# Patient Record
Sex: Female | Born: 1965 | Hispanic: No | Marital: Married | Smoking: Never smoker
Health system: Southern US, Community
[De-identification: ages and names within clinical notes are randomized; demographics above are authoritative.]

## PROBLEM LIST (undated history)

## (undated) DIAGNOSIS — K5090 Crohn's disease, unspecified, without complications: Secondary | ICD-10-CM

## (undated) DIAGNOSIS — R7303 Prediabetes: Secondary | ICD-10-CM

## (undated) DIAGNOSIS — K219 Gastro-esophageal reflux disease without esophagitis: Secondary | ICD-10-CM

## (undated) DIAGNOSIS — Z973 Presence of spectacles and contact lenses: Secondary | ICD-10-CM

## (undated) DIAGNOSIS — M8580 Other specified disorders of bone density and structure, unspecified site: Secondary | ICD-10-CM

## (undated) DIAGNOSIS — G43909 Migraine, unspecified, not intractable, without status migrainosus: Secondary | ICD-10-CM

## (undated) HISTORY — DX: Other specified disorders of bone density and structure, unspecified site: M85.80

## (undated) HISTORY — DX: Crohn's disease, unspecified, without complications: K50.90

## (undated) HISTORY — DX: Migraine, unspecified, not intractable, without status migrainosus: G43.909

---

## 2000-02-01 ENCOUNTER — Other Ambulatory Visit: Admission: RE | Admit: 2000-02-01 | Discharge: 2000-02-01 | Payer: Self-pay | Admitting: Obstetrics and Gynecology

## 2001-02-09 ENCOUNTER — Other Ambulatory Visit: Admission: RE | Admit: 2001-02-09 | Discharge: 2001-02-09 | Payer: Self-pay | Admitting: Obstetrics and Gynecology

## 2002-03-14 ENCOUNTER — Other Ambulatory Visit: Admission: RE | Admit: 2002-03-14 | Discharge: 2002-03-14 | Payer: Self-pay | Admitting: Obstetrics and Gynecology

## 2003-03-18 ENCOUNTER — Other Ambulatory Visit: Admission: RE | Admit: 2003-03-18 | Discharge: 2003-03-18 | Payer: Self-pay | Admitting: Obstetrics and Gynecology

## 2004-03-23 ENCOUNTER — Other Ambulatory Visit: Admission: RE | Admit: 2004-03-23 | Discharge: 2004-03-23 | Payer: Self-pay | Admitting: Obstetrics and Gynecology

## 2005-04-18 ENCOUNTER — Encounter: Admission: RE | Admit: 2005-04-18 | Discharge: 2005-07-17 | Payer: Self-pay | Admitting: Obstetrics and Gynecology

## 2005-10-11 ENCOUNTER — Inpatient Hospital Stay (HOSPITAL_COMMUNITY): Admission: AD | Admit: 2005-10-11 | Discharge: 2005-10-11 | Payer: Self-pay | Admitting: Obstetrics and Gynecology

## 2005-10-12 ENCOUNTER — Inpatient Hospital Stay (HOSPITAL_COMMUNITY): Admission: AD | Admit: 2005-10-12 | Discharge: 2005-10-14 | Payer: Self-pay | Admitting: Obstetrics & Gynecology

## 2005-10-18 ENCOUNTER — Inpatient Hospital Stay (HOSPITAL_COMMUNITY): Admission: AD | Admit: 2005-10-18 | Discharge: 2005-10-18 | Payer: Self-pay | Admitting: Obstetrics and Gynecology

## 2005-10-28 ENCOUNTER — Inpatient Hospital Stay (HOSPITAL_COMMUNITY): Admission: AD | Admit: 2005-10-28 | Discharge: 2005-10-31 | Payer: Self-pay | Admitting: Obstetrics and Gynecology

## 2006-03-07 ENCOUNTER — Ambulatory Visit: Payer: Self-pay

## 2006-06-12 ENCOUNTER — Ambulatory Visit: Payer: Self-pay | Admitting: Unknown Physician Specialty

## 2007-01-02 IMAGING — US US FETAL BPP W/O NONSTRESS
1 series · 13 of 28 positions shown · non-contrast
Comparison: none

CLINICAL DATA: High blood pressure.  Nonreactive NST.  Assess estimated fetal weight and fetal well-being.  G3 P0 TAB2.  Patient is 36 weeks 5 days by LMP dating.

[Series 1: us fetal bpp w/o nonstress · 0.29mm/px · 35 acquisitions, 13 frames shown]
[im 2/35]
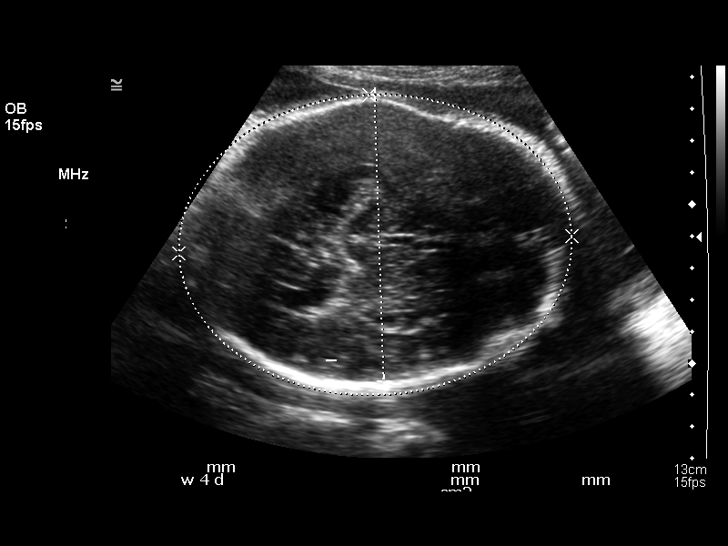
[im 4/35]
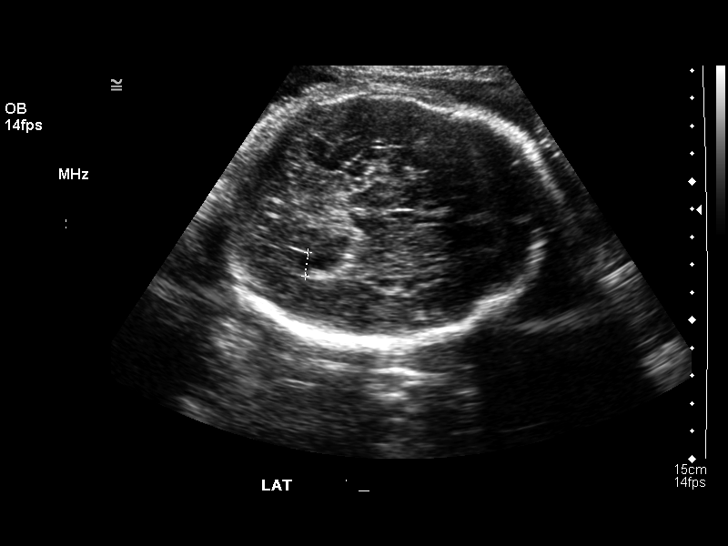
[im 7/35]
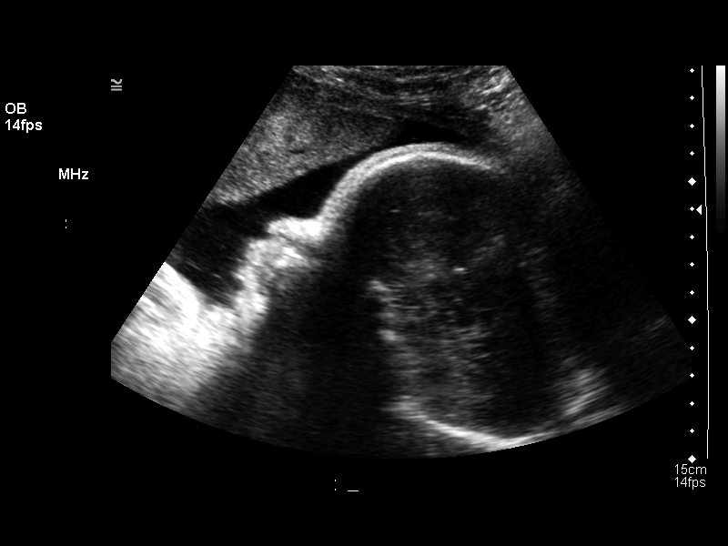
[im 9/35]
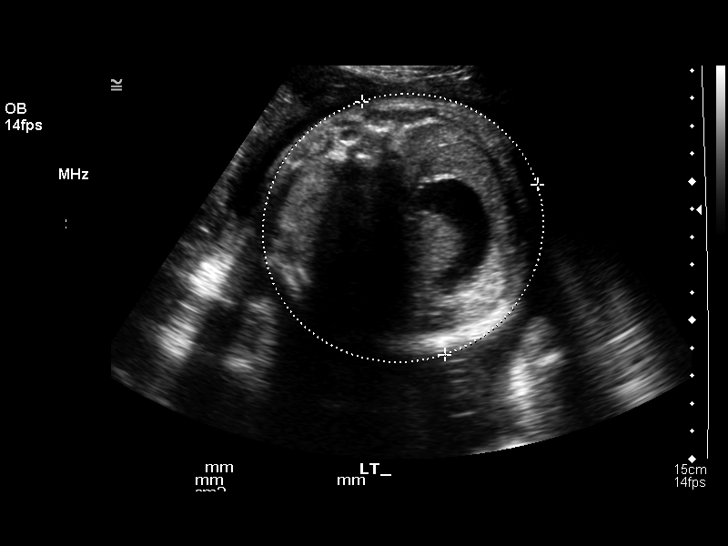
[im 12/35]
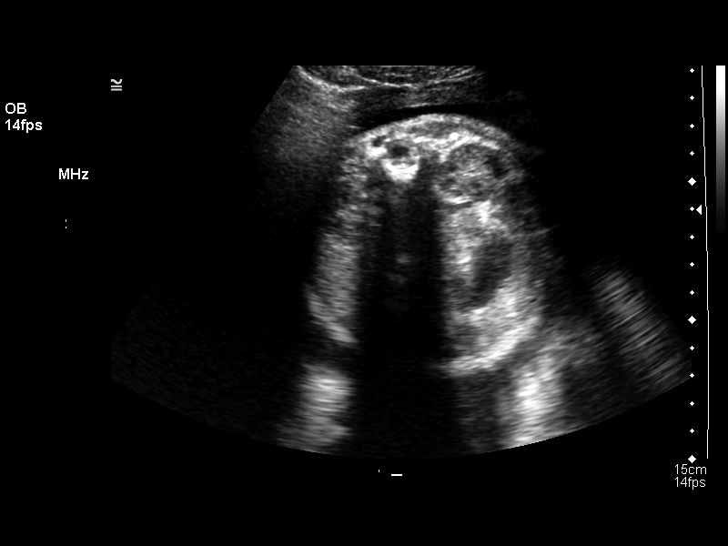
[im 14/35]
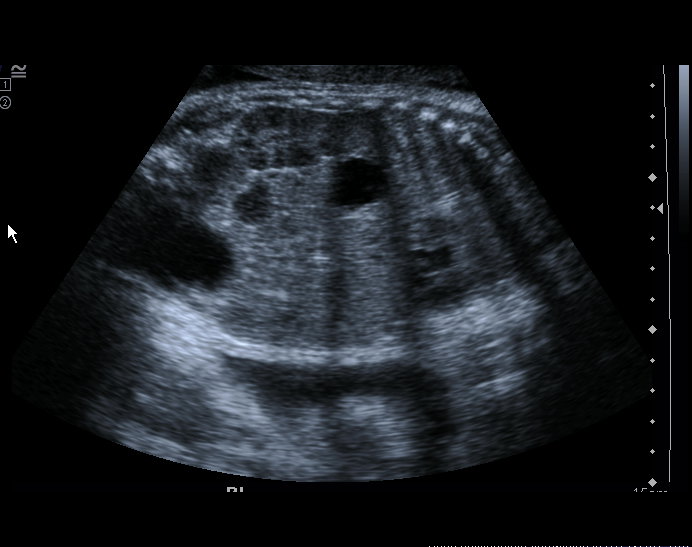
[im 18/35]
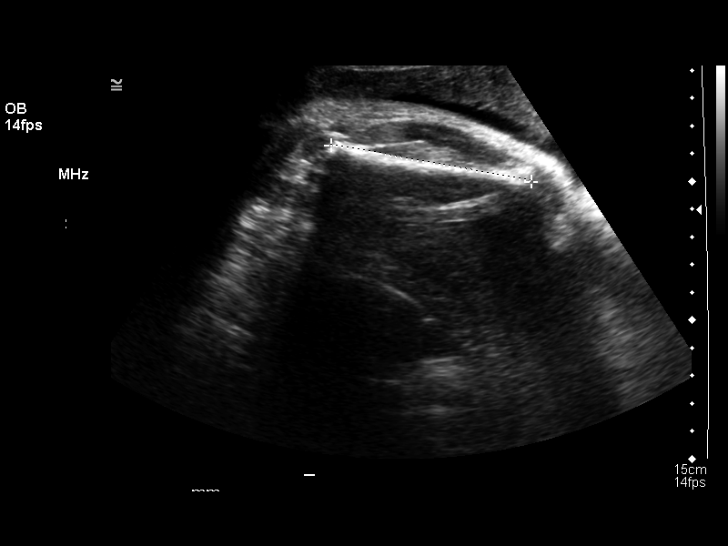
[im 21/35]
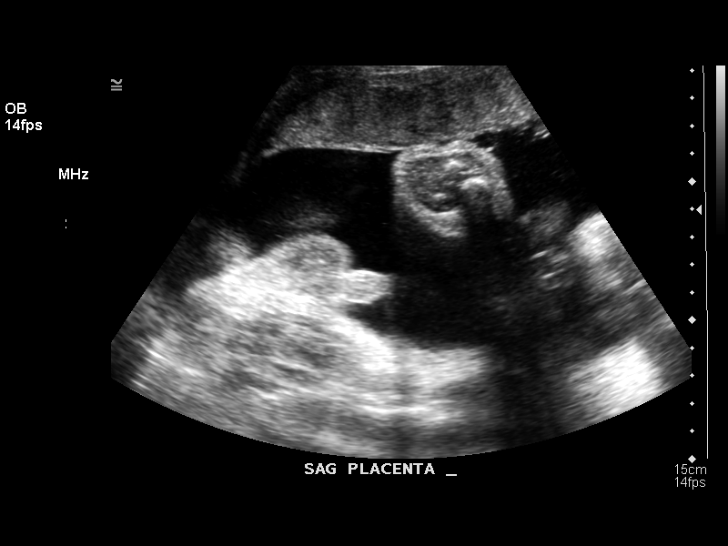
[im 23/35]
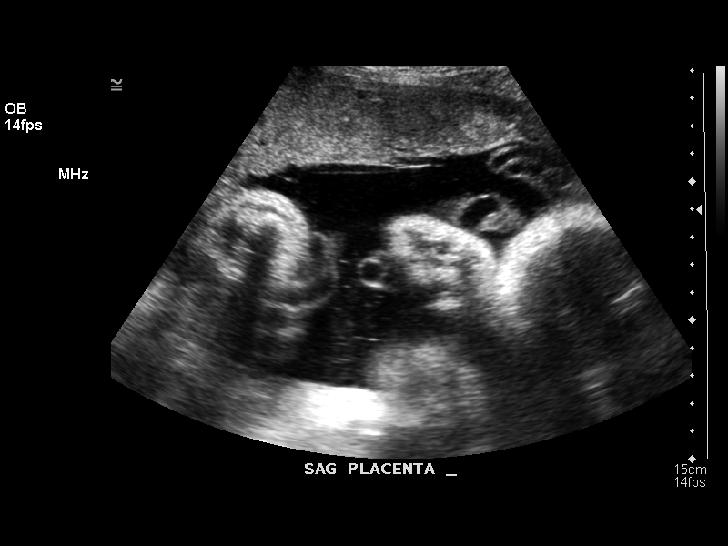
[im 26/35]
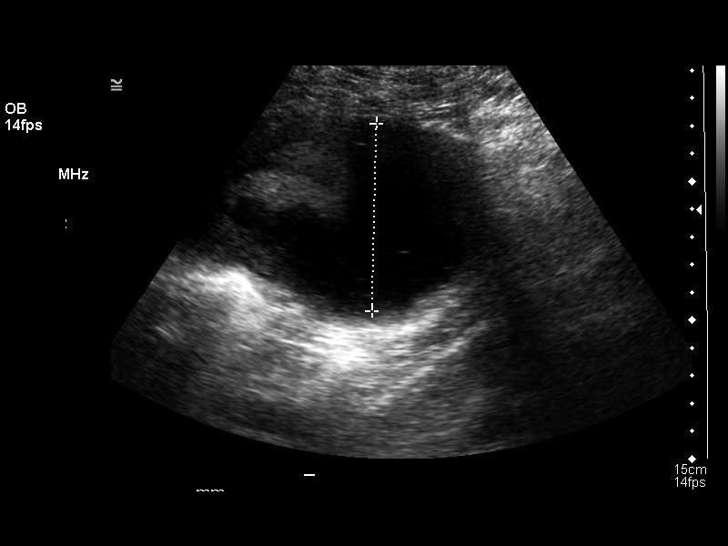
[im 28/35]
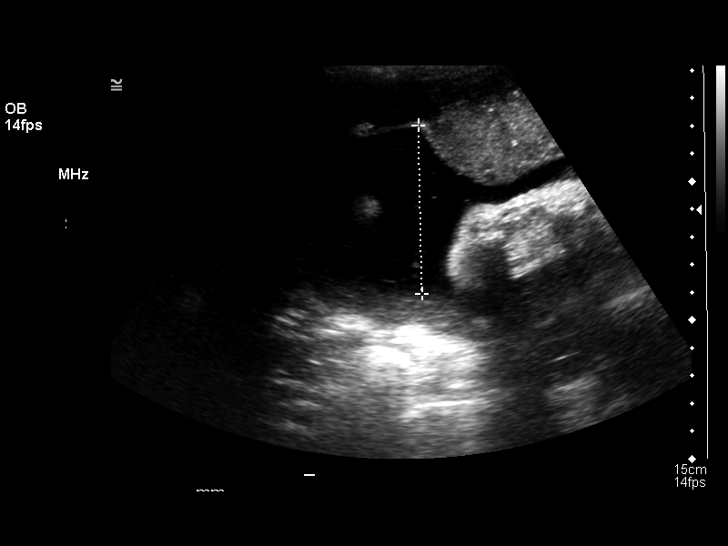
[im 31/35]
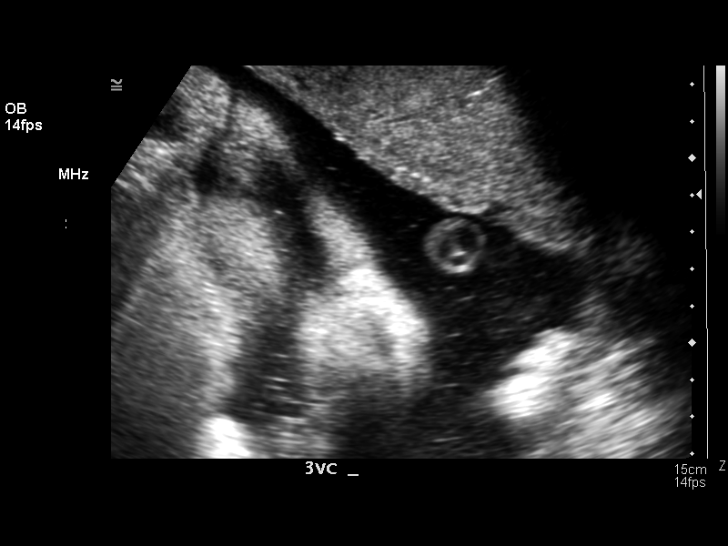
[im 33/35]
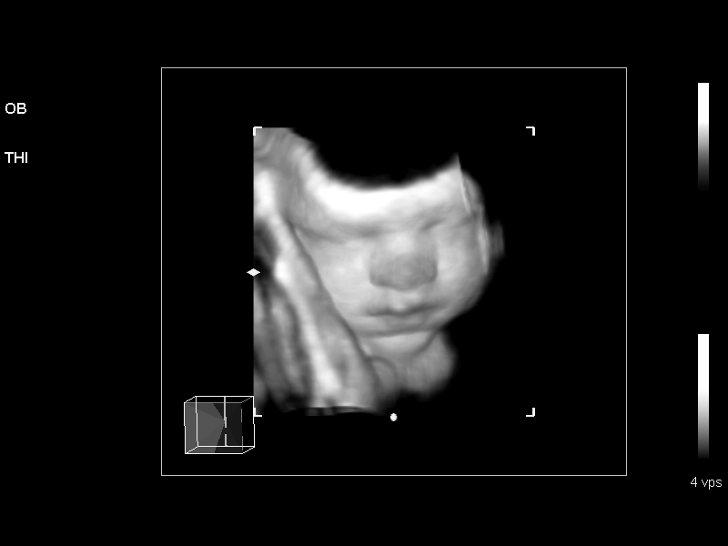

[13 of 28 positions shown; findings below may reference images not displayed]

OBSTETRICAL ULTRASOUND:
Number of Fetuses:  1
Heart Rate:  136
Movement:  Yes
Breathing:  Yes  
Presentation: Cephalic
Placental Location:  Anterior
Grade:  II
Previa: No
Amniotic Fluid (Subjective):  Upper normal
Amniotic Fluid (Objective):   23.1 cm AFI (5th -95th%ile = 7.5 ? 24.4 cm for 37 wks)

FETAL BIOMETRY
BPD:  9.1 cm   36 w 6 d
HC:  34.4 cm   34 w 6 d
AC:  31.5 cm   35 w 3 d
FL:  7.5 cm  38 w 2 d

MEAN GA:  37 w 4 d
LMP GA:                       36 w 5 d (assigned)

EFW: 8204 g (H) 50th ? 75th%ile (4882 ? 3499 g) For 37 wks

FETAL ANATOMY
Lateral Ventricles:    Visualized 
Thalami/CSP:      Visualized 
Posterior Fossa:  Not visualized 
Nuchal Region:    N/A
Spine:      Not visualized 
4 Chamber Heart on Left:      Not visualized 
Stomach on Left:      Visualized 
3 Vessel Cord:    Visualized 
Cord Insertion site:    Not visualized 
Kidneys:  Visualized 
Bladder:  Visualized 
Extremities:      Not visualized 

ADDITIONAL ANATOMY VISUALIZED:  Diaphragm.
Evaluation limited by:  Advanced gestational age and fetal positioning.

MATERNAL UTERINE AND ADNEXAL FINDINGS
Cervix:   Not evaluated.
IMPRESSION: 1.  Single living intrauterine fetus in cephalic presentation.  Patient is 36 weeks 5 days by LMP dating and measures 37 weeks 4 days today with a fetal weight of 8204 grams, falling between the 50th ? 75th percentile for 37 weeks.  Growth is appropriate.
2.  Upper normal amniotic fluid volume subjectively, with AFI of 23.1 cm.
3.  Limited anatomic survey due to late gestational age and fetal positioning.
BIOPHYSICAL PROFILE

Movement:  2    Time:  5 minutes
Breathing:  2
Tone:  2
Amniotic Fluid:  2

Total Score:  8
IMPRESSION: Biophysical profile score is [DATE] in 5 minutes of scanning.

## 2007-08-04 ENCOUNTER — Inpatient Hospital Stay (HOSPITAL_COMMUNITY): Admission: AD | Admit: 2007-08-04 | Discharge: 2007-08-06 | Payer: Self-pay | Admitting: Obstetrics and Gynecology

## 2007-12-27 HISTORY — PX: CHOLECYSTECTOMY: SHX55

## 2008-07-14 ENCOUNTER — Ambulatory Visit: Payer: Self-pay | Admitting: Gastroenterology

## 2008-07-16 ENCOUNTER — Ambulatory Visit: Payer: Self-pay | Admitting: Gastroenterology

## 2008-07-22 ENCOUNTER — Ambulatory Visit: Payer: Self-pay | Admitting: General Surgery

## 2008-07-29 ENCOUNTER — Ambulatory Visit: Payer: Self-pay | Admitting: General Surgery

## 2008-08-09 ENCOUNTER — Ambulatory Visit: Payer: Self-pay | Admitting: Surgery

## 2008-08-11 ENCOUNTER — Ambulatory Visit: Payer: Self-pay | Admitting: General Surgery

## 2008-10-14 ENCOUNTER — Ambulatory Visit: Payer: Self-pay | Admitting: Obstetrics and Gynecology

## 2008-10-16 ENCOUNTER — Ambulatory Visit: Payer: Self-pay | Admitting: Obstetrics and Gynecology

## 2008-12-23 ENCOUNTER — Ambulatory Visit: Payer: Self-pay | Admitting: Internal Medicine

## 2009-05-21 ENCOUNTER — Ambulatory Visit: Payer: Self-pay | Admitting: Unknown Physician Specialty

## 2009-10-22 ENCOUNTER — Ambulatory Visit: Payer: Self-pay | Admitting: Obstetrics and Gynecology

## 2010-10-28 ENCOUNTER — Ambulatory Visit: Payer: Self-pay | Admitting: Obstetrics and Gynecology

## 2010-11-08 ENCOUNTER — Other Ambulatory Visit: Payer: Self-pay | Admitting: Unknown Physician Specialty

## 2011-05-13 NOTE — Discharge Summary (Signed)
NAME:  Jennifer Sims, Jennifer Sims        ACCOUNT NO.:  [id number]   MEDICAL RECORD NO.:  [id number]          PATIENT TYPE:  INP   LOCATION:  9153                          FACILITY:  [facility]   PHYSICIAN:  Servando Salina, M.D.DATE OF BIRTH:  Jan 20, 1966   DATE OF ADMISSION:  10/12/2005  DATE OF DISCHARGE:  10/14/2005                                 DISCHARGE SUMMARY   ADMISSION DIAGNOSES:  1.  Pregnancy-induced hypertension.  2.  Intrauterine gestation at 36-6/7 weeks.  3.  Group B strep culture positive.  4.  Crohn's disease.  5.  Chronic headache.   DISCHARGE DIAGNOSES:  1.  Pregnancy-induced hypertension.  2.  Crohn's disease.  3.  Intrauterine gestation at 37+ weeks, undelivered.  4.  Chronic headache.   HISTORY OF PRESENT ILLNESS:  A 45 year old, gravida 3, para 0-0-2-0, married  black female, LMP of January 26, 2005, [facility] of November 03, 2005, now at 31-  6/7 weeks admitted for blood pressure management.  The patient was found to  have a blood pressure of 141/106, 151/101.  The patient had had [place] labs  that were normal on October 17 and October 18.  Urine total protein was less  than 6 mg in a 24-hour period.  The patient had history of chronic headaches  but denied any visual changes or epigastric pain.  Her prenatal course had  been unremarkable. Ultrasound at 36-5/7 weeks gave an estimated fetal weight  of 3072 g and upper normal amniotic fluid index.  The patient's blood type  is A positive.  She is rubella immune and her amniocentesis was normal  chromosomes.   HOSPITAL COURSE:  The patient was admitted to [facility].  She was  placed on continuous fetal monitoring.  Labetalol medication was used.  [place]  labs were noted on October 18 to show a platelet count of 244,000,  hematocrit 37.2, uric acid 4.8.  Creatinine-clearance was 140 ml/minute.  Urine total protein was less than 6.  The patient noted decrease in leg  swelling during her hospitalization.  [place] labs x2 had  been normal.  Therefore, were not repeated.  She was given labetalol 10 mg IV on initial  presentation and started on oral labetalol.  By hospital day #2/3, blood  pressures were stable on labetalol 100 mg p.o. b.i.d. Her extremities had  trace edema.  Her tracing was reactive with some irregular contractions.  Cervix had been long and closed.  It was thought that the patient could be  managed on an outpatient basis, and therefore, was discharged to home.   DISPOSITION:  Home.   CONDITION ON DISCHARGE:  Stable.   DISCHARGE MEDICATIONS:  1.  Asacol 800 mg daily.  2.  Prenatal vitamins one p.o. daily.  3.  Percocet 20 mg q.6h. p.r.n. pain.  4.  Flonase twice a day.  5.  Mucinex b.i.d.  6.  Labetolol 145m po bid  7.  ambien 10 mg po qhs prn   [facility]   DISCHARGE INSTRUCTIONS:  1.  PIH warning signs.  2.  Call for decreased fetal movement, spontaneous rupture of  membranes.  3.  Signs and symptoms of labor were discussed.      Servando Salina, M.D.  Electronically Signed     [place]/MED[facility]  D:  12/04/2005  T:  12/05/2005  Job:  [id number]

## 2011-10-10 LAB — CBC
HCT: 34.3 — ABNORMAL LOW
HCT: 34.9 — ABNORMAL LOW
HCT: 38.5
Hemoglobin: 11.6 — ABNORMAL LOW
Hemoglobin: 11.6 — ABNORMAL LOW
Hemoglobin: 13
MCHC: 33.2
MCHC: 33.8
MCHC: 33.9
MCV: 85.3
MCV: 86
MCV: 86.8
Platelets: 221
Platelets: 241
Platelets: 256
RBC: 3.99
RBC: 4.02
RBC: 4.51
RDW: 14.7 — ABNORMAL HIGH
RDW: 14.8 — ABNORMAL HIGH
RDW: 15 — ABNORMAL HIGH
WBC: 10.3
WBC: 7.4
WBC: 8.4

## 2011-10-10 LAB — COMPREHENSIVE METABOLIC PANEL
ALT: 12
ALT: 18
AST: 17
AST: 29
Albumin: 2.6 — ABNORMAL LOW
Albumin: 2.7 — ABNORMAL LOW
Alkaline Phosphatase: 118 — ABNORMAL HIGH
Alkaline Phosphatase: 89
BUN: 3 — ABNORMAL LOW
BUN: 5 — ABNORMAL LOW
CO2: 21
CO2: 28
Calcium: 8.5
Calcium: 8.9
Chloride: 104
Chloride: 107
Creatinine, Ser: 0.67
Creatinine, Ser: 0.71
GFR calc Af Amer: >60
GFR calc Af Amer: >60
GFR calc non Af Amer: >60
GFR calc non Af Amer: >60
Glucose, Bld: 90
Glucose, Bld: 96
Potassium: 3.7
Potassium: 3.8
Sodium: 135
Sodium: 135
Total Bilirubin: 0.4
Total Bilirubin: 0.6
Total Protein: 5.8 — ABNORMAL LOW
Total Protein: 6

## 2011-10-10 LAB — URIC ACID
Uric Acid, Serum: 5.4
Uric Acid, Serum: 5.5

## 2011-10-10 LAB — LACTATE DEHYDROGENASE
LDH: 120
LDH: 178

## 2011-10-10 LAB — URINALYSIS, DIPSTICK ONLY
Bilirubin Urine: NEGATIVE
Glucose, UA: NEGATIVE
Hgb urine dipstick: NEGATIVE
Ketones, ur: NEGATIVE
Leukocytes, UA: NEGATIVE
Nitrite: NEGATIVE
Protein, ur: NEGATIVE
Specific Gravity, Urine: <1.005 — ABNORMAL LOW
Urobilinogen, UA: 0.2
pH: 7

## 2011-10-10 LAB — CCBB MATERNAL DONOR DRAW

## 2011-10-10 LAB — RPR: RPR Ser Ql: NONREACTIVE

## 2011-12-21 ENCOUNTER — Ambulatory Visit: Payer: Self-pay | Admitting: Obstetrics and Gynecology

## 2012-12-11 ENCOUNTER — Ambulatory Visit: Payer: Self-pay | Admitting: Obstetrics and Gynecology

## 2012-12-12 ENCOUNTER — Ambulatory Visit: Payer: Self-pay | Admitting: Obstetrics and Gynecology

## 2013-05-24 ENCOUNTER — Ambulatory Visit: Payer: Self-pay | Admitting: Obstetrics and Gynecology

## 2013-12-12 ENCOUNTER — Ambulatory Visit: Payer: Self-pay | Admitting: Obstetrics and Gynecology

## 2015-06-11 DIAGNOSIS — K219 Gastro-esophageal reflux disease without esophagitis: Secondary | ICD-10-CM | POA: Insufficient documentation

## 2015-10-14 ENCOUNTER — Other Ambulatory Visit: Payer: Self-pay | Admitting: Neurology

## 2015-10-14 DIAGNOSIS — G43119 Migraine with aura, intractable, without status migrainosus: Secondary | ICD-10-CM

## 2015-10-26 ENCOUNTER — Ambulatory Visit: Payer: Self-pay

## 2015-11-05 ENCOUNTER — Ambulatory Visit
Admission: RE | Admit: 2015-11-05 | Discharge: 2015-11-05 | Disposition: A | Discharge status: Home / Self Care | Payer: BC Managed Care – PPO | Source: Ambulatory Visit | Attending: Neurology | Admitting: Neurology

## 2015-11-05 DIAGNOSIS — G43119 Migraine with aura, intractable, without status migrainosus: Secondary | ICD-10-CM | POA: Insufficient documentation

## 2015-11-05 MED ORDER — GADOBENATE DIMEGLUMINE 529 MG/ML IV SOLN
10.0000 mL | Freq: Once | INTRAVENOUS | Status: AC | PRN
  Administered 2015-11-05: 10 mL via INTRAVENOUS

## 2016-05-31 DIAGNOSIS — E559 Vitamin D deficiency, unspecified: Secondary | ICD-10-CM | POA: Insufficient documentation

## 2016-07-21 ENCOUNTER — Ambulatory Visit: Payer: BC Managed Care – PPO | Admitting: Neurology

## 2016-08-02 ENCOUNTER — Encounter: Payer: Self-pay | Admitting: Neurology

## 2016-08-02 ENCOUNTER — Ambulatory Visit: Payer: BC Managed Care – PPO | Admitting: Neurology

## 2016-08-02 VITALS — BP 122/86 | HR 82 | Ht 62.75 in | Wt 132.2 lb

## 2016-08-02 DIAGNOSIS — Z79899 Other long term (current) drug therapy: Secondary | ICD-10-CM

## 2016-08-02 DIAGNOSIS — G43709 Chronic migraine without aura, not intractable, without status migrainosus: Secondary | ICD-10-CM | POA: Diagnosis not present

## 2016-08-02 DIAGNOSIS — G43711 Chronic migraine without aura, intractable, with status migrainosus: Secondary | ICD-10-CM | POA: Diagnosis not present

## 2016-08-02 MED ORDER — ELETRIPTAN HYDROBROMIDE 40 MG PO TABS: 40.0000 mg | ORAL_TABLET | Freq: Once | ORAL | 12 refills | Status: DC

## 2016-08-02 MED ORDER — DIHYDROERGOTAMINE MESYLATE 4 MG/ML NA SOLN: 1.0000 | NASAL | 12 refills | Status: DC | PRN

## 2016-08-02 MED ORDER — DULOXETINE HCL 30 MG PO CPEP: 30.0000 mg | ORAL_CAPSULE | Freq: Every day | ORAL | 12 refills | Status: DC

## 2016-08-02 NOTE — Patient Instructions (Addendum)
Remember to drink plenty of fluid, eat healthy meals and do not skip any meals. Try to eat protein with a every meal and eat a healthy snack such as fruit or nuts in between meals. Try to keep a regular sleep-wake schedule and try to exercise daily, particularly in the form of walking, 20-30 minutes a day, if you can.   As far as your medications are concerned, I would like to suggest: Continue Topamax Recommend Botox for Migraines DHE one spray in each nostril as needed. No more than 4 in one hour or daily. Do not take within 24 hours of a Triptan. Relpax take one at onset. May repeat once in 2 hours. No more than 2 in 24 hours or 2-3 days a week  As far as diagnostic testing: Labs  Our phone number is [phone]. We also have an after hours call service for urgent matters and there is a physician on-call for urgent questions. For any emergencies you know to call 911 or go to the nearest emergency room  Eletriptan tablets What is this medicine? ELETRIPTAN (el ih TRIP tan) is used to treat migraines with or without aura. An aura is a strange feeling or visual disturbance that warns you of an attack. It is not used to prevent migraines. This medicine may be used for other purposes; ask your health care provider or pharmacist if you have questions. What should I tell my health care provider before I take this medicine? They need to know if you have any of these conditions: -bowel disease or colitis -diabetes -family history of heart disease -fast or irregular heart beat -heart or blood vessel disease, angina (chest pain), or previous heart attack -high blood pressure -high cholesterol -history of stroke, transient ischemic attacks (TIAs or mini-strokes), or intracranial bleeding -kidney or liver disease -overweight -poor circulation -postmenopausal or surgical removal of uterus and ovaries -Raynaud's disease -seizure disorder -an unusual or allergic reaction to eletriptan, other  medicines, foods, dyes, or preservatives -pregnant or trying to get pregnant -breast-feeding How should I use this medicine? Take this medicine by mouth with a glass of water. Follow the directions on the prescription label. This medicine is taken at the first symptoms of a migraine. It is not for everyday use. If your migraine headache returns after one dose, you can take another dose as directed. You must leave at least 2 hours between doses, and do not take more than 40 mg as a single dose. Do not take more than 80 mg total in any 24 hour period. If there is no improvement at all after the first dose, do not take a second dose without talking to your doctor or health care professional. Do not take your medicine more often than directed. Talk to your pediatrician regarding the use of this medicine in children. Special care may be needed. Overdosage: If you think you have taken too much of this medicine contact a poison control center or emergency room at once. NOTE: This medicine is only for you. Do not share this medicine with others. What if I miss a dose? This does not apply; this medicine is not for regular use. What may interact with this medicine? Do not take this medicine with any of the following medications: -amiodarone -amphetamine, dextroamphetamine, or cocaine -aprepitant -certain antibiotics like clarithromycin, erythromycin, troleandomycin -cimetidine -conivaptan -dalfopristin; quinupristin -dihydroergotamine, ergotamine, ergoloid mesylates, methysergide, or ergot-type medication - do not take within 24 hours of taking eletriptan. -diltiazem -feverfew -imatinib -medicines for fungal infections  like fluconazole, itraconazole, ketoconazole, and voriconazole -medicines for HIV, AIDS -medicines for mental depression like fluvoxamine and nefazodone -mifepristone -other migraine medicines like almotriptan, sumatriptan, naratriptan, rizatriptan, zolmitriptan - do not take within  24 hours of taking eletriptan. -tryptophan -verapamil This medicine may also interact with the following medications: -medicines for mental depression, anxiety or mood problems This list may not describe all possible interactions. Give your health care provider a list of all the medicines, herbs, non-prescription drugs, or dietary supplements you use. Also tell them if you smoke, drink alcohol, or use illegal drugs. Some items may interact with your medicine. What should I watch for while using this medicine? Only take this medicine for a migraine headache. Take it if you get warning symptoms or at the start of a migraine attack. It is not for regular use to prevent migraine attacks. You may get drowsy or dizzy. Do not drive, use machinery, or do anything that needs mental alertness until you know how this medicine affects you. To reduce dizzy or fainting spells, do not sit or stand up quickly, especially if you are an older patient. Alcohol can increase drowsiness, dizziness and flushing. Avoid alcoholic drinks. Smoking cigarettes may increase the risk of heart-related side effects from using this medicine. If you take migraine medicines for 10 or more days a month, your migraines may get worse. Keep a diary of headache days and medicine use. Contact your healthcare professional if your migraine attacks occur more frequently. What side effects may I notice from receiving this medicine? Side effects that you should report to your doctor or health care professional as soon as possible: -allergic reactions like skin rash, itching or hives, swelling of the face, lips, or tongue -fast, slow, or irregular heart beat -increased or decreased blood pressure -seizures -severe stomach pain and cramping, bloody diarrhea -signs and symptoms of a blood clot such as breathing problems; changes in vision; chest pain; severe, sudden headache; pain, swelling, warmth in the leg; trouble speaking; sudden numbness or  weakness of the face, arm or leg -tingling, pain, or numbness in the face, hands, or feet Side effects that usually do not require medical attention (report to your doctor or health care professional if they continue or are bothersome): -drowsiness -feeling warm, flushing, or redness of the face -headache -muscle cramps, pain -nausea, vomiting -unusually weak or tired This list may not describe all possible side effects. Call your doctor for medical advice about side effects. You may report side effects to FDA at 1-800-FDA-1088. Where should I keep my medicine? Keep out of the reach of children. Store at room temperature between 15 and 30 degrees C (59 and 86 degrees F). Throw away any unused medicine after the expiration date. NOTE: This sheet is a summary. It may not cover all possible information. If you have questions about this medicine, talk to your doctor, pharmacist, or health care provider.    2016, [employer]. (2013-08-13 10:22:32)   Dihydroergotamine nasal spray What is this medicine? DIHYDROERGOTAMINE (dye hye droe er GOT a meen) is part of a group of medicines called ergot alkaloids. It is used to treat migraine headaches with or without aura. It should not be used to prevent migraine headaches. This medicine may be used for other purposes; ask your health care provider or pharmacist if you have questions. What should I tell my health care provider before I take this medicine? They need to know if you have any of these conditions: -chest pain or difficulty breathing -  heart or blood vessel disease -high blood pressure -infection -kidney disease -liver disease -poor circulation -risk factors for heart disease such as smoking, high cholesterol, a family history of heart disease, or if you are postmenopausal or a female over 39 years of age -an unusual or allergic reaction to dihydroergotamine, ergot alkaloids, other medicines, foods, dyes, or  preservatives -pregnant or trying to get pregnant -breast-feeding How should I use this medicine? This medicine is for use in the nose. Follow the directions on the prescription label. This medicine is given at the first symptoms of a migraine. It is not for everyday use. You must prepare the nasal spray only when you are ready to use it. Follow the instructions that come with your prescription or contact your doctor or health care professional if you are unsure how to do this. Throw away the sprayer after completing the full dose. Each unit is only good for eight hours once opened. Do not use this medicine more often than directed. Talk to your pediatrician regarding the use of this medicine in children. Special care may be needed. Overdosage: If you think you have taken too much of this medicine contact a poison control center or emergency room at once. NOTE: This medicine is only for you. Do not share this medicine with others. What if I miss a dose? This does not apply; this medicine is not for regular use. What may interact with this medicine? Do not take this medicine with any of the following medications: -antifungal drugs like fluconazole, itraconazole, ketoconazole or voriconazole -certain antibiotics like erythromycin, clarithromycin, and troleandomycin -cocaine -conivaptan -dexfenfluramine -ephedrine -feverfew -grapefruit juice -imatinib -isoproterenol -medicines called nitrates like isosorbide and nitroglycerin -medicines for colds, flu, or breathing difficulties like phenylephrine and pseudoephedrine -medicines for migraine headache like almotriptan, eletriptan, frovatriptan, naratriptan, rizatriptan, sumatriptan, and zolmitriptan -midodrine -nefazodone -other ergot alkaloids like bromocriptine, cabergoline, dihydroergotamine, ergoloid mesylates, ergonovine, methylergonovine, and methysergide -some medicines for HIV This medicine may also interact with the following  medications: -clotrimazole -fluoxetine -fluvoxamine -medicines for high blood pressure, especially beta-blockers -metronidazole -nicotine -zileuton This list may not describe all possible interactions. Give your health care provider a list of all the medicines, herbs, non-prescription drugs, or dietary supplements you use. Also tell them if you smoke, drink alcohol, or use illegal drugs. Some items may interact with your medicine. What should I watch for while using this medicine? Check with your doctor or health care professional if you do not get relief from your headaches after using this medicine. You may need to be changed to a different kind of medicine to treat your migraines. You may get drowsy or dizzy. Do not drive, use machinery, or do anything that needs mental alertness until you know how this medicine affects you. To reduce dizzy or fainting spells, do not sit or stand up quickly, especially if you are an older patient. Alcohol can increase drowsiness, dizziness and flushing. Avoid alcoholic drinks. This medicine decreases the circulation of blood to your skin, fingers, and toes. You may get more sensitive to the cold. Elderly patients are more likely to feel this effect. Dress warmly and avoid long exposure to the cold. What side effects may I notice from receiving this medicine? Side effects that you should report to your doctor or health care professional as soon as possible: -allergic reactions like skin rash, itching or hives, swelling of the face, lips, or tongue -chest pain -cold hands or feet -fast, irregular heartbeat -leg or arm pain, cramps -swelling of  hands, ankles, or feet -tingling, pain or numbness in feet or hands -vomiting -weakness in legs Side effects that usually do not require medical attention (report to your doctor or health care professional if they continue or are bothersome): -changes in the taste of food -nasal congestion or sore  throat -nausea This list may not describe all possible side effects. Call your doctor for medical advice about side effects. You may report side effects to FDA at 1-800-FDA-1088. Where should I keep my medicine? Keep out of the reach of children. Store at room temperature below 25 degrees C (77 degrees F). Protect from light, moisture, and heat. Do not refrigerate or freeze. Keep the parts of the nasal spray in the tray provided. Keep this tray loaded in the assembly case. Do not keep an opened nasal spray for more than 8 hours. Throw away any unopened medicine after the expiration date. NOTE: This sheet is a summary. It may not cover all possible information. If you have questions about this medicine, talk to your doctor, pharmacist, or health care provider.    2016, [employer]. (2008-04-01 17:17:14)   Duloxetine delayed-release capsules What is this medicine? DULOXETINE (doo LOX e teen) is used to treat depression, anxiety, and different types of chronic pain. This medicine may be used for other purposes; ask your health care provider or pharmacist if you have questions. What should I tell my health care provider before I take this medicine? They need to know if you have any of these conditions: -bipolar disorder or a family history of bipolar disorder -glaucoma -kidney disease -liver disease -suicidal thoughts or a previous suicide attempt -taken medicines called MAOIs like Carbex, Eldepryl, Marplan, Nardil, and Parnate within 14 days -an unusual reaction to duloxetine, other medicines, foods, dyes, or preservatives -pregnant or trying to get pregnant -breast-feeding How should I use this medicine? Take this medicine by mouth with a glass of water. Follow the directions on the prescription label. Do not cut, crush or chew this medicine. You can take this medicine with or without food. Take your medicine at regular intervals. Do not take your medicine more often than  directed. Do not stop taking this medicine suddenly except upon the advice of your doctor. Stopping this medicine too quickly may cause serious side effects or your condition may worsen. A special MedGuide will be given to you by the pharmacist with each prescription and refill. Be sure to read this information carefully each time. Talk to your pediatrician regarding the use of this medicine in children. While this drug may be prescribed for children as young as 24 years of age for selected conditions, precautions do apply. Overdosage: If you think you have taken too much of this medicine contact a poison control center or emergency room at once. NOTE: This medicine is only for you. Do not share this medicine with others. What if I miss a dose? If you miss a dose, take it as soon as you can. If it is almost time for your next dose, take only that dose. Do not take double or extra doses. What may interact with this medicine? Do not take this medicine with any of the following medications: -certain diet drugs like dexfenfluramine, fenfluramine -desvenlafaxine -linezolid -MAOIs like Azilect, Carbex, Eldepryl, Marplan, Nardil, and Parnate -methylene blue (intravenous) -milnacipran -thioridazine -venlafaxine This medicine may also interact with the following medications: -alcohol -aspirin and aspirin-like medicines -certain antibiotics like ciprofloxacin and enoxacin -certain medicines for blood pressure, heart disease, irregular heart beat -  certain medicines for depression, anxiety, or psychotic disturbances -certain medicines for migraine headache like almotriptan, eletriptan, frovatriptan, naratriptan, rizatriptan, sumatriptan, zolmitriptan -certain medicines that treat or prevent blood clots like warfarin, enoxaparin, and dalteparin -cimetidine -fentanyl -lithium -NSAIDS, medicines for pain and inflammation, like ibuprofen or naproxen -phentermine -procarbazine -sibutramine -[facility]  wort -theophylline -tramadol -tryptophan This list may not describe all possible interactions. Give your health care provider a list of all the medicines, herbs, non-prescription drugs, or dietary supplements you use. Also tell them if you smoke, drink alcohol, or use illegal drugs. Some items may interact with your medicine. What should I watch for while using this medicine? Tell your doctor if your symptoms do not get better or if they get worse. Visit your doctor or health care professional for regular checks on your progress. Because it may take several weeks to see the full effects of this medicine, it is important to continue your treatment as prescribed by your doctor. Patients and their families should watch out for new or worsening thoughts of suicide or depression. Also watch out for sudden changes in feelings such as feeling anxious, agitated, panicky, irritable, hostile, aggressive, impulsive, severely restless, overly excited and hyperactive, or not being able to sleep. If this happens, especially at the beginning of treatment or after a change in dose, call your health care professional. Dennis Bast may get drowsy or dizzy. Do not drive, use machinery, or do anything that needs mental alertness until you know how this medicine affects you. Do not stand or sit up quickly, especially if you are an older patient. This reduces the risk of dizzy or fainting spells. Alcohol may interfere with the effect of this medicine. Avoid alcoholic drinks. This medicine can cause an increase in blood pressure. This medicine can also cause a sudden drop in your blood pressure, which may make you feel faint and increase the chance of a fall. These effects are most common when you first start the medicine or when the dose is increased, or during use of other medicines that can cause a sudden drop in blood pressure. Check with your doctor for instructions on monitoring your blood pressure while taking this medicine. Your  mouth may get dry. Chewing sugarless gum or sucking hard candy, and drinking plenty of water may help. Contact your doctor if the problem does not go away or is severe. What side effects may I notice from receiving this medicine? Side effects that you should report to your doctor or health care professional as soon as possible: -allergic reactions like skin rash, itching or hives, swelling of the face, lips, or tongue -changes in blood pressure -confusion -dark urine -dizziness -fast talking and excited feelings or actions that are out of control -fast, irregular heartbeat -fever -general ill feeling or flu-like symptoms -hallucination, loss of contact with reality -light-colored stools -loss of balance or coordination -redness, blistering, peeling or loosening of the skin, including inside the mouth -right upper belly pain -seizures -suicidal thoughts or other mood changes -trouble concentrating -trouble passing urine or change in the amount of urine -unusual bleeding or bruising -unusually weak or tired -yellowing of the eyes or skin Side effects that usually do not require medical attention (report to your doctor or health care professional if they continue or are bothersome): -blurred vision -change in appetite -change in sex drive or performance -headache -increased sweating -nausea This list may not describe all possible side effects. Call your doctor for medical advice about side effects. You may report  side effects to FDA at 1-800-FDA-1088. Where should I keep my medicine? Keep out of the reach of children. Store at room temperature between 20 and 25 degrees C (68 to 77 degrees F). Throw away any unused medicine after the expiration date. NOTE: This sheet is a summary. It may not cover all possible information. If you have questions about this medicine, talk to your doctor, pharmacist, or health care provider.    2016, [employer]. (2013-12-03 92:76:39)

## 2016-08-02 NOTE — Progress Notes (Addendum)
[facility]    Provider:  Dr Jaynee Eagles Referring Provider: Servando Salina MD Primary Care Physician:  Idelle Crouch, MD  CC:  migraine  HPI:  Jennifer Sims is a 50 y.o. female here as a referral from Dr. Doy Hutching for migraine. PMHx migraines, osteopenia, crohn's disease, HTN. She has had migraines for 40 years without known inciting event. She is on Topamax. She tried multiple medications. She is on Topamax 134m twice a day. For the last 6 months her migraines have been worsening  . She has seen 5-6 neurologists. [facility] The migraines can last days. She has light sensitivity, sound sensitivity, She has at least 15 headache days a month (for last 6 months) of which 10 or more are migraines. They are unilateral and pounding and throbbing. They are right-sided. She has nause. No medication overuse. Worst are on the right side. Also on the top of the head or the back of the head. No auras. Has Nausea. No vomiting. She can feel the migraine coming. They are moderate in pain. Laying down in the dark helps. No vision changes. She is having side effects to the Topamax. No other focal neurologic deficits. No FHx migraines. No known triggers. Can last days. Has tried multiple medications nothing helps. No medication overuse.  They can last up to 24 hours or longer.   Reviewed notes, labs and imaging from outside physicians, which showed:  Medications tried: Topiramate(not trokendi), roboflavin, fioricet, zomig, imitrex PO and injectable, Triptans gave rebound headaches (but not because of overuse), excedrin with rebound, tylenol pm with rebound, ibuprofen worsen headches, Topamax, magox, Roboflavin, tried Cambia. The only thing that helps with migraines is Fioricet. Nortriptyline did not help. Has tired Migranal and blood pressure medications (propranolol) in the past.  MRI of the brain 10/2015 personally reviewed and agree with the  following:  IMPRESSION: There may be slight atrophy. No acute intracranial findings are noted.  27 x 26 x 10 mm CSF like collection over the RIGHT parietal convexity with slight remodeling of the skull. Arachnoid cyst is favored. Relationship to the patient's headaches is unclear.  CBC 05/31/2016 normal CMP 05/31/2016 normal with creatinine 0.8   [facility]. At that time she was started on Nortriptyline 130m Discussed beta-blockers which can cause fatigue and changes in BP so they di dnot pursue that option. They discussed riboflavin. Started 40045mwice daily. They discussed trying sphenocath.   Migraine with aura: headache began at child hood, patient was diagnosed around with migraine headache in 2000. Patient has dull aching and pain behind the eyes.[t has headache all over her head sometimes. Patient typically has 2 to 3 headache a week. Patient says she use to get more headache a week. Patient has nausea, vomiting, irritability, light and noise sensitivity associated with her headaches. Patient is sensitive to cologne. Patient can tell she is going to get headache she gets pressure in the head, the pressure increases. Lack of sleep can trigger he headache. Sleep helps makes the headache better. Patient says it takes several hours for headache to reach its worse after onset. Patient has family history of migraines in her father and sister. She has eliminated caffeine more sleep, cut back on chocolate. Patient sleep 6 hours a night. Per husband she snores sometimes. She feels sleepy and tired during the day. She has had changes with vision and menstrual cycle. Patient denies TMJ problems . Patient does have  shoulder pain. She carries a heavy purse and lots of books.  MRI Brain: 10/2015 minimal atrophy and right parietal arachnoid cyst. Migraine Buddy Preventative Topamax 100 two times a day (bad taste - Vit C, offered trokendi) , Magnesium,  Riboflavin Rescue: excedrin migraine, Zomig, Imitrex (rebound headache), baclofen for headache relief. Aleve (in stead of cambia) + phenergan, cambia-rebound headache, aleve-rebound headache, Maxalt, fioricet   Review of Systems: Patient complains of symptoms per HPI as well as the following symptoms:No CP, no SOB . Pertinent negatives per HPI. All others negative.   Social History   Social History  . Marital status: Married    Spouse name: [name]  . Number of children: 2  . Years of education: PHD JD   Occupational History  . University     Social History Main Topics  . Smoking status: Never Smoker  . Smokeless tobacco: Never Used  . Alcohol use Yes     Comment: 1 drink per month  . Drug use: No  . Sexual activity: Not on file   Other Topics Concern  . Not on file   Social History Narrative   Lives with husband and children   Caffeine use: 1 cup tea per day    Family History  Problem Relation Age of Onset  . Diabetes Mellitus II Mother   . Hypertension Mother   . Asthma Father   . Osteoporosis Maternal Grandmother     Past Medical History:  Diagnosis Date  . Crohn's disease ([place])   . Migraine   . Osteopenia     Past Surgical History:  Procedure Laterality Date  . CHOLECYSTECTOMY  2009    Current Outpatient Prescriptions  Medication Sig Dispense Refill  . butalbital-acetaminophen-caffeine (FIORICET, ESGIC) 50-325-40 MG tablet Take 1 tablet by mouth as needed for headache.    . Calcium Carbonate Antacid (TUMS PO) Take 2,000 mg by mouth daily.    . Cholecalciferol (VITAMIN D3) 1000 units CAPS Take 1 capsule by mouth daily.    Marland Kitchen docusate sodium (COLACE) 100 MG capsule Take 100 mg by mouth daily.    . fexofenadine (ALLEGRA) 180 MG tablet Take 180 mg by mouth daily.    . hydrochlorothiazide (MICROZIDE) 12.5 MG capsule Take 12.5 mg by mouth daily.    . magnesium oxide (MAG-OX) 400 MG tablet Take 400 mg by mouth 2 (two) times daily.    Marland Kitchen MINOXIDIL PO Take by  mouth 2 (two) times daily.    . Multiple Vitamins-Minerals (MULTIVITAMIN ADULTS PO) Take 1 tablet by mouth daily.    . Norethin Ace-Eth Estrad-FE (LOESTRIN 24 FE PO) Take 1 tablet by mouth daily.    . Potassium Gluconate 550 MG TABS Take 1 tablet by mouth daily.    . promethazine (PHENERGAN) 12.5 MG tablet Take 12.5 mg by mouth every 6 (six) hours as needed for nausea or vomiting.    Marland Kitchen RIBOFLAVIN PO Take 200 mg by mouth daily.    Marland Kitchen topiramate (TOPAMAX) 100 MG tablet Take 100 mg by mouth 2 (two) times daily.    . Tretinoin-Cleanser-Moisturizer 0.025 % CREAM KIT Apply topically daily.    Marland Kitchen UNABLE TO FIND 1 Dose 2 (two) times daily. Med Name: 2.4 gm    . vitamin B-12 (CYANOCOBALAMIN) 1000 MCG tablet Take 1,000 mcg by mouth daily.    . vitamin C (ASCORBIC ACID) 500 MG tablet Take 500 mg by mouth daily.    Marland Kitchen dihydroergotamine (MIGRANAL) 4 MG/ML nasal spray Place 1 spray into the nose as  needed for migraine. Use in one nostril as directed.  No more than 4 sprays in one hour 8 mL 12  . DULoxetine (CYMBALTA) 30 MG capsule Take 1 capsule (30 mg total) by mouth daily. 30 capsule 12  . eletriptan (RELPAX) 40 MG tablet Take 1 tablet (40 mg total) by mouth once. May repeat in 2 hours if headache persists or recurs. 10 tablet 12   No current facility-administered medications for this visit.     Allergies as of 08/02/2016 - Review Complete 08/02/2016  Allergen Reaction Noted  . Sulfur Nausea Only 07/16/2014    Vitals: BP 122/86   Pulse 82   Ht 5' 2.75" (1.594 m)   Wt 132 lb 3.2 oz (60 kg)   BMI 23.61 kg/m  Last Weight:  Wt Readings from Last 1 Encounters:  08/02/16 132 lb 3.2 oz (60 kg)   Last Height:   Ht Readings from Last 1 Encounters:  08/02/16 5' 2.75" (1.594 m)   Physical exam: Exam: Gen: NAD, conversant, well nourised, well groomed                     CV: RRR, no MRG. No Carotid Bruits. No peripheral edema, warm, nontender Eyes: Conjunctivae clear without exudates or  hemorrhage  Neuro: Detailed Neurologic Exam  Speech:    Speech is normal; fluent and spontaneous with normal comprehension.  Cognition:    The patient is oriented to person, place, and time;     recent and remote memory intact;     language fluent;     normal attention, concentration,     fund of knowledge Cranial Nerves:    The pupils are equal, round, and reactive to light. The fundi are normal and spontaneous venous pulsations are present. Visual fields are full to finger confrontation. Extraocular movements are intact. Trigeminal sensation is intact and the muscles of mastication are normal. The face is symmetric. The palate elevates in the midline. Hearing intact. Voice is normal. Shoulder shrug is normal. The tongue has normal motion without fasciculations.   Coordination:    Normal finger to nose and heel to shin. Normal rapid alternating movements.   Gait:    Heel-toe and tandem gait are normal.   Motor Observation:    No asymmetry, no atrophy, and no involuntary movements noted. Tone:    Normal muscle tone.    Posture:    Posture is normal. normal erect    Strength:    Strength is V/V in the upper and lower limbs.      Sensation: intact to LT     Reflex Exam:  DTR's:    Deep tendon reflexes in the upper and lower extremities are normal bilaterally.   Toes:    The toes are downgoing bilaterally.   Clonus:    Clonus is absent.       Assessment/Plan:  50 year old with chronic intractable migraines for 40 years has tried multiple classes of medications. She has seen multiple neurologists. Last went to [facility] in June.  Remember to drink plenty of fluid, eat healthy meals and do not skip any meals. Try to eat protein with a every meal and eat a healthy snack such as fruit or nuts in between meals. Try to keep a regular sleep-wake schedule and try to exercise daily, particularly in the form of walking, 20-30 minutes a day, if you can.   As far as your medications  are concerned, I would like to suggest: Continue Topamax.  Could also try Depakote. Recommend Botox for Migraines For acute management: DHE one spray in each nostril as needed. No more than 4 in one hour or daily. Do not take within 24 hours of a Triptan. Relpax take one at onset. May repeat once in 2 hours. No more than 2 in 24 hours or 2-3 days a week  As far as diagnostic testing: Labs today  Discussed the following:  To prevent or relieve headaches, try the following: Cool Compress. Lie down and place a cool compress on your head.  Avoid headache triggers. If certain foods or odors seem to have triggered your migraines in the past, avoid them. A headache diary might help you identify triggers.  Include physical activity in your daily routine. Try a daily walk or other moderate aerobic exercise.  Manage stress. Find healthy ways to cope with the stressors, such as delegating tasks on your to-do list.  Practice relaxation techniques. Try deep breathing, yoga, massage and visualization.  Eat regularly. Eating regularly scheduled meals and maintaining a healthy diet might help prevent headaches. Also, drink plenty of fluids.  Follow a regular sleep schedule. Sleep deprivation might contribute to headaches Consider biofeedback. With this mind-body technique, you learn to control certain bodily functions - such as muscle tension, heart rate and blood pressure - to prevent headaches or reduce headache pain.    Proceed to emergency room if you experience new or worsening symptoms or symptoms do not resolve, if you have new neurologic symptoms or if headache is severe, or for any concerning symptom.   Cc: Dr. Doy Hutching and Dr. Danella Sensing, [facility] [address] [place], [place] [address]  Phone [phone] Fax [phone]

## 2016-08-03 DIAGNOSIS — G43711 Chronic migraine without aura, intractable, with status migrainosus: Secondary | ICD-10-CM | POA: Insufficient documentation

## 2016-09-22 ENCOUNTER — Telehealth: Payer: Self-pay | Admitting: Neurology

## 2016-09-22 NOTE — Telephone Encounter (Signed)
Patient called to schedule BOTOX appointment

## 2016-09-25 ENCOUNTER — Encounter: Payer: Self-pay | Admitting: Neurology

## 2016-09-26 ENCOUNTER — Telehealth: Payer: Self-pay | Admitting: Neurology

## 2016-09-26 NOTE — Telephone Encounter (Signed)
Jennifer Sims, patient would like to proceed with botox. I can;t rememebr if you spoke with her before? thanks

## 2016-09-28 ENCOUNTER — Telehealth: Payer: Self-pay | Admitting: Neurology

## 2016-09-28 NOTE — Telephone Encounter (Signed)
[name], can you call patient please about Botox? She has emailed me twice thanks

## 2016-10-03 NOTE — Telephone Encounter (Signed)
Pt called in to schedule a botox appt. Please call [phone]

## 2016-10-03 NOTE — Telephone Encounter (Signed)
Called patient to schedule MRI, she did not answer, I left a VM asking her to return my call.

## 2016-10-03 NOTE — Telephone Encounter (Signed)
Called the patients alternate number and reached her, schedule injection.

## 2016-10-03 NOTE — Telephone Encounter (Signed)
Thank yoU !!!

## 2016-10-04 ENCOUNTER — Other Ambulatory Visit: Payer: Self-pay | Admitting: *Deleted

## 2016-10-04 MED ORDER — ONABOTULINUMTOXINA 100 UNITS IJ SOLR: INTRAMUSCULAR | 3 refills | Status: DC

## 2016-10-05 NOTE — Telephone Encounter (Signed)
Please refer to previous phone note.

## 2016-10-18 ENCOUNTER — Ambulatory Visit (INDEPENDENT_AMBULATORY_CARE_PROVIDER_SITE_OTHER): Payer: BC Managed Care – PPO | Admitting: Neurology

## 2016-10-18 VITALS — BP 130/91 | HR 69 | Ht 62.75 in

## 2016-10-18 DIAGNOSIS — G43709 Chronic migraine without aura, not intractable, without status migrainosus: Secondary | ICD-10-CM | POA: Diagnosis not present

## 2016-10-18 MED ORDER — PROCHLORPERAZINE MALEATE 10 MG PO TABS: 10.0000 mg | ORAL_TABLET | Freq: Four times a day (QID) | ORAL | 0 refills | Status: DC | PRN

## 2016-10-18 MED ORDER — METOCLOPRAMIDE HCL 10 MG PO TABS: 10.0000 mg | ORAL_TABLET | Freq: Once | ORAL | 5 refills | Status: DC

## 2016-10-18 NOTE — Progress Notes (Signed)
Consent Form Botulism Toxin Injection For Chronic Migraine Consented patient Botulism toxin has been approved by the Federal drug administration for treatment of chronic migraine. Botulism toxin does not cure chronic migraine and it may not be effective in some patients.  The administration of botulism toxin is accomplished by injecting a small amount of toxin into the muscles of the neck and head. Dosage must be titrated for each individual. Any benefits resulting from botulism toxin tend to wear off after 3 months with a repeat injection required if benefit is to be maintained. Injections are usually done every 3-4 months with maximum effect peak achieved by about 2 or 3 weeks. Botulism toxin is expensive and you should be sure of what costs you will incur resulting from the injection.  The side effects of botulism toxin use for chronic migraine may include:   -Transient, and usually mild, facial weakness with facial injections  -Transient, and usually mild, head or neck weakness with head/neck injections  -Reduction or loss of forehead facial animation due to forehead muscle weakness  -Eyelid drooping  -Dry eye  -Pain at the site of injection or bruising at the site of injection  -Double vision  -Potential unknown long term risks  Contraindications: You should not have Botox if you are pregnant, nursing, allergic to albumin, have an infection, skin condition, or muscle weakness at the site of the injection, or have myasthenia gravis, Lambert-Eaton syndrome, or ALS.  It is also possible that as with any injection, there may be an allergic reaction or no effect from the medication. Reduced effectiveness after repeated injections is sometimes seen and rarely infection at the injection site may occur. All care will be taken to prevent these side effects. If therapy is given over a long time, atrophy and wasting in the muscle injected may occur. Occasionally the patient's become refractory to  treatment because they develop antibodies to the toxin. In this event, therapy needs to be modified.  I have read the above information and consent to the administration of botulism toxin.       BOTOX PROCEDURE NOTE FOR MIGRAINE HEADACHE    Contraindications and precautions discussed with patient(above). Aseptic procedure was observed and patient tolerated procedure. Procedure performed by Dr. Artemio Aly  The condition has existed for more than 6 months, and pt does not have a diagnosis of ALS, Myasthenia Gravis or Lambert-Eaton Syndrome. Risks and benefits of injections discussed and pt agrees to proceed with the procedure. Written consent obtained  These injections are medically necessary. These injections do not cause sedations or hallucinations which the oral therapies may cause.  Indication/Diagnosis: chronic migraine BOTOX(J0585) injection was performed according to protocol by [employer]. 200 units of BOTOX was dissolved into 4 cc NS.  NDC: [address]-10  Botox-100unitsx2 vials Lot: [id number] Expiration: 04/2019 NDC:0023-1145-01 [id number]  0.9% Sodium Chloride- 49mL total [id number] Expiration: 04/2018 NDC: 00867-619-50   Description of procedure:  The patient was placed in a sitting position. The standard protocol was used for Botox as follows, with 5 units of Botox injected at each site:   -Procerus muscle, midline injection  -Corrugator muscle, bilateral injection  -Frontalis muscle, bilateral injection, with 2 sites each side, medial injection was performed in the upper one third of the frontalis muscle, in the region vertical from the medial inferior edge of the superior orbital rim. The lateral injection was again in the upper one third of the forehead vertically above the lateral limbus of the cornea, 1.5 cm lateral to the  medial injection site.  -Temporalis muscle injection, 4 sites, bilaterally. The first injection was 3 cm above the tragus of the ear,  second injection site was 1.5 cm to 3 cm up from the first injection site in line with the tragus of the ear. The third injection site was 1.5-3 cm forward between the first 2 injection sites. The fourth injection site was 1.5 cm posterior to the second injection site.  -Occipitalis muscle injection, 3 sites, bilaterally. The first injection was done one half way between the occipital protuberance and the tip of the mastoid process behind the ear. The second injection site was done lateral and superior to the first, 1 fingerbreadth from the first injection. The third injection site was 1 fingerbreadth superiorly and medially from the first injection site.  -Cervical paraspinal muscle injection, 2 sites, bilateral knee first injection site was 1 cm from the midline of the cervical spine, 3 cm inferior to the lower border of the occipital protuberance. The second injection site was 1.5 cm superiorly and laterally to the first injection site.  -Trapezius muscle injection was performed at 3 sites, bilaterally. The first injection site was in the upper trapezius muscle halfway between the inflection point of the neck, and the acromion. The second injection site was one half way between the acromion and the first injection site. The third injection was done between the first injection site and the inflection point of the neck.   Will return for repeat injection in 3 months.   A 200 unit sof Botox was used, 155 units were injected, the rest of the Botox was wasted. The patient tolerated the procedure well, there were no complications of the above procedure.

## 2016-10-18 NOTE — Progress Notes (Signed)
Botox-100unitsx2 vials Lot: [id number] Expiration: 04/2019 NDC:0023-1145-01 [id number]  0.9% Sodium Chloride- 4mL total [id number] Expiration: 04/2018 NDC: [id number]  *Buy and bill Dx: [phone]

## 2016-10-18 NOTE — Patient Instructions (Addendum)
Overall you are doing fairly well but I do want to suggest a few things today:   Remember to drink plenty of fluid, eat healthy meals and do not skip any meals. Try to eat protein with a every meal and eat a healthy snack such as fruit or nuts in between meals. Try to keep a regular sleep-wake schedule and try to exercise daily, particularly in the form of walking, 20-30 minutes a day, if you can.   As far as your medications are concerned, I would like to suggest: Take compazine or Reglan at onset of migraine. Try them separately.   I would like to see you back for next botox, sooner if we need to. Please call us with any interim questions, concerns, problems, updates or refill requests.   Our phone number is [phone]. We also have an after hours call service for urgent matters and there is a physician on-call for urgent questions. For any emergencies you know to call 911 or go to the nearest emergency room  Metoclopramide tablets What is this medicine? METOCLOPRAMIDE (met oh kloe PRA mide) is used to treat the symptoms of gastroesophageal reflux disease (GERD) like heartburn. It is also used to treat people with slow emptying of the stomach and intestinal tract. This medicine may be used for other purposes; ask your health care provider or pharmacist if you have questions. What should I tell my health care provider before I take this medicine? They need to know if you have any of these conditions: -breast cancer -depression -diabetes -heart failure -high blood pressure -kidney disease -liver disease -Parkinson's disease or a movement disorder -pheochromocytoma -seizures -stomach obstruction, bleeding, or perforation -an unusual or allergic reaction to metoclopramide, procainamide, sulfites, other medicines, foods, dyes, or preservatives -pregnant or trying to get pregnant -breast-feeding How should I use this medicine? Take this medicine by mouth with a glass of water. Follow the  directions on the prescription label. Take this medicine on an empty stomach, about 30 minutes before eating. Take your doses at regular intervals. Do not take your medicine more often than directed. Do not stop taking except on the advice of your doctor or health care professional. A special MedGuide will be given to you by the pharmacist with each prescription and refill. Be sure to read this information carefully each time. Talk to your pediatrician regarding the use of this medicine in children. Special care may be needed. Overdosage: If you think you have taken too much of this medicine contact a poison control center or emergency room at once. NOTE: This medicine is only for you. Do not share this medicine with others. What if I miss a dose? If you miss a dose, take it as soon as you can. If it is almost time for your next dose, take only that dose. Do not take double or extra doses. What may interact with this medicine? -acetaminophen -cyclosporine -digoxin -medicines for blood pressure -medicines for diabetes, including insulin -medicines for hay fever and other allergies -medicines for depression, especially an Monoamine Oxidase Inhibitor (MAOI) -medicines for Parkinson's disease, like levodopa -medicines for sleep or for pain -tetracycline This list may not describe all possible interactions. Give your health care provider a list of all the medicines, herbs, non-prescription drugs, or dietary supplements you use. Also tell them if you smoke, drink alcohol, or use illegal drugs. Some items may interact with your medicine. What should I watch for while using this medicine? It may take a few weeks for  your stomach condition to start to get better. However, do not take this medicine for longer than 12 weeks. The longer you take this medicine, and the more you take it, the greater your chances are of developing serious side effects. If you are an elderly patient, a female patient, or you  have diabetes, you may be at an increased risk for side effects from this medicine. Contact your doctor immediately if you start having movements you cannot control such as lip smacking, rapid movements of the tongue, involuntary or uncontrollable movements of the eyes, head, arms and legs, or muscle twitches and spasms. Patients and their families should watch out for worsening depression or thoughts of suicide. Also watch out for any sudden or severe changes in feelings such as feeling anxious, agitated, panicky, irritable, hostile, aggressive, impulsive, severely restless, overly excited and hyperactive, or not being able to sleep. If this happens, especially at the beginning of treatment or after a change in dose, call your doctor. Do not treat yourself for high fever. Ask your doctor or health care professional for advice. You may get drowsy or dizzy. Do not drive, use machinery, or do anything that needs mental alertness until you know how this drug affects you. Do not stand or sit up quickly, especially if you are an older patient. This reduces the risk of dizzy or fainting spells. Alcohol can make you more drowsy and dizzy. Avoid alcoholic drinks. What side effects may I notice from receiving this medicine? Side effects that you should report to your doctor or health care professional as soon as possible: -allergic reactions like skin rash, itching or hives, swelling of the face, lips, or tongue -abnormal production of milk in females -breast enlargement in both males and females -change in the way you walk -difficulty moving, speaking or swallowing -drooling, lip smacking, or rapid movements of the tongue -excessive sweating -fever -involuntary or uncontrollable movements of the eyes, head, arms and legs -irregular heartbeat or palpitations -muscle twitches and spasms -unusually weak or tired Side effects that usually do not require medical attention (report to your doctor or health care  professional if they continue or are bothersome): -change in sex drive or performance -depressed mood -diarrhea -difficulty sleeping -headache -menstrual changes -restless or nervous This list may not describe all possible side effects. Call your doctor for medical advice about side effects. You may report side effects to FDA at 1-800-FDA-1088. Where should I keep my medicine? Keep out of the reach of children. Store at room temperature between 20 and 25 degrees C (68 and 77 degrees F). Protect from light. Keep container tightly closed. Throw away any unused medicine after the expiration date. NOTE: This sheet is a summary. It may not cover all possible information. If you have questions about this medicine, talk to your doctor, pharmacist, or health care provider.    2016, [employer] Standard. (2012-04-10 13:04:38)   Prochlorperazine tablets What is this medicine? PROCHLORPERAZINE (proe klor PER a zeen) helps to control severe nausea and vomiting. This medicine is also used to treat schizophrenia. It can also help patients who experience anxiety that is not due to psychological illness. This medicine may be used for other purposes; ask your health care provider or pharmacist if you have questions. What should I tell my health care provider before I take this medicine? They need to know if you have any of these conditions: -blood disorders or disease -dementia -liver disease or jaundice -Parkinson's disease -uncontrollable movement disorder -an unusual or  allergic reaction to prochlorperazine, other medicines, foods, dyes, or preservatives -pregnant or trying to get pregnant -breast-feeding How should I use this medicine? Take this medicine by mouth with a glass of water. Follow the directions on the prescription label. Take your doses at regular intervals. Do not take your medicine more often than directed. Do not stop taking this medicine suddenly. This can cause nausea,  vomiting, and dizziness. Ask your doctor or health care professional for advice. Talk to your pediatrician regarding the use of this medicine in children. Special care may be needed. While this drug may be prescribed for children as young as 2 years for selected conditions, precautions do apply. Overdosage: If you think you have taken too much of this medicine contact a poison control center or emergency room at once. NOTE: This medicine is only for you. Do not share this medicine with others. What if I miss a dose? If you miss a dose, take it as soon as you can. If it is almost time for your next dose, take only that dose. Do not take double or extra doses. What may interact with this medicine? Do not take this medicine with any of the following medications: -amoxapine -antidepressants like citalopram, escitalopram, fluoxetine, paroxetine, and sertraline -deferoxamine -dofetilide -maprotiline -tricyclic antidepressants like amitriptyline, clomipramine, imipramine, nortiptyline and others This medicine may also interact with the following medications: -lithium -medicines for pain -phenytoin -propranolol -warfarin This list may not describe all possible interactions. Give your health care provider a list of all the medicines, herbs, non-prescription drugs, or dietary supplements you use. Also tell them if you smoke, drink alcohol, or use illegal drugs. Some items may interact with your medicine. What should I watch for while using this medicine? Visit your doctor or health care professional for regular checks on your progress. You may get drowsy or dizzy. Do not drive, use machinery, or do anything that needs mental alertness until you know how this medicine affects you. Do not stand or sit up quickly, especially if you are an older patient. This reduces the risk of dizzy or fainting spells. Alcohol may interfere with the effect of this medicine. Avoid alcoholic drinks. This medicine can  reduce the response of your body to heat or cold. Dress warm in cold weather and stay hydrated in hot weather. If possible, avoid extreme temperatures like saunas, hot tubs, very hot or cold showers, or activities that can cause dehydration such as vigorous exercise. This medicine can make you more sensitive to the sun. Keep out of the sun. If you cannot avoid being in the sun, wear protective clothing and use sunscreen. Do not use sun lamps or tanning beds/booths. Your mouth may get dry. Chewing sugarless gum or sucking hard candy, and drinking plenty of water may help. Contact your doctor if the problem does not go away or is severe. What side effects may I notice from receiving this medicine? Side effects that you should report to your doctor or health care professional as soon as possible: -blurred vision -breast enlargement in men or women -breast milk in women who are not breast-feeding -chest pain, fast or irregular heartbeat -confusion, restlessness -dark yellow or brown urine -difficulty breathing or swallowing -dizziness or fainting spells -drooling, shaking, movement difficulty (shuffling walk) or rigidity -fever, chills, sore throat -involuntary or uncontrollable movements of the eyes, mouth, head, arms, and legs -seizures -stomach area pain -unusually weak or tired -unusual bleeding or bruising -yellowing of skin or eyes Side effects that usually do  not require medical attention (report to your doctor or health care professional if they continue or are bothersome): -difficulty passing urine -difficulty sleeping -headache -sexual dysfunction -skin rash, or itching This list may not describe all possible side effects. Call your doctor for medical advice about side effects. You may report side effects to FDA at 1-800-FDA-1088. Where should I keep my medicine? Keep out of the reach of children. Store at room temperature between 15 and 30 degrees C (59 and 86 degrees F). Protect  from light. Throw away any unused medicine after the expiration date. NOTE: This sheet is a summary. It may not cover all possible information. If you have questions about this medicine, talk to your doctor, pharmacist, or health care provider.    2016, [employer] Standard. (2012-05-01 16:59:39)

## 2017-01-10 ENCOUNTER — Encounter: Payer: Self-pay | Admitting: Neurology

## 2017-01-10 ENCOUNTER — Ambulatory Visit (INDEPENDENT_AMBULATORY_CARE_PROVIDER_SITE_OTHER): Payer: BC Managed Care – PPO | Admitting: Neurology

## 2017-01-10 VITALS — BP 146/102 | HR 73

## 2017-01-10 DIAGNOSIS — G43709 Chronic migraine without aura, not intractable, without status migrainosus: Secondary | ICD-10-CM | POA: Diagnosis not present

## 2017-01-10 MED ORDER — INDOMETHACIN 50 MG PO CAPS: ORAL_CAPSULE | ORAL | 12 refills | Status: DC

## 2017-01-10 NOTE — Progress Notes (Signed)
Consent Form Botulism Toxin Injection For Chronic Migraine  Botulism toxin has been approved by the Federal drug administration for treatment of chronic migraine. Botulism toxin does not cure chronic migraine and it may not be effective in some patients.  The administration of botulism toxin is accomplished by injecting a small amount of toxin into the muscles of the neck and head. Dosage must be titrated for each individual. Any benefits resulting from botulism toxin tend to wear off after 3 months with a repeat injection required if benefit is to be maintained. Injections are usually done every 3-4 months with maximum effect peak achieved by about 2 or 3 weeks. Botulism toxin is expensive and you should be sure of what costs you will incur resulting from the injection.  The side effects of botulism toxin use for chronic migraine may include:   -Transient, and usually mild, facial weakness with facial injections  -Transient, and usually mild, head or neck weakness with head/neck injections  -Reduction or loss of forehead facial animation due to forehead muscle              weakness  -Eyelid drooping  -Dry eye  -Pain at the site of injection or bruising at the site of injection  -Double vision  -Potential unknown long term risks  Contraindications: You should not have Botox if you are pregnant, nursing, allergic to albumin, have an infection, skin condition, or muscle weakness at the site of the injection, or have myasthenia gravis, Lambert-Eaton syndrome, or ALS.  It is also possible that as with any injection, there may be an allergic reaction or no effect from the medication. Reduced effectiveness after repeated injections is sometimes seen and rarely infection at the injection site may occur. All care will be taken to prevent these side effects. If therapy is given over a long time, atrophy and wasting in the muscle injected may occur. Occasionally the patient's become refractory to  treatment because they develop antibodies to the toxin. In this event, therapy needs to be modified.  I have read the above information and consent to the administration of botulism toxin.    ______________  _____   _________________  Patient signature     Date   Witness signature       BOTOX PROCEDURE NOTE FOR MIGRAINE HEADACHE    Contraindications and precautions discussed with patient(above). Aseptic procedure was observed and patient tolerated procedure. Procedure performed by Dr. Georgia Dom  The condition has existed for more than 6 months, and pt does not have a diagnosis of ALS, Myasthenia Gravis or Lambert-Eaton Syndrome. Risks and benefits of injections discussed and pt agrees to proceed with the procedure. Written consent obtained  These injections are medically necessary. He receives good benefits from these injections. These injections do not cause sedations or hallucinations which the oral therapies may cause.  Indication/Diagnosis: chronic migraine BOTOX(J0585) injection was performed according to protocol by [employer]. 200 units of BOTOX was dissolved into 4 cc NS.  NDC: [address]-16  Type of toxin: Botox  Botox-100unitsx2 vials Lot: [id number] Expiration: 05/2019 NDC: 0109-3235-57 [id number]  0.9% Sodium Chloride bacteriostatic- 65m total Lot: [id number] Expiration: 05/26/2018 NDC: [address]-15 Dx: [id number] pharmacy     Description of procedure:  The patient was placed in a sitting position. The standard protocol was used for Botox as follows, with 5 units of Botox injected at each site:   -Procerus muscle, midline injection  -Corrugator muscle, bilateral injection  -Frontalis muscle, bilateral injection, with 2 sites  each side, medial injection was performed in the upper one third of the frontalis muscle, in the region vertical from the medial inferior edge of the superior orbital rim. The lateral injection was again in the upper one  third of the forehead vertically above the lateral limbus of the cornea, 1.5 cm lateral to the medial injection site.  -Temporalis muscle injection, 4 sites, bilaterally. The first injection was 3 cm above the tragus of the ear, second injection site was 1.5 cm to 3 cm up from the first injection site in line with the tragus of the ear. The third injection site was 1.5-3 cm forward between the first 2 injection sites. The fourth injection site was 1.5 cm posterior to the second injection site.  -Occipitalis muscle injection, 3 sites, bilaterally. The first injection was done one half way between the occipital protuberance and the tip of the mastoid process behind the ear. The second injection site was done lateral and superior to the first, 1 fingerbreadth from the first injection. The third injection site was 1 fingerbreadth superiorly and medially from the first injection site.  -Cervical paraspinal muscle injection, 2 sites, bilateral knee first injection site was 1 cm from the midline of the cervical spine, 3 cm inferior to the lower border of the occipital protuberance. The second injection site was 1.5 cm superiorly and laterally to the first injection site.  -Trapezius muscle injection was performed at 3 sites, bilaterally. The first injection site was in the upper trapezius muscle halfway between the inflection point of the neck, and the acromion. The second injection site was one half way between the acromion and the first injection site. The third injection was done between the first injection site and the inflection point of the neck.  30 Units extra in the frontalis muscles (see documented digital image for placement)   Will return for repeat injection in 3 months.   A 200 unit sof Botox was used, 185 units were injected, the rest of the Botox was wasted. The patient tolerated the procedure well, there were no complications of the above procedure.

## 2017-01-10 NOTE — Progress Notes (Signed)
Botox-100unitsx2 vials Lot: [id number] Expiration: 05/2019 NDC: 7169-6789-38 [id number]  0.9% Sodium Chloride bacteriostatic- 3mL total Lot: [id number] Expiration: 05/26/2018 NDC: 5277-8242-35  Dx: [id number] [facility] pharmacy

## 2017-01-19 ENCOUNTER — Ambulatory Visit: Payer: BC Managed Care – PPO | Admitting: Neurology

## 2017-03-10 ENCOUNTER — Encounter: Payer: Self-pay | Admitting: Neurology

## 2017-04-11 ENCOUNTER — Telehealth: Payer: Self-pay | Admitting: Neurology

## 2017-04-11 NOTE — Telephone Encounter (Signed)
I called and spoke with patient about canceling her injection due to weather. I told her I would call her back once we are in office.

## 2017-04-12 ENCOUNTER — Ambulatory Visit: Payer: BC Managed Care – PPO | Admitting: Neurology

## 2017-04-12 NOTE — Telephone Encounter (Signed)
I spoke with the patient and scheduled her injection. Medication has arrived.

## 2017-04-13 ENCOUNTER — Ambulatory Visit (INDEPENDENT_AMBULATORY_CARE_PROVIDER_SITE_OTHER): Payer: BC Managed Care – PPO | Admitting: Neurology

## 2017-04-13 VITALS — BP 135/93 | HR 77 | Ht 62.75 in | Wt 128.8 lb

## 2017-04-13 DIAGNOSIS — G43709 Chronic migraine without aura, not intractable, without status migrainosus: Secondary | ICD-10-CM | POA: Diagnosis not present

## 2017-04-13 MED ORDER — DICLOFENAC POTASSIUM(MIGRAINE) 50 MG PO PACK: 50.0000 mg | PACK | Freq: Once | ORAL | 6 refills | Status: DC | PRN

## 2017-04-13 MED ORDER — PROPRANOLOL HCL 10 MG PO TABS
10.0000 mg | ORAL_TABLET | Freq: Three times a day (TID) | ORAL | 6 refills | Status: DC | PRN
Start: 2017-04-13 — End: 2017-12-11

## 2017-04-13 MED ORDER — DICLOFENAC POTASSIUM 25 MG PO CAPS: ORAL_CAPSULE | ORAL | 8 refills | Status: DC

## 2017-04-13 NOTE — Progress Notes (Signed)
She has one migraine a month, lasts a day. Previous to Botox 15 headaches days a month more than 10 migraines a month.   Consent Form Botulism Toxin Injection For Chronic Migraine  Botulism toxin has been approved by the Federal drug administration for treatment of chronic migraine. Botulism toxin does not cure chronic migraine and it may not be effective in some patients.  The administration of botulism toxin is accomplished by injecting a small amount of toxin into the muscles of the neck and head. Dosage must be titrated for each individual. Any benefits resulting from botulism toxin tend to wear off after 3 months with a repeat injection required if benefit is to be maintained. Injections are usually done every 3-4 months with maximum effect peak achieved by about 2 or 3 weeks. Botulism toxin is expensive and you should be sure of what costs you will incur resulting from the injection.  The side effects of botulism toxin use for chronic migraine may include:   -Transient, and usually mild, facial weakness with facial injections  -Transient, and usually mild, head or neck weakness with head/neck injections  -Reduction or loss of forehead facial animation due to forehead muscle              weakness  -Eyelid drooping  -Dry eye  -Pain at the site of injection or bruising at the site of injection  -Double vision  -Potential unknown long term risks  Contraindications: You should not have Botox if you are pregnant, nursing, allergic to albumin, have an infection, skin condition, or muscle weakness at the site of the injection, or have myasthenia gravis, Lambert-Eaton syndrome, or ALS.  It is also possible that as with any injection, there may be an allergic reaction or no effect from the medication. Reduced effectiveness after repeated injections is sometimes seen and rarely infection at the injection site may occur. All care will be taken to prevent these side effects. If therapy is given  over a long time, atrophy and wasting in the muscle injected may occur. Occasionally the patient's become refractory to treatment because they develop antibodies to the toxin. In this event, therapy needs to be modified.  I have read the above information and consent to the administration of botulism toxin.    ______________  _____   _________________  Patient signature     Date   Witness signature       BOTOX PROCEDURE NOTE FOR MIGRAINE HEADACHE    Contraindications and precautions discussed with patient(above). Aseptic procedure was observed and patient tolerated procedure. Procedure performed by Dr. Artemio Aly  The condition has existed for more than 6 months, and pt does not have a diagnosis of ALS, Myasthenia Gravis or Lambert-Eaton Syndrome. Risks and benefits of injections discussed and pt agrees to proceed with the procedure. Written consent obtained  These injections are medically necessary. He receives good benefits from these injections. These injections do not cause sedations or hallucinations which the oral therapies may cause.  Indication/Diagnosis: chronic migraine BOTOX(J0585) injection was performed according to protocol by [employer]. 200 units of BOTOX was dissolved into 4 cc NS.  NDC: [phone]  Type of toxin: Botox Botox 100 units/vial x 2 vials from [facility] NDC [phone] Lot [id number] Exp 11 2020  Diluted in 4 ml of Bacteriostatic 0.9% NaCl NDC 7408-1448-18 Lot [id number] Exp [id number]  Topical pain reliever applied to injection areas w/ gauze Lidocaine 5% cream NDC 56314-970-26 Lot [id number] Exp 06/19  Description of procedure:  The patient was placed in a sitting position. The standard protocol was used for Botox as follows, with 5 units of Botox injected at each site:   -Procerus muscle, midline injection  -Corrugator muscle, bilateral injection  -Frontalis muscle, bilateral injection, with 2 sites each side, medial  injection was performed in the upper one third of the frontalis muscle, in the region vertical from the medial inferior edge of the superior orbital rim. The lateral injection was again in the upper one third of the forehead vertically above the lateral limbus of the cornea, 1.5 cm lateral to the medial injection site.  -Temporalis muscle injection, 4 sites, bilaterally. The first injection was 3 cm above the tragus of the ear, second injection site was 1.5 cm to 3 cm up from the first injection site in line with the tragus of the ear. The third injection site was 1.5-3 cm forward between the first 2 injection sites. The fourth injection site was 1.5 cm posterior to the second injection site.  -Occipitalis muscle injection, 3 sites, bilaterally. The first injection was done one half way between the occipital protuberance and the tip of the mastoid process behind the ear. The second injection site was done lateral and superior to the first, 1 fingerbreadth from the first injection. The third injection site was 1 fingerbreadth superiorly and medially from the first injection site.  -Cervical paraspinal muscle injection, 2 sites, bilateral knee first injection site was 1 cm from the midline of the cervical spine, 3 cm inferior to the lower border of the occipital protuberance. The second injection site was 1.5 cm superiorly and laterally to the first injection site.  -Trapezius muscle injection was performed at 3 sites, bilaterally. The first injection site was in the upper trapezius muscle halfway between the inflection point of the neck, and the acromion. The second injection site was one half way between the acromion and the first injection site. The third injection was done between the first injection site and the inflection point of the neck.   Will return for repeat injection in 3 months.   A 200 unit sof Botox was used, 155 units were injected, the rest of the Botox was wasted. The patient  tolerated the procedure well, there were no complications of the above procedure.

## 2017-04-13 NOTE — Progress Notes (Signed)
Botox 100 units/vial x 2 vials from [facility] [phone] Lot [id number] Exp 11 2020  Diluted in 4 ml of Bacteriostatic 0.9% NaCl NDC 3382-5053-97 Lot [id number] Exp [id number]  Topical pain reliever applied to injection areas w/ gauze Lidocaine 5% cream NDC 67341-937-90 Lot [id number] Exp 06/19

## 2017-05-02 ENCOUNTER — Telehealth: Payer: Self-pay | Admitting: Neurology

## 2017-05-02 NOTE — Telephone Encounter (Signed)
Pt called wanting to schedule next botox injection, please call

## 2017-05-05 ENCOUNTER — Telehealth: Payer: Self-pay | Admitting: Neurology

## 2017-05-05 NOTE — Telephone Encounter (Signed)
Pt calling to schedule her next Botox, please call mobile#

## 2017-05-09 NOTE — Telephone Encounter (Signed)
Please refer to other phone note.

## 2017-05-09 NOTE — Telephone Encounter (Signed)
Called and scheduled the patient.

## 2017-06-05 ENCOUNTER — Encounter: Payer: Self-pay | Admitting: Neurology

## 2017-06-05 MED ORDER — DICLOFENAC POTASSIUM(MIGRAINE) 50 MG PO PACK: PACK | ORAL | 3 refills | Status: DC

## 2017-07-05 ENCOUNTER — Ambulatory Visit: Payer: BC Managed Care – PPO | Admitting: Neurology

## 2017-07-05 ENCOUNTER — Telehealth: Payer: Self-pay

## 2017-07-05 NOTE — Telephone Encounter (Signed)
Spoke to pt earlier today. Said that she had called previously to scheduled today's appt in order to wean off of topiramate. However, pt has since seen her internist who has switched her from topiramate to amitriptyline. She is doing well w/ this. Appt cancelled per Dr. Lucia Gaskins. Pt will keep upcoming appt for Botox.

## 2017-07-25 ENCOUNTER — Ambulatory Visit (INDEPENDENT_AMBULATORY_CARE_PROVIDER_SITE_OTHER): Payer: BC Managed Care – PPO | Admitting: Neurology

## 2017-07-25 VITALS — BP 129/98 | HR 101 | Ht 62.75 in | Wt 136.4 lb

## 2017-07-25 DIAGNOSIS — G43709 Chronic migraine without aura, not intractable, without status migrainosus: Secondary | ICD-10-CM

## 2017-07-25 DIAGNOSIS — G43711 Chronic migraine without aura, intractable, with status migrainosus: Secondary | ICD-10-CM

## 2017-07-25 MED ORDER — NORTRIPTYLINE HCL 10 MG PO CAPS: 10.0000 mg | ORAL_CAPSULE | Freq: Every day | ORAL | 6 refills | Status: DC

## 2017-07-25 NOTE — Progress Notes (Signed)
Botox 100 units/vial x 2 vials from [facility] NDC 0992-7800-44 Lot [id number] Exp 03 2021  Diluted in 4 ml of Bacteriostatic 0.9% NaCl NDC 8685-4883-01 Lot [id number] Exp [id number]

## 2017-07-26 NOTE — Progress Notes (Signed)

## 2017-08-07 ENCOUNTER — Telehealth: Payer: Self-pay | Admitting: Neurology

## 2017-08-07 NOTE — Telephone Encounter (Signed)
Pt would like a call to be scheduled for her next Botox, please call

## 2017-08-08 NOTE — Telephone Encounter (Signed)
I called and scheduled the patient for her next injection.

## 2017-08-15 ENCOUNTER — Encounter: Payer: Self-pay | Admitting: Neurology

## 2017-09-28 ENCOUNTER — Telehealth: Payer: Self-pay

## 2017-09-28 MED ORDER — ERENUMAB-AOOE 70 MG/ML ~~LOC~~ SOAJ: 70.0000 mg | SUBCUTANEOUS | 0 refills | Status: DC

## 2017-09-28 NOTE — Telephone Encounter (Signed)
I called pt. I advised her that per our aimovig rep, the RX for aimovig should now be sent to the local pharmacy. [facility]. Pt reports that she has 4 more injections left from the samples that Dr. Lucia Gaskins gave her. Her last injection was 9/30/ 2018, not due for another until 10/24/2017. Pt is agreeable to sending the RX for aimovig to [facility] local [facility].

## 2017-09-29 ENCOUNTER — Encounter: Payer: Self-pay | Admitting: Neurology

## 2017-10-02 NOTE — Telephone Encounter (Signed)
[facility] to [facility]. Received a receipt of confirmation.

## 2017-10-04 ENCOUNTER — Telehealth: Payer: Self-pay | Admitting: *Deleted

## 2017-10-04 NOTE — Telephone Encounter (Signed)
PA started for Aimovig 70 mg injection. Sent to [facility]. Should have a determination within 1 business day.

## 2017-10-11 NOTE — Telephone Encounter (Signed)
PA for aimovig was denied because pt has used botox within the past 4 months. [facility]. BE#[id number] [name],  Will you call this pt and explain that her pa for aimovig was denied and that if she is signed up for the access card/bridge program, aimovig is supposed to provide 6 free months? If pt wishes to continue aimovig, she will need an appt with Dr. Lucia Gaskins in the next 5 months and sign up for the access card, if not already done. She is scheduled for 10/31/17 for botox with Dr. Lucia Gaskins.

## 2017-10-13 NOTE — Telephone Encounter (Signed)
I tried calling patient to make her aware that the Aimovig was denied by her insurance and to see if she was able to sign up for the access card/bridge program? If not, would she be interested in switching to a comparable injection called Ajovy? Patient did not answer and her voicemail box was full.

## 2017-10-19 ENCOUNTER — Other Ambulatory Visit: Payer: Self-pay | Admitting: Neurology

## 2017-10-31 ENCOUNTER — Ambulatory Visit (INDEPENDENT_AMBULATORY_CARE_PROVIDER_SITE_OTHER): Payer: BC Managed Care – PPO | Admitting: Neurology

## 2017-10-31 ENCOUNTER — Encounter: Payer: Self-pay | Admitting: Neurology

## 2017-10-31 VITALS — BP 125/87 | HR 85

## 2017-10-31 DIAGNOSIS — G43709 Chronic migraine without aura, not intractable, without status migrainosus: Secondary | ICD-10-CM

## 2017-10-31 MED ORDER — FREMANEZUMAB-VFRM 225 MG/1.5ML ~~LOC~~ SOSY: 225.0000 mg | PREFILLED_SYRINGE | SUBCUTANEOUS | 0 refills | Status: DC

## 2017-10-31 NOTE — Progress Notes (Signed)
   BOTOX PROCEDURE NOTE FOR MIGRAINE HEADACHE  Started Ajovy today, WIll request numbing cream for patient,   Contraindications and precautions discussed with patient(above). Aseptic procedure was observed and patient tolerated procedure. Procedure performed by Dr. Artemio Aly  The condition has existed for more than 6 months, and pt does not have a diagnosis of ALS, Myasthenia Gravis or Lambert-Eaton Syndrome. Risks and benefits of injections discussed and pt agrees to proceed with the procedure. Written consent obtained  These injections are medically necessary. He receives good benefits from these injections. These injections do not cause sedations or hallucinations which the oral therapies may cause.  Indication/Diagnosis: chronic migraine BOTOX(J0585) injection was performed according to protocol by [facility]. 200 units of BOTOX was dissolved into 4 cc NS.  NDC: [address]-93  Description of procedure:  The patient was placed in a sitting position. The standard protocol was used for Botox as follows, with 5 units of Botox injected at each site:   -Procerus muscle, midline injection  -Corrugator muscle, bilateral injection  -Frontalis muscle, bilateral injection, with 2 sites each side, medial injection was performed in the upper one third of the frontalis muscle, in the region vertical from the medial inferior edge of the superior orbital rim. The lateral injection was again in the upper one third of the forehead vertically above the lateral limbus of the cornea, 1.5 cm lateral to the medial injection site.  -Temporalis muscle injection, 4 sites, bilaterally. The first injection was 3 cm above the tragus of the ear, second injection site was 1.5 cm to 3 cm up from the first injection site in line with the tragus of the ear. The third injection site was 1.5-3 cm forward between the first 2 injection sites. The fourth injection site was 1.5 cm posterior to the second injection  site.  -Occipitalis muscle injection, 3 sites, bilaterally. The first injection was done one half way between the occipital protuberance and the tip of the mastoid process behind the ear. The second injection site was done lateral and superior to the first, 1 fingerbreadth from the first injection. The third injection site was 1 fingerbreadth superiorly and medially from the first injection site.  -Cervical paraspinal muscle injection, 2 sites, bilateral knee first injection site was 1 cm from the midline of the cervical spine, 3 cm inferior to the lower border of the occipital protuberance. The second injection site was 1.5 cm superiorly and laterally to the first injection site.  -Trapezius muscle injection was performed at 3 sites, bilaterally. The first injection site was in the upper trapezius muscle halfway between the inflection point of the neck, and the acromion. The second injection site was one half way between the acromion and the first injection site. The third injection was done between the first injection site and the inflection point of the neck.   Will return for repeat injection in 3 months.   A 200 unit sof Botox was used, 155 units were injected, the rest of the Botox was wasted. The patient tolerated the procedure well, there were no complications of the above procedure.

## 2017-10-31 NOTE — Progress Notes (Signed)
Botox 200 units/ 2 vials Vial #1: LOT: [id number] EXP: 04/2020 NDC: 7902-4097-35 Vial #2: LOT: [id number] EXP: 04/2020 NDC: 6834-1962-22 SP  Bacteriostatic 0.9% Sodium Chloride 4 mLs LOT: [id number] EXP: 04/26/2019 NDC: 1194-1740-81 //BCrn  Sample of Ajovy 225 mg/1.5 mL injection given to patient.

## 2017-11-02 ENCOUNTER — Other Ambulatory Visit: Payer: Self-pay | Admitting: Neurology

## 2017-11-02 ENCOUNTER — Telehealth: Payer: Self-pay | Admitting: Neurology

## 2017-11-02 ENCOUNTER — Encounter: Payer: Self-pay | Admitting: Neurology

## 2017-11-02 MED ORDER — TOPIRAMATE 25 MG PO TABS: 25.0000 mg | ORAL_TABLET | Freq: Every evening | ORAL | 11 refills | Status: DC | PRN

## 2017-11-02 MED ORDER — NONFORMULARY OR COMPOUNDED ITEM
1.0000 | 11 refills | Status: DC
Start: 2017-11-02 — End: 2018-02-06

## 2017-11-02 NOTE — Telephone Encounter (Signed)
Jennifer Sims, will you call in the numbing cream for patient please? See me today and we can do it together thanks

## 2017-11-02 NOTE — Telephone Encounter (Signed)
Called patient, she requests for [facility] [place]. She states [facility] has already called her about the numbing cream prescription. Dr. Lucia GaskinsAhern to write prescription for Topamax 25 mg. [facility] [place]. Received a receipt of confirmation.

## 2017-11-02 NOTE — Telephone Encounter (Addendum)
Called in prescription to [facility] for numbing cream for the patient. Ordered Benzocaine 20%/Lidocaine 6%/Tetracain 60 gm with no refills. They will contact the patient and have it mailed to her home. Dr. Jaynee Eagles updated.

## 2017-12-11 ENCOUNTER — Other Ambulatory Visit: Payer: Self-pay | Admitting: Neurology

## 2017-12-11 ENCOUNTER — Encounter: Payer: Self-pay | Admitting: Neurology

## 2017-12-11 MED ORDER — FREMANEZUMAB-VFRM 225 MG/1.5ML ~~LOC~~ SOSY: 225.0000 mg | PREFILLED_SYRINGE | SUBCUTANEOUS | 11 refills | Status: DC

## 2017-12-11 MED ORDER — METOCLOPRAMIDE HCL 10 MG PO TABS: ORAL_TABLET | ORAL | 5 refills | Status: DC

## 2018-01-16 ENCOUNTER — Encounter: Payer: Self-pay | Admitting: Neurology

## 2018-02-06 ENCOUNTER — Ambulatory Visit (INDEPENDENT_AMBULATORY_CARE_PROVIDER_SITE_OTHER): Payer: BC Managed Care – PPO | Admitting: Neurology

## 2018-02-06 ENCOUNTER — Telehealth: Payer: Self-pay | Admitting: Neurology

## 2018-02-06 ENCOUNTER — Encounter: Payer: Self-pay | Admitting: Neurology

## 2018-02-06 VITALS — BP 134/98 | HR 89

## 2018-02-06 DIAGNOSIS — M7918 Myalgia, other site: Secondary | ICD-10-CM | POA: Diagnosis not present

## 2018-02-06 DIAGNOSIS — R51 Headache: Secondary | ICD-10-CM

## 2018-02-06 DIAGNOSIS — G4733 Obstructive sleep apnea (adult) (pediatric): Secondary | ICD-10-CM | POA: Diagnosis not present

## 2018-02-06 DIAGNOSIS — G43709 Chronic migraine without aura, not intractable, without status migrainosus: Secondary | ICD-10-CM

## 2018-02-06 DIAGNOSIS — R519 Headache, unspecified: Secondary | ICD-10-CM

## 2018-02-06 DIAGNOSIS — M1990 Unspecified osteoarthritis, unspecified site: Secondary | ICD-10-CM

## 2018-02-06 MED ORDER — DICLOFENAC POTASSIUM(MIGRAINE) 50 MG PO PACK: PACK | ORAL | 0 refills | Status: DC

## 2018-02-06 MED ORDER — DICLOFENAC POTASSIUM(MIGRAINE) 50 MG PO PACK: 50.0000 mg | PACK | Freq: Once | ORAL | 11 refills | Status: DC | PRN

## 2018-02-06 MED ORDER — INDOMETHACIN 25 MG PO CAPS: ORAL_CAPSULE | ORAL | 6 refills | Status: DC

## 2018-02-06 NOTE — Telephone Encounter (Signed)
Please schedule pt for a 12 week BOTOX appt. Thank you.

## 2018-02-06 NOTE — Progress Notes (Signed)
Botox- 100 units x 2 vials Lot: [id number] Expiration: 07/2020 NDC: 5053-9767-34  Bacteriostatic 0.9% Sodium Chloride- 70mL total Lot: [id number] Expiration: 04/26/2019 NDC: 2409-7353-29  Dx: J24.268 B/B //BCrn

## 2018-02-06 NOTE — Progress Notes (Signed)
Interval history 02/06/2018: She snore heavily, very tired during the day, morning headaches intractable. Will send to sleep team for evaluation of obstructive sleep apnea.  She feels significantly improved on the botox. She only takes resue meds once a month, down to one migraine day a month on average. We just started Ajovy at last appointment, has had 2 injections so far. Propranolol as needed. Reglan not so great. She is on Topiramate 25mg  at bedtime. She wakes up every morning with headache and nausea. 3-4 mornings a week she wakes with the headache, dry mouth, loud snoring per husband, resolves later in the day. She would like a sleep evaluation. She also has lots of neck muscle tightness and pain. Discussed PT and dry needling, posture. Can try Indomethacin for neck pain, ibuprofen in the past has caused headaches will try something different.   - For OSA: refer to sleep eval - For myofascial neck pain: Physical Therapy: Cervical myofascial pain, forward posture contributing to migraines and cervicalgia. Please evaluate and treat including dry needling, stretching, strengthening, manual therapy/massage, heating, TENS unit, exercising for scapular stabilization, pectoral stretching and rhomboid strengthening as clinically warranted as well as any other modality as recommended by evaluation. - For arthritis: Indomethacin, prescribed today  Orders Placed This Encounter  Procedures  . Ambulatory referral to Sleep Studies  . Ambulatory referral to Physical Therapy   A total of 25 minutes was spent in with this patient face to face. Over half this time was spent on counseling patient on the OSA, neck pain, arthritis diagnosis and different therapeutic options available. This does not include time spent performing botox injections or anything botox/migraine related.    Consent Form Botulism Toxin Injection For Chronic Migraine  Botulism toxin has been approved by the Federal drug administration  for treatment of chronic migraine. Botulism toxin does not cure chronic migraine and it may not be effective in some patients.  The administration of botulism toxin is accomplished by injecting a small amount of toxin into the muscles of the neck and head. Dosage must be titrated for each individual. Any benefits resulting from botulism toxin tend to wear off after 3 months with a repeat injection required if benefit is to be maintained. Injections are usually done every 3-4 months with maximum effect peak achieved by about 2 or 3 weeks. Botulism toxin is expensive and you should be sure of what costs you will incur resulting from the injection.  The side effects of botulism toxin use for chronic migraine may include:   -Transient, and usually mild, facial weakness with facial injections  -Transient, and usually mild, head or neck weakness with head/neck injections  -Reduction or loss of forehead facial animation due to forehead muscle              weakness  -Eyelid drooping  -Dry eye  -Pain at the site of injection or bruising at the site of injection  -Double vision  -Potential unknown long term risks  Contraindications: You should not have Botox if you are pregnant, nursing, allergic to albumin, have an infection, skin condition, or muscle weakness at the site of the injection, or have myasthenia gravis, Lambert-Eaton syndrome, or ALS.  It is also possible that as with any injection, there may be an allergic reaction or no effect from the medication. Reduced effectiveness after repeated injections is sometimes seen and rarely infection at the injection site may occur. All care will be taken to prevent these side effects. If therapy is  given over a long time, atrophy and wasting in the muscle injected may occur. Occasionally the patient's become refractory to treatment because they develop antibodies to the toxin. In this event, therapy needs to be modified.  I have read the above information  and consent to the administration of botulism toxin.    ______________  _____   _________________  Patient signature     Date   Witness signature       BOTOX PROCEDURE NOTE FOR MIGRAINE HEADACHE    Contraindications and precautions discussed with patient(above). Aseptic procedure was observed and patient tolerated procedure. Procedure performed by Dr. Artemio Aly  The condition has existed for more than 6 months, and pt does not have a diagnosis of ALS, Myasthenia Gravis or Lambert-Eaton Syndrome. Risks and benefits of injections discussed and pt agrees to proceed with the procedure. Written consent obtained  These injections are medically necessary. He receives good benefits from these injections. These injections do not cause sedations or hallucinations which the oral therapies may cause.  Indication/Diagnosis: chronic migraine BOTOX(J0585) injection was performed according to protocol by [employer]. 200 units of BOTOX was dissolved into 4 cc NS.  NDC: [address]-40  Type of toxin: Botox   Description of procedure:  The patient was placed in a sitting position. The standard protocol was used for Botox as follows, with 5 units of Botox injected at each site:   -Procerus muscle, midline injection  -Corrugator muscle, bilateral injection  -Frontalis muscle, bilateral injection, with 2 sites each side, medial injection was performed in the upper one third of the frontalis muscle, in the region vertical from the medial inferior edge of the superior orbital rim. The lateral injection was again in the upper one third of the forehead vertically above the lateral limbus of the cornea, 1.5 cm lateral to the medial injection site.  -Temporalis muscle injection, 4 sites, bilaterally. The first injection was 3 cm above the tragus of the ear, second injection site was 1.5 cm to 3 cm up from the first injection site in line with the tragus of the ear. The third injection site was 1.5-3 cm  forward between the first 2 injection sites. The fourth injection site was 1.5 cm posterior to the second injection site.  -Occipitalis muscle injection, 3 sites, bilaterally. The first injection was done one half way between the occipital protuberance and the tip of the mastoid process behind the ear. The second injection site was done lateral and superior to the first, 1 fingerbreadth from the first injection. The third injection site was 1 fingerbreadth superiorly and medially from the first injection site.  -Cervical paraspinal muscle injection, 2 sites, bilateral knee first injection site was 1 cm from the midline of the cervical spine, 3 cm inferior to the lower border of the occipital protuberance. The second injection site was 1.5 cm superiorly and laterally to the first injection site.  -Trapezius muscle injection was performed at 3 sites, bilaterally. The first injection site was in the upper trapezius muscle halfway between the inflection point of the neck, and the acromion. The second injection site was one half way between the acromion and the first injection site. The third injection was done between the first injection site and the inflection point of the neck.   Will return for repeat injection in 3 months.   A 200 unit sof Botox was used, 155 units were injected, the rest of the Botox was wasted. The patient tolerated the procedure well, there were no complications  of the above procedure.

## 2018-02-07 NOTE — Telephone Encounter (Signed)
I called to scheduled the patient but she did not answer so I left a VM asking her to call me back.

## 2018-02-12 ENCOUNTER — Encounter: Payer: Self-pay | Admitting: Neurology

## 2018-02-15 MED ORDER — TOPIRAMATE 50 MG PO TABS: 50.0000 mg | ORAL_TABLET | Freq: Every day | ORAL | 11 refills | Status: DC

## 2018-02-26 ENCOUNTER — Ambulatory Visit: Payer: BC Managed Care – PPO | Attending: Neurology | Admitting: Physical Therapy

## 2018-02-26 ENCOUNTER — Encounter: Payer: Self-pay | Admitting: Physical Therapy

## 2018-02-26 DIAGNOSIS — M542 Cervicalgia: Secondary | ICD-10-CM | POA: Diagnosis not present

## 2018-02-26 DIAGNOSIS — R252 Cramp and spasm: Secondary | ICD-10-CM | POA: Diagnosis present

## 2018-02-26 DIAGNOSIS — R293 Abnormal posture: Secondary | ICD-10-CM

## 2018-02-26 NOTE — Therapy (Signed)
[facility] [address] [place], [place], [address] Phone: [phone]   Fax:  [phone]  Physical Therapy Evaluation  Patient Details  Name: Jennifer Sims MRN: [id number] Date of Birth: [birth year] Referring Provider: Anson Fret, MD   Encounter Date: 02/26/2018  PT End of Session - 02/26/18 0959    Visit Number  1    Number of Visits  12    Date for PT Re-Evaluation  04/09/18    PT Start Time  0802    PT Stop Time  0845    PT Time Calculation (min)  43 min    Activity Tolerance  Patient tolerated treatment well    Behavior During Therapy  [facility] for tasks assessed/performed       Past Medical History:  Diagnosis Date  . Crohn's disease (HCC)   . Migraine   . Osteopenia     Past Surgical History:  Procedure Laterality Date  . CHOLECYSTECTOMY  2009    There were no vitals filed for this visit.   Subjective Assessment - 02/26/18 0805    Subjective  Pt is a 52 y/o female who presents to for headaches and neck pain.  Pt reprots MD and husband have noticed a forward head posture, and pt c/o neck pain described as discomfort and tension.  Pt very interested in dry needling.    Limitations  Sitting    How long can you sit comfortably?  increased pain with computer    Patient Stated Goals  improve posture    Currently in Pain?  Yes    Pain Score  0-No pain c/o tension in neck    Pain Location  Neck    Pain Orientation  Posterior    Pain Descriptors / Indicators  Tightness    Pain Type  Chronic pain    Pain Onset  More than a month ago    Pain Frequency  Intermittent    Aggravating Factors   sitting at computer/looking down, driving    Pain Relieving Factors  nothing         [facility] PT Assessment - 02/26/18 0809      Assessment   Medical Diagnosis  Myofascial pain syndrome, cervical    Referring Provider  Anson Fret, MD    Onset Date/Surgical Date  -- "years"    Hand Dominance  Right     Next MD Visit  05/09/18    Prior Therapy  none for this condition      Precautions   Precautions  None      Restrictions   Weight Bearing Restrictions  No      Balance Screen   Has the patient fallen in the past 6 months  No    Has the patient had a decrease in activity level because of a fear of falling?   No    Is the patient reluctant to leave their home because of a fear of falling?   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; has office hours    Leisure  spend time with family      Cognition  Overall Cognitive Status  Within Functional Limits for tasks assessed      Posture/Postural Control   Posture/Postural Control  Postural limitations    Postural Limitations  Rounded Shoulders;Forward head;Increased thoracic kyphosis      ROM / Strength   AROM / PROM / Strength  AROM;Strength      AROM   AROM Assessment Site  Cervical    Cervical Flexion  28    Cervical Extension  66    Cervical - Right Side Bend  37    Cervical - Left Side Bend  35 with mild pain      Strength   Strength Assessment Site  Shoulder;Elbow    Right/Left Shoulder  Right;Left    Right Shoulder Flexion  4+/5    Right Shoulder ABduction  3+/5    Right Shoulder Internal Rotation  5/5    Right Shoulder External Rotation  5/5    Left Shoulder Flexion  4+/5    Left Shoulder ABduction  3+/5    Left Shoulder Internal Rotation  5/5    Left Shoulder External Rotation  5/5    Right/Left Elbow  Right;Left    Right Elbow Flexion  5/5    Right Elbow Extension  5/5    Left Elbow Flexion  5/5    Left Elbow Extension  5/5      Palpation   Palpation comment  active trigger points in bil UT, levator scapulae  and rhomboids; Rt>Lt      Special Tests    Special Tests  Cervical    Cervical Tests  Spurling's;Dictraction      Spurling's   Findings  Negative      Distraction Test   Findngs  Negative             Objective measurements completed on examination: See above findings.      [facility] Adult PT Treatment/Exercise - 02/26/18 0809      Exercises   Exercises  Neck      Manual Therapy   Manual Therapy  Soft tissue mobilization;Myofascial release    Manual therapy comments  skilled palpation of soft tissue during DN    Soft tissue mobilization  bil upper trap and levator    Myofascial Release  bil upper trap and levator scapula      Neck Exercises: Stretches   Upper Trapezius Stretch  Right;1 rep;30 seconds    Levator Stretch  Right;1 rep;30 seconds       Trigger Point Dry Needling - 02/26/18 0957    Consent Given?  Yes    Education Handout Provided  Yes    Muscles Treated Upper Body  Upper trapezius;Levator scapulae    Upper Trapezius Response  Twitch reponse elicited;Palpable increased muscle length    Levator Scapulae Response  Twitch response elicited;Palpable increased muscle length           PT Education - 02/26/18 0959    Education provided  Yes    Education Details  DN, stretches    Person(s) Educated  Patient    Methods  Explanation;Demonstration;Handout    Comprehension  Verbalized understanding;Returned demonstration;Need further instruction          PT Long Term Goals - 02/26/18 1003      PT LONG TERM GOAL #1   Title  independent with HEP    Status  New    Target Date  04/09/18      PT LONG TERM GOAL #2   Title  verbalize understanding of  posture/body mechanics to decrease risk of reinjury    Status  New    Target Date  04/09/18      PT LONG TERM GOAL #3   Title  report 50% improvement in pain and tightness for improved function    Status  New    Target Date  04/09/18             Plan - 02/26/18 1000    Clinical Impression  Statement  Pt is a 52 y/o female who presents to OPPT for neck pain and headaches.  Pt demonstrates postural abnormalities, decreased ROM and active trigger points affecting functional mobility.  Pt will benefit from PT to address deficits listed.    History and Personal Factors relevant to plan of care:  migraines, osteopenia    Clinical Presentation  Stable    Clinical Decision Making  Low    Rehab Potential  Good    PT Frequency  2x / week    PT Duration  6 weeks    PT Treatment/Interventions  ADLs/Self Care Home Management;Cryotherapy;Electrical Stimulation;Ultrasound;Traction;Moist Heat;Functional mobility training;Therapeutic activities;Therapeutic exercise;Patient/family education;Neuromuscular re-education;Passive range of motion;Dry needling;Manual techniques;Taping    PT Next Visit Plan  review stretches, assess response to DN, DN/manual/modalities PRN, postural exercises    Consulted and Agree with Plan of Care  Patient       Patient will benefit from skilled therapeutic intervention in order to improve the following deficits and impairments:  Pain, Postural dysfunction, Increased muscle spasms, Increased fascial restricitons, Decreased range of motion  Visit Diagnosis: Cervicalgia - Plan: PT plan of care cert/re-cert  Abnormal posture - Plan: PT plan of care cert/re-cert  Cramp and spasm - Plan: PT plan of care cert/re-cert     Problem List Patient Active Problem List   Diagnosis Date Noted  . Chronic migraine without aura, with intractable migraine, so stated, with status migrainosus 08/03/2016      Clarita Crane, PT, DPT 02/26/18 10:09 AM    [facility] [address] [place], [place], [address] Phone: [phone]   Fax:  [phone]  Name: Jennifer Sims MRN: [id number] Date of Birth: 06-Apr-1966

## 2018-02-26 NOTE — Patient Instructions (Addendum)
Trigger Point Dry Needling  . What is Trigger Point Dry Needling (DN)? o DN is a physical therapy technique used to treat muscle pain and dysfunction. Specifically, DN helps deactivate muscle trigger points (muscle knots).  o A thin filiform needle is used to penetrate the skin and stimulate the underlying trigger point. The goal is for a local twitch response (LTR) to occur and for the trigger point to relax. No medication of any kind is injected during the procedure.   . What Does Trigger Point Dry Needling Feel Like?  o The procedure feels different for each individual patient. Some patients report that they do not actually feel the needle enter the skin and overall the process is not painful. Very mild bleeding may occur. However, many patients feel a deep cramping in the muscle in which the needle was inserted. This is the local twitch response.   Marland Kitchen How Will I feel after the treatment? o Soreness is normal, and the onset of soreness may not occur for a few hours. Typically this soreness does not last longer than two days.  o Bruising is uncommon, however; ice can be used to decrease any possible bruising.  o In rare cases feeling tired or nauseous after the treatment is normal. In addition, your symptoms may get worse before they get better, this period will typically not last longer than 24 hours.   . What Can I do After My Treatment? o Increase your hydration by drinking more water for the next 24 hours. o You may place ice or heat on the areas treated that have become sore, however, do not use heat on inflamed or bruised areas. Heat often brings more relief post needling. o You can continue your regular activities, but vigorous activity is not recommended initially after the treatment for 24 hours. o DN is best combined with other physical therapy such as strengthening, stretching, and other therapies.     Flexibility: Upper Trapezius Stretch    Gently grasp right side of head while  reaching behind back with other hand. Tilt head away until a gentle stretch is felt. Hold _20-30___ seconds. Repeat __2-3__ times per set. Do _1___ sets per session. Do __2-3__ sessions per day.  Levator Scapula Stretch, Sitting    Sit, one hand tucked under hip on side to be stretched, other hand over top of head. Turn head toward other side and look down. Use hand on head to gently stretch neck in that position. Hold __20-30_ seconds. Repeat _2-3__ times per session. Do _2-3__ sessions per day.  Copyright  VHI. All rights reserved.

## 2018-03-07 ENCOUNTER — Encounter: Payer: Self-pay | Admitting: Physical Therapy

## 2018-03-07 ENCOUNTER — Ambulatory Visit: Payer: BC Managed Care – PPO | Admitting: Physical Therapy

## 2018-03-07 DIAGNOSIS — M542 Cervicalgia: Secondary | ICD-10-CM

## 2018-03-07 DIAGNOSIS — R252 Cramp and spasm: Secondary | ICD-10-CM

## 2018-03-07 DIAGNOSIS — R293 Abnormal posture: Secondary | ICD-10-CM

## 2018-03-07 NOTE — Therapy (Signed)
[facility] [address] [place], [place], [address] Phone: [phone]   Fax:  [phone]  Physical Therapy Treatment  Patient Details  Name: Jennifer Sims MRN: [id number] Date of Birth: 1966-07-21 Referring Provider: Anson Fret, MD   Encounter Date: 03/07/2018  PT End of Session - 03/07/18 0919    Visit Number  2    Number of Visits  12    Date for PT Re-Evaluation  04/09/18    PT Start Time  0845    PT Stop Time  0925    PT Time Calculation (min)  40 min    Activity Tolerance  Patient tolerated treatment well    Behavior During Therapy  [facility] for tasks assessed/performed       Past Medical History:  Diagnosis Date  . Crohn's disease (HCC)   . Migraine   . Osteopenia     Past Surgical History:  Procedure Laterality Date  . CHOLECYSTECTOMY  2009    There were no vitals filed for this visit.  Subjective Assessment - 03/07/18 0847    Subjective  DN felt good; Rt side feels much better.  Lt side still feels tight, wants DN today to these muscles    Patient Stated Goals  improve posture    Pain Score  0-No pain c/o tightness only                      [facility] Adult PT Treatment/Exercise - 03/07/18 0917      Neck Exercises: Supine   Neck Retraction  10 reps;5 secs    Other Supine Exercise  scap retraction 10 x 5 sec      Manual Therapy   Manual Therapy  Soft tissue mobilization;Myofascial release    Manual therapy comments  skilled palpation of soft tissue during DN    Soft tissue mobilization  bil upper trap and levator, rhomboids and supraspinatus    Myofascial Release  bil upper trap and levator scapula      Neck Exercises: Stretches   Upper Trapezius Stretch  Right;Left;1 rep;30 seconds    Levator Stretch  Right;Left;1 rep;30 seconds       Trigger Point Dry Needling - 03/07/18 0916    Consent Given?  Yes    Muscles Treated Upper Body  Upper trapezius;Levator  scapulae;Supraspinatus;Rhomboids    Upper Trapezius Response  Twitch reponse elicited;Palpable increased muscle length    Levator Scapulae Response  Twitch response elicited;Palpable increased muscle length    Rhomboids Response  Twitch response elicited;Palpable increased muscle length    Supraspinatus Response  Twitch response elicited;Palpable increased muscle length           PT Education - 03/07/18 0919    Education provided  Yes    Education Details  reprinted HEP from initial visit    Person(s) Educated  Patient    Methods  Explanation;Demonstration;Handout    Comprehension  Verbalized understanding;Returned demonstration;Need further instruction          PT Long Term Goals - 02/26/18 1003      PT LONG TERM GOAL #1   Title  independent with HEP    Status  New    Target Date  04/09/18      PT LONG TERM GOAL #2   Title  verbalize understanding of posture/body mechanics to decrease risk of reinjury    Status  New    Target Date  04/09/18      PT LONG  TERM GOAL #3   Title  report 50% improvement in pain and tightness for improved function    Status  New    Target Date  04/09/18            Plan - 03/07/18 0920    Clinical Impression Statement  Pt reported decreased tightness which was observed with palpation on Rt side neck today, with increased Lt sided tightness. DN performed today to Lt UT/LS/rhomboids and supraspinatue with pt reporting decreased tightness following.  Progressing well towards goals.    PT Treatment/Interventions  ADLs/Self Care Home Management;Cryotherapy;Electrical Stimulation;Ultrasound;Traction;Moist Heat;Functional mobility training;Therapeutic activities;Therapeutic exercise;Patient/family education;Neuromuscular re-education;Passive range of motion;Dry needling;Manual techniques;Taping    PT Next Visit Plan  review stretches, assess response to DN, DN/manual/modalities PRN, postural exercises    Consulted and Agree with Plan of Care   Patient       Patient will benefit from skilled therapeutic intervention in order to improve the following deficits and impairments:  Pain, Postural dysfunction, Increased muscle spasms, Increased fascial restricitons, Decreased range of motion  Visit Diagnosis: Cervicalgia  Abnormal posture  Cramp and spasm     Problem List Patient Active Problem List   Diagnosis Date Noted  . Chronic migraine without aura, with intractable migraine, so stated, with status migrainosus 08/03/2016      Clarita Crane, PT, DPT 03/07/18 9:27 AM    [facility] [address] [place], [place], [address] Phone: [phone]   Fax:  [phone]  Name: Jennifer Sims MRN: [id number] Date of Birth: May 04, 1966

## 2018-03-12 ENCOUNTER — Ambulatory Visit: Payer: BC Managed Care – PPO | Admitting: Physical Therapy

## 2018-03-13 ENCOUNTER — Encounter: Payer: Self-pay | Admitting: Neurology

## 2018-03-13 ENCOUNTER — Ambulatory Visit: Payer: BC Managed Care – PPO | Admitting: Physical Therapy

## 2018-03-13 ENCOUNTER — Ambulatory Visit: Payer: BC Managed Care – PPO | Admitting: Neurology

## 2018-03-13 ENCOUNTER — Encounter: Payer: Self-pay | Admitting: Physical Therapy

## 2018-03-13 VITALS — BP 148/97 | HR 67 | Ht 62.0 in | Wt 134.0 lb

## 2018-03-13 DIAGNOSIS — M542 Cervicalgia: Secondary | ICD-10-CM

## 2018-03-13 DIAGNOSIS — Z79899 Other long term (current) drug therapy: Secondary | ICD-10-CM

## 2018-03-13 DIAGNOSIS — R519 Headache, unspecified: Secondary | ICD-10-CM

## 2018-03-13 DIAGNOSIS — R51 Headache: Secondary | ICD-10-CM

## 2018-03-13 DIAGNOSIS — R293 Abnormal posture: Secondary | ICD-10-CM

## 2018-03-13 DIAGNOSIS — G4719 Other hypersomnia: Secondary | ICD-10-CM

## 2018-03-13 DIAGNOSIS — R252 Cramp and spasm: Secondary | ICD-10-CM

## 2018-03-13 DIAGNOSIS — G4753 Recurrent isolated sleep paralysis: Secondary | ICD-10-CM

## 2018-03-13 DIAGNOSIS — R0683 Snoring: Secondary | ICD-10-CM

## 2018-03-13 NOTE — Therapy (Signed)
[facility] [address] [place], [place], [address] Phone: [phone]   Fax:  [phone]  Physical Therapy Treatment  Patient Details  Name: Jennifer Sims MRN: [id number] Date of Birth: May 20, 1966 Referring Provider: Anson Fret, MD   Encounter Date: 03/13/2018  PT End of Session - 03/13/18 1559    Visit Number  3    Number of Visits  12    Date for PT Re-Evaluation  04/09/18    PT Start Time  1405    PT Stop Time  1445    PT Time Calculation (min)  40 min    Activity Tolerance  Patient tolerated treatment well    Behavior During Therapy  [facility] for tasks assessed/performed       Past Medical History:  Diagnosis Date  . Crohn's disease (HCC)   . Migraine   . Osteopenia     Past Surgical History:  Procedure Laterality Date  . CHOLECYSTECTOMY  2009    There were no vitals filed for this visit.  Subjective Assessment - 03/13/18 1404    Subjective  Pt reports no falls and no changes in medications. Pt also stated Dry Needling felt great and help decrease neck pain. Pt stated neck and shoulder feeling more like muscle tightness.     Limitations  Sitting    How long can you sit comfortably?  increased pain with computer    Patient Stated Goals  improve posture    Pain Score  4     Pain Location  Neck    Pain Descriptors / Indicators  Tightness    Pain Onset  More than a month ago    Pain Frequency  Intermittent    Aggravating Factors   sitting at computer/looking down, driving         [facility] Adult PT Treatment/Exercise - 03/13/18 1545      Posture/Postural Control   Posture/Postural Control  Postural limitations    Postural Limitations  Rounded Shoulders;Forward head;Increased thoracic kyphosis    Posture Comments  Performed Wall Posture exercise having patient stand with back against wall with heel, hip, back, and back of head touching wall, and bilateral arm for 20 sec holds x 3 to gain  postural awareness.       Exercises   Exercises  Neck      Neck Exercises: Theraband   Scapula Retraction  10 reps;Other (comment) Seated with 1/2 foam roll to midback    Shoulder Extension  10 reps;Red;Other (comment) In standing    Rows  10 reps;Red;Other (comment) In standing    Horizontal ABduction  Red;10 reps;Other (comment) In standing    Other Theraband Exercises  Diagonal pulls with resisted Red theraband (Resisted scapular reciprocal motion on HEP) x 10 reps bilaterally. In standing position, with initial verbal and demo cues perform proper performance of task.       Neck Exercises: Seated   Neck Retraction  10 reps;5 secs;Other (comment) Seated chin tucks      Manual Therapy   Manual Therapy  Soft tissue mobilization;Myofascial release    Manual therapy comments  Bilateral upper trap restrictions and trigger points addressed with soft tissue mobilization to deep tissue mobilization.     Soft tissue mobilization  bil upper trap and levator, rhomboids and supraspinatus    Myofascial Release  bil upper trap and levator scapula      Neck Exercises: Stretches   Upper Trapezius Stretch  Right;Left;2 reps;20 seconds  Levator Stretch  Right;Left;2 reps;20 seconds         PT Education - 03/13/18 1556    Education provided  Yes    Education Details  Added to HEP: Theraband shoulder extension, rows, hor. abduction, and diagonals. Pt required initial verbal and demonstration cues for proper performance of each exercise.     Person(s) Educated  Patient    Methods  Explanation;Verbal cues;Handout;Demonstration    Comprehension  Verbalized understanding;Returned demonstration;Verbal cues required          PT Long Term Goals - 02/26/18 1003      PT LONG TERM GOAL #1   Title  independent with HEP    Status  New    Target Date  04/09/18      PT LONG TERM GOAL #2   Title  verbalize understanding of posture/body mechanics to decrease risk of reinjury    Status  New     Target Date  04/09/18      PT LONG TERM GOAL #3   Title  report 50% improvement in pain and tightness for improved function    Status  New    Target Date  04/09/18          [employer]OUR [employer] Created by Dorian Pod, SPTA Mar 19th, 2019   ELASTIC BAND EXTENSION BILATERAL SHOULDER (With Red theraband)  While holding an elastic band with both arms in front of you with your elbows straight, pull the band downwards and back towards your side. Repeat 10 Times Hold 3 Seconds Complete 2 Sets Perform 1 Time(s) a Day   Scapular Retraction (Rows with red theraband)   Wrap an elastic band around a door knob or banister. Grab the ends of the band with both hands with your arms extended. With good posture, pull the band backwards and squeeze your shoulder blades together for 3 seconds. Make sure your elbows stay close to your body. Repeat 10 Times Hold 3 Seconds Complete 2 Sets Perform 1 Time(s) a Day   ELASTIC BAND HORIZONTAL ABDUCTION (With red theraband)  While holding an elastic band in front of you and elbows straight, pull the band outward away from your body as shown. Powered by [email or web address].com Created By Dorian Pod, SPTA Mar 19th, 2019 - Page 1 of 2 Repeat 10 Times Hold 3 Seconds Complete 2 Sets Perform 1 Time(s) a Day   Resisted Scapular Reciprocal Motion (Diagonals with red theraband)   Start with palms up with mild slack in the elastic band. Start with horizontal abduction and return. Then alternate right arm and left arm bringing one arm up and the other down in a reciprocating pattern. Refer to video with any questions. Powered by [email or web address].com Created By Dorian Pod, SPTA Mar 19th, 2019 - Page 2 of 2      Plan - 03/13/18 1603    Clinical Impression Statement  Today's skilled session focused on manual therapy to address bilateral neck and upper trapezius tissue restrictions, scapular strengthening for improved posture, and addition to current HEP for  shoulder and scapular strengthening. Pt was able to maintain cervical pain level at baseline during entire session. Manual therapy helped to clear noted bilateral upper trap tissue restrictions and trigger points wihch resulted in increase tissue glide and pliablity after manual. After manual therapy, pt was able to tolerate increased strengthening exercises without any issues. Pt is making progress towards LTG's and would benefit from continued skilled PT to accomplish unmet goals.     History and Personal Factors relevant  to plan of care:  migranes, osteopenia    Clinical Presentation  Stable    Clinical Decision Making  Low    Rehab Potential  Good    PT Frequency  2x / week    PT Duration  6 weeks    PT Treatment/Interventions  ADLs/Self Care Home Management;Cryotherapy;Electrical Stimulation;Ultrasound;Traction;Moist Heat;Functional mobility training;Therapeutic activities;Therapeutic exercise;Patient/family education;Neuromuscular re-education;Passive range of motion;Dry needling;Manual techniques;Taping    PT Next Visit Plan  Follow up on addition of HEP , assess response to DN, DN/manual/modalities PRN, postural exercises    Consulted and Agree with Plan of Care  Patient         Patient will benefit from skilled therapeutic intervention in order to improve the following deficits and impairments:  Pain, Postural dysfunction, Increased muscle spasms, Increased fascial restricitons, Decreased range of motion  Visit Diagnosis: Cervicalgia  Abnormal posture  Cramp and spasm     Problem List Patient Active Problem List   Diagnosis Date Noted  . 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 Excessive daytime sleepiness 03/13/2018  . Snoring 03/13/2018  . Chronic migraine without aura, with intractable migraine, so stated, with status migrainosus 08/03/2016    Dorian Pod, SPTA  03/13/2018, 5:39 PM  [facility] [address] [place], [place], [address] Phone: [phone]   Fax:  [phone]  Name: Jennifer Sims MRN: [id number] Date of Birth: [birth year]

## 2018-03-13 NOTE — Progress Notes (Addendum)
SLEEP MEDICINE CLINIC   Provider:  Larey Seat, M D  Primary Care Physician:  Idelle Crouch, MD   Referring Provider: Sarina Ill, MD    Chief Complaint  Patient presents with  . New Patient (Initial Visit)    pt alone, rm 11. pt states she wakes up frequently with headaches. pt states husband says she snores in sleep.     HPI:  Jennifer Sims , Jennifer Sims, is a 52 y.o. female , seen here  in a referral  from Dr. Jaynee Eagles, who teats the patient with botox injections for Migraine.   Jennifer Sims is a 52 year old female patient of Jennifer Sims who  Works as a professor at [employer] , and reportedly has been snoring ( husband) and wakes up with headaches, a dry mouth, the morning headaches are frequently accompanied by nausea.  In addition she endorses a very high degree of sleepiness is 23 out of 24 points on the Epworth sleepiness score but only 31 points on the fatigue severity score.  She has lots of not neck muscle tightness and cervicalgia and Dr. Jaynee Eagles has recommended PT, dry needling, improving posture and has applied Botox injections.  Her last prescribed medication was Indomethacin.   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  She is front of a screen for much of the evening. She watches TV news before bedtime, and goes to bed by 11.30 PM- all week the same. The bedroom is cool , quiet,  but not dark.  Her husband watches TV in the bedroom , until 2 Am some night. She denies Restless legs.  Nocturia none or 1 times a per night. She rises at 5.30 AM on weekdays, and on week ends 6 AM. Lately not longer able to recall dreaming at all.  She wakes some days with morning headaches ( 3 a week ) and these resolve over the  day. Not woken by headaches.       Sleep medical history and family sleep history: no sleepwalking history, no tonsillectomy. Neck injuries: none, TBI: none. Isolated sleep paralysis. Crohn's disease.    Social history: her sister passed last weekend- following a MVA last summer- severely injured, she had a difficult week.  Married to a MD radiologist , 2 children, age 49 and 31 , non smoker, social drinker, caffeine : daily- coffee in AM, none after lunch, decaffeinated tea at home, no soda in the home.    Review of Systems: Out of a complete 14 system review, the patient complains of only the following symptoms, and all other reviewed systems are negative. Headache,  Taste changed with topamax.   How likely are you to doze in the following situations: 0 = not likely, 1 = slight chance, 2 = moderate chance, 3 = high chance  Sitting and Reading?3 Watching Television?3 Sitting inactive in a public place (theater or meeting)?3 Lying down in the afternoon when circumstances permit?3 Sitting and talking to someone?2 Sitting quietly after lunch without alcohol?3 In a car, while stopped for a few minutes in traffic?3 As a passenger in a car for an hour without a break?3  Total =Epworth score 23  , Fatigue severity score 31  ,  depression score N/a    Social History   Socioeconomic History  . Marital status: Married    Spouse name: [name]  . Number of children: 2  . Years of education: Jennifer Sims JD  . Highest education level: Not on file  Social Needs  . Financial resource strain: Not on file  . Food insecurity - worry: Not on file  . Food insecurity - inability: Not on file  . Transportation needs - medical: Not on file  . Transportation needs - non-medical: Not on file  Occupational History  . Occupation: University   Tobacco Use  . Smoking status: Never Smoker  . Smokeless tobacco: Never Used  Substance and Sexual Activity  . Alcohol use: Yes    Comment: 1 drink per month  .  Drug use: No  . Sexual activity: Not on file  Other Topics Concern  . Not on file  Social History Narrative   Lives with husband and children   Caffeine use: 1 cup tea per day    Family History  Problem Relation Age of Onset  . Diabetes Mellitus II Mother   . Hypertension Mother   . Asthma Father   . Osteoporosis Maternal Grandmother     Past Medical History:  Diagnosis Date  . Crohn's disease ([place])   . Migraine   . Osteopenia     Past Surgical History:  Procedure Laterality Date  . CHOLECYSTECTOMY  2009    Current Outpatient Medications  Medication Sig Dispense Refill  . BOTOX 100 units SOLR injection INJECT  IN THE HEAD AND NECK MUSCLES AS DIRECTED. 2 each 1  . Cholecalciferol (VITAMIN D3) 1000 units CAPS Take 1 capsule by mouth daily.    . Diclofenac Potassium 50 MG PACK Take 50 mg by mouth once as needed. For migraine. 9 each 11  . docusate sodium (COLACE) 100 MG capsule Take 100 mg by mouth daily.    . fexofenadine (ALLEGRA) 180 MG tablet Take 180 mg by mouth daily.    . Fremanezumab-vfrm (AJOVY) 225 MG/1.5ML SOSY Inject 225 mg into the skin every 30 (thirty) days. 1 Syringe 11  . indomethacin (INDOCIN) 25 MG capsule Take 1-2 capsules (25-'50mg'$ ) capsules up to three times a day. Maximum 200 mg per day. 60 capsule 6  . magnesium oxide (MAG-OX) 400 MG tablet Take 400 mg by mouth 2 (two) times daily.    . metoCLOPramide (REGLAN) 10 MG tablet Take once at onset of headache. 15 tablet 5  . Multiple Vitamins-Minerals (MULTIVITAMIN ADULTS PO) Take 1 tablet by mouth daily.    . Potassium Gluconate 550 MG TABS Take 1 tablet by mouth daily.    . prochlorperazine (COMPAZINE) 10 MG tablet Take 1 tablet (10 mg total) by mouth every 6 (six) hours as needed for nausea or vomiting. Take at onset of migraine 30 tablet 0  . propranolol (INDERAL) 10 MG tablet TAKE 1 TABLET (10 MG TOTAL) BY MOUTH 3 (THREE) TIMES DAILY AS NEEDED. AT ONSET OF MIGRAINE. 30 tablet 6  . RIBOFLAVIN PO Take 200  mg by mouth daily.    Marland Kitchen topiramate (TOPAMAX) 50 MG tablet Take 1 tablet (50 mg total) by mouth at bedtime. 30 tablet 11  . Tretinoin-Cleanser-Moisturizer 0.025 % CREAM KIT Apply topically daily.    Marland Kitchen UNABLE TO FIND Take 1 tablet by mouth daily. Med Name: 2.4 gm  Lialda    . UNABLE TO FIND Apply 1 application topically every other day. Med Name: Minoxidil Applied to hairline    .  vitamin B-12 (CYANOCOBALAMIN) 1000 MCG tablet Take 1,000 mcg by mouth daily.     No current facility-administered medications for this visit.     Allergies as of 03/13/2018 - Review Complete 03/13/2018  Allergen Reaction Noted  . Sulfur Nausea Only 07/16/2014    Vitals: BP (!) 148/97   Pulse 67   Ht '5\' 2"'$  (1.575 m)   Wt 134 lb (60.8 kg)   BMI 24.51 kg/m  Last Weight:  Wt Readings from Last 1 Encounters:  03/13/18 134 lb (60.8 kg)   ZJQ:BHAL mass index is 24.51 kg/m.     Last Height:   Ht Readings from Last 1 Encounters:  03/13/18 '5\' 2"'$  (1.575 m)    Physical exam:  General: The patient is awake, alert and appears not in acute distress. The patient is well groomed. Head: Normocephalic, atraumatic. Neck is supple. Mallampati 3  neck circumference:12. 5 . Nasal airflow patent ,   Cardiovascular:  Regular rate and rhythm , without  murmurs or carotid bruit, and without distended neck veins. Respiratory: Lungs are clear to auscultation. Skin:  Without evidence of edema, or rash- palmar erythema.  Trunk: BMI is 24.5 . The patient's posture is erect.   Neurologic exam : The patient is awake and alert, oriented to place and time.   Memory subjective described as intact.   Attention span & concentration ability appears normal.  Speech is fluent,  without  dysarthria, dysphonia or aphasia.  Mood and affect are appropriate.  Cranial nerves: Pupils are equal and briskly reactive to light. Funduscopic exam without evidence of pallor or edema. Extraocular movements  in vertical and horizontal planes intact  and without nystagmus. Visual fields by finger perimetry are intact.Hearing to finger rub intact.   Facial sensation intact to fine touch. Facial motor strength is symmetric and tongue and uvula move midline. Shoulder shrug was symmetrical.  Motor exam: Normal tone, muscle bulk and symmetric strength in all extremities. bilateral strong grip.  Sensory:  Fine touch, pinprick and vibration were tested in all extremities. Proprioception tested in the upper extremities was normal. Coordination: Rapid alternating movements in the fingers/hands was normal. Finger-to-nose maneuver  normal without evidence of ataxia, dysmetria or tremor. Gait and station: Patient walks without assistive device Deep tendon reflexes: in the  upper and lower extremities are symmetric and intact.    Assessment:  After physical and neurologic examination, review of laboratory studies,  Personal review of imaging studies, reports of other /same  Imaging studies, results of polysomnography and / or neurophysiology testing and pre-existing records as far as provided in visit., my assessment is   1) I suspect that Jennifer Sims may have hypoxemia at night or hypercapnia, given her history of morning headaches, excessive fatigue and very high degree of sleepiness.  The high degree of sleepiness is interestingly not associated with dream pressure.  Most recently she has not been able to recall any dreams.  This may be related to topiramate or any other medication that could be REM suppressant.  Her headaches have improved under topiramate I will not change the current medication and she is continued to receive Botox therapy with Dr. Jaynee Eagles.    I will concentrate all the possible presence of sleep apnea in a patient with a small neck, low-grade Mallampati and normal body mass index.  I will invite her for an attended sleep study but could be performed as a split-night polysomnography with capnography. Need Co2 , time above 50 torr ,  peak in  NREM or REM ,   2) HLA narcolepsy - and  MSLT.      The patient was advised of the nature of the diagnosed disorder , the treatment options and the  risks for general health and wellness arising from not treating the condition.   I spent more than 45 minutes of face to face time with the patient.  Greater than 50% of time was spent in counseling and coordination of care. We have discussed the diagnosis and differential and I answered the patient's questions.    Plan:  Treatment plan and additional workup : Need Co2 , time above 50 torr , peak in NREM or REM , MSLT to follow, HLA.     Larey Seat, MD 5/53/7482, 7:07 AM  Certified in Neurology by ABPN Certified in [place] by [facility] [address] [place], [place] [address]

## 2018-03-13 NOTE — Patient Instructions (Signed)
[email or web address] Created by Dorian Pod, SPTA Mar 19th, 2019   ELASTIC BAND EXTENSION BILATERAL SHOULDER (With Red theraband)  While holding an elastic band with both arms in front of you with your elbows straight, pull the band downwards and back towards your side. Repeat 10 Times Hold 3 Seconds Complete 2 Sets Perform 1 Time(s) a Day   Scapular Retraction (Rows with red theraband)   Wrap an elastic band around a door knob or banister. Grab the ends of the band with both hands with your arms extended. With good posture, pull the band backwards and squeeze your shoulder blades together for 3 seconds. Make sure your elbows stay close to your body. Repeat 10 Times Hold 3 Seconds Complete 2 Sets Perform 1 Time(s) a Day   ELASTIC BAND HORIZONTAL ABDUCTION (With red theraband)  While holding an elastic band in front of you and elbows straight, pull the band outward away from your body as shown. Powered by [email or web address].com Created By Dorian Pod, SPTA Mar 19th, 2019 - Page 1 of 2 Repeat 10 Times Hold 3 Seconds Complete 2 Sets Perform 1 Time(s) a Day   Resisted Scapular Reciprocal Motion (Diagonals with red theraband)   Start with palms up with mild slack in the elastic band. Start with horizontal abduction and return. Then alternate right arm and left arm bringing one arm up and the other down in a reciprocating pattern. Refer to video with any questions. Powered by [email or web address].com Created By Dorian Pod, SPTA Mar 19th, 2019 - Page 2 of 2

## 2018-03-14 ENCOUNTER — Encounter: Payer: Self-pay | Admitting: Physical Therapy

## 2018-03-14 ENCOUNTER — Ambulatory Visit: Payer: BC Managed Care – PPO | Admitting: Physical Therapy

## 2018-03-14 DIAGNOSIS — M542 Cervicalgia: Secondary | ICD-10-CM

## 2018-03-14 DIAGNOSIS — R252 Cramp and spasm: Secondary | ICD-10-CM

## 2018-03-14 DIAGNOSIS — R293 Abnormal posture: Secondary | ICD-10-CM

## 2018-03-14 NOTE — Therapy (Signed)
[facility] [address] [place], [place], [address] Phone: [phone]   Fax:  [phone]  Physical Therapy Treatment  Patient Details  Name: Jennifer Sims MRN: [id number] Date of Birth: [birth year] Referring Provider: Anson Fret, MD   Encounter Date: 03/14/2018  PT End of Session - 03/14/18 1531    Visit Number  4    Number of Visits  12    Date for PT Re-Evaluation  04/09/18    PT Start Time  1400    PT Stop Time  1442    PT Time Calculation (min)  42 min    Activity Tolerance  Patient tolerated treatment well    Behavior During Therapy  [facility] for tasks assessed/performed       Past Medical History:  Diagnosis Date  . Crohn's disease ([facility])   . Migraine   . Osteopenia     Past Surgical History:  Procedure Laterality Date  . CHOLECYSTECTOMY  2009    There were no vitals filed for this visit.  Subjective Assessment - 03/14/18 1358    Subjective  had some Lt neck soreness which seems to be resolved.  exercises are going pretty well.      Patient Stated Goals  improve posture    Currently in Pain?  No/denies                      [facility] Adult PT Treatment/Exercise - 03/14/18 1442      Self-Care   Self-Care  Other Self-Care Comments    Other Self-Care Comments   use of tennis ball for suboccipital release; use of theracane for trigger point release      Manual Therapy   Manual Therapy  Soft tissue mobilization;Myofascial release    Manual therapy comments  skilled palpation and monitoring of soft tissue during TPDN    Soft tissue mobilization  bil upper trap and levator, rhomboids and supraspinatus    Myofascial Release  bil upper trap and levator scapula; suboccipital release       Trigger Point Dry Needling - 03/14/18 1531    Consent Given?  Yes    Muscles Treated Upper Body  Upper trapezius;Suboccipitals muscle group    Upper Trapezius Response  Twitch reponse  elicited;Palpable increased muscle length    SubOccipitals Response  Twitch response elicited;Palpable increased muscle length           PT Education - 03/14/18 1531    Education provided  Yes    Education Details  see self care    Person(s) Educated  Patient    Methods  Explanation;Handout    Comprehension  Verbalized understanding          PT Long Term Goals - 02/26/18 1003      PT LONG TERM GOAL #1   Title  independent with HEP    Status  New    Target Date  04/09/18      PT LONG TERM GOAL #2   Title  verbalize understanding of posture/body mechanics to decrease risk of reinjury    Status  New    Target Date  04/09/18      PT LONG TERM GOAL #3   Title  report 50% improvement in pain and tightness for improved function    Status  New    Target Date  04/09/18            Plan - 03/14/18 1532    Clinical Impression  Statement  Pt tolerated session well today with twitch responses in all muscle groups DN today, but decreased soreness following.  Overall demonstrating great progress with PT and improvments in pain and furnction.    PT Treatment/Interventions  ADLs/Self Care Home Management;Cryotherapy;Electrical Stimulation;Ultrasound;Traction;Moist Heat;Functional mobility training;Therapeutic activities;Therapeutic exercise;Patient/family education;Neuromuscular re-education;Passive range of motion;Dry needling;Manual techniques;Taping    PT Next Visit Plan  Follow up on addition of HEP , assess response to DN, DN/manual/modalities PRN, postural exercises (if needs more DN will need to go to [facility])    Consulted and Agree with Plan of Care  Patient       Patient will benefit from skilled therapeutic intervention in order to improve the following deficits and impairments:  Pain, Postural dysfunction, Increased muscle spasms, Increased fascial restricitons, Decreased range of motion  Visit Diagnosis: Cervicalgia  Abnormal posture  Cramp and spasm     Problem  List Patient Active Problem List   Diagnosis Date Noted  . 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 Excessive daytime sleepiness 03/13/2018  . Snoring 03/13/2018  . Chronic migraine without aura, with intractable migraine, so stated, with status migrainosus 08/03/2016      Clarita Crane, PT, DPT 03/14/18 3:43 PM    [facility] [address] [place], [place], [address] Phone: [phone]   Fax:  [phone]  Name: Jennifer Sims MRN: [id number] Date of Birth: 30-Oct-1966

## 2018-03-14 NOTE — Patient Instructions (Signed)
  Thera [employer]ane (~$25 on [employer])   

## 2018-03-19 ENCOUNTER — Ambulatory Visit: Payer: BC Managed Care – PPO

## 2018-03-19 DIAGNOSIS — M542 Cervicalgia: Secondary | ICD-10-CM | POA: Diagnosis not present

## 2018-03-19 DIAGNOSIS — R293 Abnormal posture: Secondary | ICD-10-CM

## 2018-03-19 NOTE — Patient Instructions (Addendum)
Extension: Isometric (Supine / Sitting)    You don't need a helper. Seated: Pressing head straight back into PILLOW. -Keep chin tipped down. CAUTION: Use gentle pressure to avoid neck strain. Hold _5__ seconds. Relax. Repeat _5__ times. Do _1-2__ sessions per day. Variation: Lying down with head on pillow, press into pillow with chin tucked.    Copyright  VHI. All rights reserved.    ELASTIC BAND EXTENSION BILATERAL SHOULDER (With Red theraband)  While holding an elastic band with both arms in front of you with your elbows straight, pull the band downwards and back towards your side. Repeat 10 Times Hold 3 Seconds Complete 2 Sets Perform 1 Time(s) a Day   Scapular Retraction (Rows with red theraband)   Wrap an elastic band around a door knob or banister. Grab the ends of the band with both hands with your arms extended. With good posture, pull the band backwards and squeeze your shoulder blades together for 3 seconds. Make sure your elbows stay close to your body. Repeat 10 Times Hold 3 Seconds Complete 2 Sets Perform 1 Time(s) a Day   ELASTIC BAND HORIZONTAL ABDUCTION (With red theraband)  While holding an elastic band in front of you and elbows straight, pull the band outward away from your body as shown. Powered by [email or web address].com Created By Dorian Pod, SPTA Mar 19th, 2019 - Page 1 of 2 Repeat 10 Times Hold 3 Seconds Complete 2 Sets Perform 1 Time(s) a Day   Resisted Scapular Reciprocal Motion (Diagonals with red theraband)   Start with palms up with mild slack in the elastic band. Start with horizontal abduction and return. Then alternate right arm and left arm bringing one arm up and the other down in a reciprocating pattern. Refer to video with any questions. Powered by [email or web address].com Created By Dorian Pod, SPTA Mar 19th, 2019 - Page 2 of 2                 03/14/2018     Jennifer Sims, PT3/20/2019 2:39 PM Signed [employer] (~$25 on [place])        Flexibility: Upper Trapezius Stretch    Gently grasp right side of head while reaching behind back with other hand. Tilt head away until a gentle stretch is felt. Hold _20-30___ seconds. Repeat __2-3__ times per set. Do _1___ sets per session. Do __2-3__ sessions per day.  Levator Scapula Stretch, Sitting    Sit, one hand tucked under hip on side to be stretched, other hand over top of head. Turn head toward other side and look down. Use hand on head to gently stretch neck in that position. Hold __20-30_ seconds. Repeat _2-3__ times per session. Do _2-3__ sessions per day.  Copyright  VHI. All rights reserved.

## 2018-03-19 NOTE — Therapy (Signed)
[facility] [address] [place], [place], [address] Phone: [phone]   Fax:  [phone]  Physical Therapy Treatment  Patient Details  Name: Jennifer Sims MRN: [id number] Date of Birth: [birth year] Referring Provider: Anson Fret, MD   Encounter Date: 03/19/2018  PT End of Session - 03/19/18 1516    Visit Number  5    Number of Visits  12    Date for PT Re-Evaluation  04/09/18    PT Start Time  1410 pt and PT late    PT Stop Time  1440 pt had to leave early to pick up son    PT Time Calculation (min)  30 min    Activity Tolerance  Patient tolerated treatment well    Behavior During Therapy  [facility] for tasks assessed/performed       Past Medical History:  Diagnosis Date  . Crohn's disease ([facility])   . Migraine   . Osteopenia     Past Surgical History:  Procedure Laterality Date  . CHOLECYSTECTOMY  2009    There were no vitals filed for this visit.  Subjective Assessment - 03/19/18 1412    Subjective  Pt feels that dry needling has helped pain and she reports she was not able to perform HEP as she had a death in the family.     Patient Stated Goals  improve posture    Currently in Pain?  No/denies                No data recorded     Therex: Pt performed strengthening, flexibility exercises in standing and seated positions. Cues and demo for technique. Please see pt instructions for HEP details. No c/o pain during session.           PT Education - 03/19/18 1513    Education provided  Yes    Education Details  PT reviewed HEP and added cervical retraction to HEP. PT discussed switching 1x/week to [facility]. location for DN, per primary therapist's recommendation and pt's positive response to DN. PT provided pt with education regarding posture and sleeping position to decr. pain.     Person(s) Educated  Patient    Methods  Explanation;Handout;Verbal cues;Demonstration     Comprehension  Verbalized understanding;Returned demonstration          PT Long Term Goals - 02/26/18 1003      PT LONG TERM GOAL #1   Title  independent with HEP    Status  New    Target Date  04/09/18      PT LONG TERM GOAL #2   Title  verbalize understanding of posture/body mechanics to decrease risk of reinjury    Status  New    Target Date  04/09/18      PT LONG TERM GOAL #3   Title  report 50% improvement in pain and tightness for improved function    Status  New    Target Date  04/09/18            Plan - 03/19/18 1517    Clinical Impression Statement  Pt demonstrated progress as she reported no pain before, during, or after session. Today's skilled session focused on reviewing HEP, as pt had not performed since last visit. PT requesting pt switch to [facility]. location 1x/week for DN. Continue with POC.     PT Treatment/Interventions  ADLs/Self Care Home Management;Cryotherapy;Electrical Stimulation;Ultrasound;Traction;Moist Heat;Functional mobility training;Therapeutic activities;Therapeutic exercise;Patient/family education;Neuromuscular re-education;Passive range of motion;Dry needling;Manual  techniques;Taping    PT Next Visit Plan  DN/manual/modalities PRN, postural exercises, ensure pt received appt. at [facility].     Consulted and Agree with Plan of Care  Patient       Patient will benefit from skilled therapeutic intervention in order to improve the following deficits and impairments:  Pain, Postural dysfunction, Increased muscle spasms, Increased fascial restricitons, Decreased range of motion  Visit Diagnosis: Cervicalgia  Abnormal posture     Problem List Patient Active Problem List   Diagnosis Date Noted  . 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 Excessive daytime sleepiness 03/13/2018  . Snoring 03/13/2018  . Chronic migraine without aura, with intractable migraine, so stated, with status migrainosus 08/03/2016    Ryder Chesmore  L 03/19/2018, 3:19 PM  [facility] [address] [place], [place], [address] Phone: [phone]   Fax:  [phone]  Name: Jennifer Sims MRN: [id number] Date of Birth: 1966/03/01  Zerita Boers, PT,DPT 03/19/18 3:20 PM Phone: [phone] Fax: [phone]

## 2018-03-20 ENCOUNTER — Telehealth: Payer: Self-pay | Admitting: Neurology

## 2018-03-20 LAB — NARCOLEPSY EVALUATION
DQA1*01:02: NEGATIVE
DQB1*06:02: NEGATIVE

## 2018-03-20 NOTE — Telephone Encounter (Signed)
Called the patient and went over the lab work with her, informed her to keep her upcoming apt to have the narcolepsy testing completed. Pt verbalized understanding and was appreciative for the call.

## 2018-03-20 NOTE — Telephone Encounter (Signed)
-----  Message from Larey Seat, MD sent at 03/20/2018  8:49 AM EDT ----- HLA DQ narcolepsy test was negative- still, the gold standard to evaluate for narcolepsy is a MSLT.

## 2018-03-21 ENCOUNTER — Ambulatory Visit: Payer: BC Managed Care – PPO | Admitting: Physical Therapy

## 2018-03-23 ENCOUNTER — Ambulatory Visit: Payer: BC Managed Care – PPO | Admitting: Physical Therapy

## 2018-03-23 ENCOUNTER — Encounter: Payer: Self-pay | Admitting: Physical Therapy

## 2018-03-23 DIAGNOSIS — M542 Cervicalgia: Secondary | ICD-10-CM

## 2018-03-23 DIAGNOSIS — R293 Abnormal posture: Secondary | ICD-10-CM

## 2018-03-23 NOTE — Therapy (Signed)
[facility] [address] [place], [place], [address] Phone: [phone]   Fax:  [phone]  Physical Therapy Treatment  Patient Details  Name: Jennifer Sims MRN: [id number] Date of Birth: 28-Jul-1966 Referring Provider: Melvenia Beam, MD   Encounter Date: 03/23/2018  PT End of Session - 03/23/18 1409    Visit Number  6    Number of Visits  12    Date for PT Re-Evaluation  04/09/18    PT Start Time  6301    PT Stop Time  1441    PT Time Calculation (min)  38 min    Activity Tolerance  Patient tolerated treatment well;No increased pain    Behavior During Therapy  WFL for tasks assessed/performed       Past Medical History:  Diagnosis Date  . Crohn's disease ([place])   . Migraine   . Osteopenia     Past Surgical History:  Procedure Laterality Date  . CHOLECYSTECTOMY  2009    There were no vitals filed for this visit.  Subjective Assessment - 03/23/18 1407    Subjective  No new compaints. Neck is feeling good today. Has been doing the car exercises, has not been able to do others due to busy schedule after sister's death.     Limitations  Sitting    How long can you sit comfortably?  increased pain with computer    Currently in Pain?  Yes    Pain Score  2     Pain Location  Neck    Pain Orientation  Posterior    Pain Descriptors / Indicators  Aching;Tightness    Pain Type  Chronic pain    Pain Onset  More than a month ago    Pain Frequency  Intermittent    Aggravating Factors   sitting at computer/looking down/driving    Pain Relieving Factors  dry needling, stretching          [facility] Adult PT Treatment/Exercise - 03/23/18 1410      Neck Exercises: Machines for Strengthening   UBE (Upper Arm Bike)  level 2.0 x 2 minutes each forward/backward with cues for posture and decreased speed.      Neck Exercises: Theraband   Scapula Retraction  10 reps;Blue;Limitations    Scapula Retraction  Limitations  cues needed on posture and ex form with 5 second holds    Shoulder Extension  10 reps;Blue;Limitations    Shoulder Extension Limitations  cues on ex form/technique with 5 second holds    Other Theraband Exercises  standing with back againt doorframe wall to promote proper form: with blue band pt performed shoulder horizontal abdcution with 5 sec holds x 10 reps, then alternating diagonal pulls x 10 reps each way.       Shoulder Exercises: ROM/Strengthening   Pushups  10 reps;Limitations    Pushups Limitations  with red pball with cues on posture and ex form    "W" Arms  with back to ball on wall: 10 reps with 5# hand weights. cues on correct form and technique.     X to V Arms  with back to ball against wall: 2# hand weights x 10 reps with cues on correct posture/ex form.     Ball on Wall  with red pball: rolling ball up wall for flexion stretch hold at top, then rolling back down x 10 reps.     Other ROM/Strengthening Exercises  prone on mat: superman, birdman and aquaman x  10 reps each with 2# hand weights. cues on posture and correct ex form.           PT Long Term Goals - 02/26/18 1003      PT LONG TERM GOAL #1   Title  independent with HEP    Status  New    Target Date  04/09/18      PT LONG TERM GOAL #2   Title  verbalize understanding of posture/body mechanics to decrease risk of reinjury    Status  New    Target Date  04/09/18      PT LONG TERM GOAL #3   Title  report 50% improvement in pain and tightness for improved function    Status  New    Target Date  04/09/18            Plan - 03/23/18 1409    Clinical Impression Statement  Today's skilled session continued to focus on strengthening due to pt with decreased pain. Advanced resistance and added new ex's today with pt reporting no pain after session today. Sent blue band home today to advance HEP. Pt is progressing toward goals and should benefit from continued PT to progress toward unmet goals.      PT Treatment/Interventions  ADLs/Self Care Home Management;Cryotherapy;Electrical Stimulation;Ultrasound;Traction;Moist Heat;Functional mobility training;Therapeutic activities;Therapeutic exercise;Patient/family education;Neuromuscular re-education;Passive range of motion;Dry needling;Manual techniques;Taping    PT Next Visit Plan  continue to work on strengthening/posture, DN/manual/modalities as needed    Consulted and Agree with Plan of Care  Patient       Patient will benefit from skilled therapeutic intervention in order to improve the following deficits and impairments:  Pain, Postural dysfunction, Increased muscle spasms, Increased fascial restricitons, Decreased range of motion  Visit Diagnosis: Abnormal posture  Cervicalgia     Problem List Patient Active Problem List   Diagnosis Date Noted  . 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 Excessive daytime sleepiness 03/13/2018  . Snoring 03/13/2018  . Chronic migraine without aura, with intractable migraine, so stated, with status migrainosus 08/03/2016    Willow Ora, PTA, [facility] Neuro [facility] [address], [place], [place] [address] [phone] 03/23/18, 3:31 PM   Name: Jennifer Sims MRN: [id number] Date of Birth: 07-Sep-1966

## 2018-03-26 ENCOUNTER — Encounter: Payer: Self-pay | Admitting: Physical Therapy

## 2018-03-26 ENCOUNTER — Ambulatory Visit: Payer: BC Managed Care – PPO | Attending: Neurology | Admitting: Physical Therapy

## 2018-03-26 DIAGNOSIS — R293 Abnormal posture: Secondary | ICD-10-CM | POA: Diagnosis present

## 2018-03-26 DIAGNOSIS — M542 Cervicalgia: Secondary | ICD-10-CM | POA: Insufficient documentation

## 2018-03-26 DIAGNOSIS — R252 Cramp and spasm: Secondary | ICD-10-CM | POA: Insufficient documentation

## 2018-03-27 NOTE — Therapy (Signed)
[facility] [address] [place], [place], [address] Phone: [phone]   Fax:  [phone]  Physical Therapy Treatment  Patient Details  Name: Jennifer Sims MRN: [id number] Date of Birth: Aug 29, 1966 Referring Provider: Anson Fret, MD   Encounter Date: 03/26/2018  PT End of Session - 03/26/18 1410    Visit Number  7    Number of Visits  12    Date for PT Re-Evaluation  04/09/18    PT Start Time  1408 pt late for appt today    PT Stop Time  1448    PT Time Calculation (min)  40 min    Activity Tolerance  Patient tolerated treatment well;No increased pain    Behavior During Therapy  WFL for tasks assessed/performed       Past Medical History:  Diagnosis Date  . Crohn's disease ([facility])   . Migraine   . Osteopenia     Past Surgical History:  Procedure Laterality Date  . CHOLECYSTECTOMY  2009    There were no vitals filed for this visit.  Subjective Assessment - 03/26/18 1410    Subjective  No new complaints. Reports no pain today. Does still feel she has trigger points that need the dry needling at [facility] street later this week.     Limitations  Sitting    How long can you sit comfortably?  increased pain with computer    Patient Stated Goals  improve posture    Currently in Pain?  No/denies    Pain Score  0-No pain         [facility] Adult PT Treatment/Exercise - 03/26/18 1413      Neck Exercises: Machines for Strengthening   UBE (Upper Arm Bike)  level 2.5 x 2 minutes each forward/backward with cues for posture and decreased speed.      Neck Exercises: Theraband   Scapula Retraction  10 reps;Blue;Limitations    Scapula Retraction Limitations  pt seated on blue pball: cues needed on posture and ex form with 5 second holds    Shoulder Extension  10 reps;Blue;Limitations    Shoulder Extension Limitations  pt seated on blue pball: cues on ex form/technique with 5 second holds    Other Theraband  Exercises  standing with back againt doorframe wall to promote proper form: with blue band pt performed shoulder horizontal abdcution with 5 sec holds x 10 reps, then alternating diagonal pulls x 10 reps each way.       Shoulder Exercises: ROM/Strengthening   Pushups  10 reps;Limitations    Pushups Limitations  with red pball with cues on posture and ex form    "W" Arms  with back to ball on wall: 15 reps with 5# hand weights. cues on correct form and technique.     X to V Arms  with back to ball against wall: 3# hand weights x 10 reps with cues on correct posture/ex form.     Ball on Wall  with red pball: rolling ball up wall for flexion stretch hold at top, then rolling back down x 10 reps.       Manual Therapy   Manual Therapy  Soft tissue mobilization;Manual Traction    Manual therapy comments  minimal trigger points palpated today. decreased mm tightness noted after manual therapy/passive stretching.     Soft tissue mobilization  bil upper trap and levator, rhomboids and supraspinatus    Manual Traction  gentle cervical distraction for mm stretching, progressing  to gently overpressure at shoulders conurrent with manual traction for increased stretching, one side at a time.            PT Long Term Goals - 02/26/18 1003      PT LONG TERM GOAL #1   Title  independent with HEP    Status  New    Target Date  04/09/18      PT LONG TERM GOAL #2   Title  verbalize understanding of posture/body mechanics to decrease risk of reinjury    Status  New    Target Date  04/09/18      PT LONG TERM GOAL #3   Title  report 50% improvement in pain and tightness for improved function    Status  New    Target Date  04/09/18            Plan - 03/26/18 1411    Clinical Impression Statement  Today's skilled session continued to focus on strengthening with no issues reported. Ended session with manual therapy with stretching to decrease mm tightness. Pt has one more week on her current plan  of care and is scheduled for 2 visits at our [facility] street office for dry needling. Due to our open times not aligning with her availability she does not return to our clinci until 04/24/18, past her certification period. If pt is good for discharge after 2cd dry needling appt she will not need to return to our office on that date. If not, on that date the PT will recertify her plan.                  PT Treatment/Interventions  ADLs/Self Care Home Management;Cryotherapy;Electrical Stimulation;Ultrasound;Traction;Moist Heat;Functional mobility training;Therapeutic activities;Therapeutic exercise;Patient/family education;Neuromuscular re-education;Passive range of motion;Dry needling;Manual techniques;Taping    PT Next Visit Plan  continue to work on strengthening/posture, DN/manual/modalities as needed. see above clinical impression statement regarding appt's and certification vs discharge.     Consulted and Agree with Plan of Care  Patient       Patient will benefit from skilled therapeutic intervention in order to improve the following deficits and impairments:  Pain, Postural dysfunction, Increased muscle spasms, Increased fascial restricitons, Decreased range of motion  Visit Diagnosis: Abnormal posture  Cervicalgia     Problem List Patient Active Problem List   Diagnosis Date Noted  . 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 Excessive daytime sleepiness 03/13/2018  . Snoring 03/13/2018  . Chronic migraine without aura, with intractable migraine, so stated, with status migrainosus 08/03/2016    Sallyanne Kuster, PTA, [facility] Outpatient [facility] [address], [address] [place], [place] [address] [phone] 03/27/18, 10:49 AM   Name: Jennifer Sims MRN: [id number] Date of Birth: [birth year]

## 2018-03-28 ENCOUNTER — Ambulatory Visit: Payer: BC Managed Care – PPO | Admitting: Physical Therapy

## 2018-03-29 ENCOUNTER — Encounter: Payer: Self-pay | Admitting: Physical Therapy

## 2018-03-29 ENCOUNTER — Ambulatory Visit: Payer: BC Managed Care – PPO | Admitting: Physical Therapy

## 2018-03-29 DIAGNOSIS — R293 Abnormal posture: Secondary | ICD-10-CM

## 2018-03-29 DIAGNOSIS — R252 Cramp and spasm: Secondary | ICD-10-CM

## 2018-03-29 DIAGNOSIS — M542 Cervicalgia: Secondary | ICD-10-CM

## 2018-03-29 NOTE — Therapy (Signed)
[facility] Outpatient Rehabilitation [facility] [address] [place], [place], [address] Phone: [phone]   Fax:  [phone]  Physical Therapy Treatment  Patient Details  Name: Jennifer Sims MRN: [id number] Date of Birth: 1966-12-20 Referring Provider: Anson Fret, MD   Encounter Date: 03/29/2018  PT End of Session - 03/29/18 1332    Visit Number  8    Number of Visits  12    Date for PT Re-Evaluation  04/09/18    PT Start Time  1330    PT Stop Time  1425    PT Time Calculation (min)  55 min    Activity Tolerance  Patient tolerated treatment well;No increased pain    Behavior During Therapy  WFL for tasks assessed/performed       Past Medical History:  Diagnosis Date  . Crohn's disease ([facility])   . Migraine   . Osteopenia     Past Surgical History:  Procedure Laterality Date  . CHOLECYSTECTOMY  2009    There were no vitals filed for this visit.  Subjective Assessment - 03/29/18 1334    Subjective  No new complaints  I am feeling tightness in my upper traps.      Limitations  Sitting    Patient Stated Goals  improve posture    Currently in Pain?  No/denies just tightness    Pain Score  0-No pain    Pain Orientation  Posterior    Pain Descriptors / Indicators  Tightness                       [facility] Adult PT Treatment/Exercise - 03/29/18 1332      Self-Care   Self-Care  Other Self-Care Comments    Other Self-Care Comments   priortizing self care and pain management for daily living.  reinforcing posture and concepts      Modalities   Modalities  Moist Heat      Moist Heat Therapy   Number Minutes Moist Heat  15 Minutes    Moist Heat Location  Cervical      Manual Therapy   Manual Therapy  Soft tissue mobilization    Manual therapy comments  skilled palpation with TPDN, trigger point  on left  upper trap and levator    Joint Mobilization  PA joint glides grade 3/4 lateral UPA on left grade 3 C-2 to C-6 bil    Soft tissue mobilization  bil upper trap and levator, rhomboids and supraspinatus IASTYM to bil upper trap and levator    Manual Traction  gentle cervical distraction for mm stretching, progressing to gently overpressure at shoulders conurrent with manual traction for increased stretching, one side at a time.        Trigger Point Dry Needling - 03/29/18 1341    Consent Given?  Yes    Education Handout Provided  Yes verbally    Muscles Treated Upper Body  Upper trapezius;Levator scapulae;Suboccipitals muscle group;Rhomboids bil    Upper Trapezius Response  Twitch reponse elicited;Palpable increased muscle length    SubOccipitals Response  Twitch response elicited;Palpable increased muscle length    Levator Scapulae Response  Twitch response elicited;Palpable increased muscle length    Rhomboids Response  Twitch response elicited;Palpable increased muscle length    Supraspinatus Response  Twitch response elicited;Palpable increased muscle length                PT Long Term Goals - 02/26/18 1003      PT  LONG TERM GOAL #1   Title  independent with HEP    Status  New    Target Date  04/09/18      PT LONG TERM GOAL #2   Title  verbalize understanding of posture/body mechanics to decrease risk of reinjury    Status  New    Target Date  04/09/18      PT LONG TERM GOAL #3   Title  report 50% improvement in pain and tightness for improved function    Status  New    Target Date  04/09/18            Plan - 03/29/18 1437    Clinical Impression Statement  Today's skilled session began with education for pain management and self care activities to minimize migraines. Pt had no pain complaint today but did have left upper trap /levator tightness.  Pt consented to TPDN and was closely monitored throughout session.  Pt will continue for one additional visit for TPDN to decreased muscle tissue tension.  will determine if additional visits necessary after next visit.    Rehab  Potential  Good    PT Frequency  2x / week    PT Duration  6 weeks    PT Treatment/Interventions  ADLs/Self Care Home Management;Cryotherapy;Electrical Stimulation;Ultrasound;Traction;Moist Heat;Functional mobility training;Therapeutic activities;Therapeutic exercise;Patient/family education;Neuromuscular re-education;Passive range of motion;Dry needling;Manual techniques;Taping    PT Next Visit Plan  continue to work on strengthening/posture, DN/manual/modalities as needed. see above clinical impression statement regarding appt's and certification vs discharge.     Consulted and Agree with Plan of Care  Patient       Patient will benefit from skilled therapeutic intervention in order to improve the following deficits and impairments:  Pain, Postural dysfunction, Increased muscle spasms, Increased fascial restricitons, Decreased range of motion  Visit Diagnosis: Abnormal posture  Cervicalgia  Cramp and spasm     Problem List Patient Active Problem List   Diagnosis Date Noted  . 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 Excessive daytime sleepiness 03/13/2018  . Snoring 03/13/2018  . Chronic migraine without aura, with intractable migraine, so stated, with status migrainosus 08/03/2016   Garen Lah, PT Certified Exercise Expert for the Aging Adult  03/29/18 2:41 PM Phone: [phone] Fax: [phone]  [facility] Outpatient Rehabilitation [facility] [address] [place], [place], [address] Phone: [phone]   Fax:  [phone]  Name: Jennifer Sims MRN: [id number] Date of Birth: Mar 07, 1966

## 2018-04-05 ENCOUNTER — Encounter: Payer: Self-pay | Admitting: Physical Therapy

## 2018-04-05 ENCOUNTER — Ambulatory Visit: Payer: BC Managed Care – PPO | Admitting: Physical Therapy

## 2018-04-05 DIAGNOSIS — R293 Abnormal posture: Secondary | ICD-10-CM

## 2018-04-05 DIAGNOSIS — M542 Cervicalgia: Secondary | ICD-10-CM

## 2018-04-05 DIAGNOSIS — R252 Cramp and spasm: Secondary | ICD-10-CM

## 2018-04-05 NOTE — Therapy (Signed)
[place], [place], [address] Phone: [phone]   Fax:  [phone]  Physical Therapy Treatment/Discharge Note  Patient Details  Name: Jennifer Sims MRN: [id number] Date of Birth: 1966-10-22 Referring Provider: Melvenia Beam, MD   Encounter Date: 04/05/2018  PT End of Session - 04/05/18 0750    Visit Number  9    Number of Visits  12    Date for PT Re-Evaluation  04/09/18    PT Start Time  0750    PT Stop Time  0856    PT Time Calculation (min)  66 min    Activity Tolerance  Patient tolerated treatment well;No increased pain    Behavior During Therapy  WFL for tasks assessed/performed       Past Medical History:  Diagnosis Date  . Crohn's disease ([place])   . Migraine   . Osteopenia     Past Surgical History:  Procedure Laterality Date  . CHOLECYSTECTOMY  2009    There were no vitals filed for this visit.  Subjective Assessment - 04/05/18 0857    Subjective  I did well with TPDN.  I feel more loose and can turn my neck easier. I am more aware of my posture    Limitations  Sitting    How long can you sit comfortably?  able to sit for 2 hours and take breaks during the day    Patient Stated Goals  improve posture    Currently in Pain?  No/denies    Pain Location  Neck         [facility] PT Assessment - 04/05/18 0903      AROM   Cervical Flexion  55    Cervical Extension  74    Cervical - Right Side Bend  58    Cervical - Left Side Bend  61    Cervical - Right Rotation  65    Cervical - Left Rotation  70      Strength   Right Shoulder Flexion  5/5    Right Shoulder ABduction  5/5    Right Shoulder Internal Rotation  5/5    Right Shoulder External Rotation  5/5    Left Shoulder Flexion  5/5    Left Shoulder ABduction  5/5    Left Shoulder Internal Rotation  5/5    Left Shoulder External Rotation  5/5    Right Elbow Flexion  5/5    Right Elbow Extension  5/5    Left Elbow  Flexion  5/5    Left Elbow Extension  5/5                   [facility] Adult PT Treatment/Exercise - 04/05/18 0755      Self-Care   Self-Care  Other Self-Care Comments;ADL's;Lifting;Posture    ADL's  educated on ADL';s and pt able to verbalize understanding    Lifting  educated and pt able to verbalize at least 4 strategies and protection of neck and back    Posture  Pt reviewed posture and able to verbalize and demonstrate good posture for various scenarios and how to set up work station    Other Self-Care Comments   review use of exercise/pain management with use of tennis ball and Theracane      Neck Exercises: Standing   Other Standing Exercises  Bil ER with red t band  and neck alignemt 15 x 2      Neck Exercises: Supine  Other Supine Exercise  supine neck rotation with tennis ball place at suboccipital x 15  rotation right and left    Other Supine Exercise  cervical flexion with chin tuck and lift 10 x       Neck Exercises: Prone   Other Prone Exercise  superman with chin tuck 10 x 2 with 3 sec hold  neck /thoracic extension 10 x with 10 sec hold       Shoulder Exercises: ROM/Strengthening   Other ROM/Strengthening Exercises  sidelying with 3 lb weight ER bil x 10 each      Modalities   Modalities  Moist Heat      Moist Heat Therapy   Number Minutes Moist Heat  15 Minutes    Moist Heat Location  Cervical      Manual Therapy   Manual Therapy  Soft tissue mobilization    Manual therapy comments  skilled palpation with TPDN, trigger point  on left  upper trap and levator    Joint Mobilization  PA joint glides grade 3/4 lateral UPA on left grade 3 C-2 to C-6 bil    Soft tissue mobilization  bil upper trap and levator, rhomboids and supraspinatus IASTYM to bil upper trap and levator    Manual Traction  gentle cervical distraction for mm stretching, progressing to gently overpressure at shoulders conurrent with manual traction for increased stretching, one side at a  time.        Trigger Point Dry Needling - 04/05/18 4034    Consent Given?  Yes    Education Handout Provided  Yes verbally    Muscles Treated Upper Body  Upper trapezius;Suboccipitals muscle group;Levator scapulae;Rhomboids;Supraspinatus;Oblique capitus all TPDN bil    Upper Trapezius Response  Twitch reponse elicited;Palpable increased muscle length    Oblique Capitus Response  Twitch response elicited;Palpable increased muscle length    SubOccipitals Response  Twitch response elicited;Palpable increased muscle length    Levator Scapulae Response  Twitch response elicited;Palpable increased muscle length    Rhomboids Response  Palpable increased muscle length    Supraspinatus Response  Palpable increased muscle length           PT Education - 04/05/18 0817    Education provided  Yes    Education Details  Pt HEP reveiwe with basic and advanced exercises, also reviewed posture and body mechancis with demo. and reviewed community wellness options and pain managment strategies    Person(s) Educated  Patient    Methods  Explanation;Demonstration;Tactile cues;Verbal cues;Handout    Comprehension  Verbalized understanding;Returned demonstration          PT Long Term Goals - 04/05/18 0819      PT LONG TERM GOAL #1   Title  independent with HEP    Baseline  Pt reviewed HEP and able to do independently    Time  4    Period  Weeks    Status  Achieved      PT LONG TERM GOAL #2   Title  verbalize understanding of posture/body mechanics to decrease risk of reinjury    Time  4    Period  Weeks    Status  Achieved      PT LONG TERM GOAL #3   Title  report 50% improvement in pain and tightness for improved function    Baseline  Pt has no pain at present and much looser due to TPDN    Time  4    Period  Weeks  Status  Achieved            Plan - 04/05/18 0904    Clinical Impression Statement  Pt responded well to TPDN and pt is now independent with pain management and  HEP. Pt has improved AROM in every neck plane of movement at least 50 degrees or greater.  Pt was closely monitored throughout session and had good improvement with palpable lengthening. [name] also able to demonstrate good posture awareness  and able to set up ergonomic seating with awareness of elevation of shoudler.  Pt also educated on community resources.  Pt is ready for DC  All LTGs achieved     Rehab Potential  Good    PT Frequency  2x / week    PT Duration  6 weeks    PT Treatment/Interventions  ADLs/Self Care Home Management;Cryotherapy;Electrical Stimulation;Ultrasound;Traction;Moist Heat;Functional mobility training;Therapeutic activities;Therapeutic exercise;Patient/family education;Neuromuscular re-education;Passive range of motion;Dry needling;Manual techniques;Taping    PT Next Visit Plan  DC    Consulted and Agree with Plan of Care  Patient       Patient will benefit from skilled therapeutic intervention in order to improve the following deficits and impairments:  Pain, Postural dysfunction, Increased muscle spasms, Increased fascial restricitons, Decreased range of motion  Visit Diagnosis: Abnormal posture  Cervicalgia  Cramp and spasm     Problem List Patient Active Problem List   Diagnosis Date Noted  . 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 Excessive daytime sleepiness 03/13/2018  . Snoring 03/13/2018  . Chronic migraine without aura, with intractable migraine, so stated, with status migrainosus 08/03/2016    Voncille Lo, PT Certified Exercise Expert for the Aging Adult  04/05/18 9:07 AM Phone: [phone] Fax: [facility] [address] [place], [place], [address] Phone: [phone]   Fax:  [phone]  Name: Jennifer Sims MRN: [id number] Date of Birth: [birth year]  PHYSICAL THERAPY DISCHARGE SUMMARY  Visits from Start of Care: 9  Current functional  level related to goals / functional outcomes: As above   Remaining deficits: none   Education / Equipment: HEP Plan: Patient agrees to discharge.  Patient goals were met. Patient is being discharged due to meeting the stated rehab goals.  ?????   And being pleased with current functinal level  Voncille Lo, PT Certified Exercise Expert for the Aging Adult  04/05/18 9:07 AM Phone: [phone] Fax: [phone]

## 2018-04-05 NOTE — Patient Instructions (Addendum)

## 2018-04-24 ENCOUNTER — Ambulatory Visit: Payer: BC Managed Care – PPO

## 2018-04-27 ENCOUNTER — Encounter: Payer: Self-pay | Admitting: Neurology

## 2018-05-02 ENCOUNTER — Telehealth: Payer: Self-pay | Admitting: Neurology

## 2018-05-02 NOTE — Telephone Encounter (Signed)
I called in a refill to [facility]. DW

## 2018-05-02 NOTE — Telephone Encounter (Signed)
Per verbal from Dr. Lucia Gaskins, will speak with pt at her botox visit on 5/15.

## 2018-05-07 ENCOUNTER — Ambulatory Visit (INDEPENDENT_AMBULATORY_CARE_PROVIDER_SITE_OTHER): Payer: BC Managed Care – PPO | Admitting: Neurology

## 2018-05-07 DIAGNOSIS — G4753 Recurrent isolated sleep paralysis: Secondary | ICD-10-CM

## 2018-05-07 DIAGNOSIS — R51 Headache: Secondary | ICD-10-CM

## 2018-05-07 DIAGNOSIS — R0683 Snoring: Secondary | ICD-10-CM

## 2018-05-07 DIAGNOSIS — G4719 Other hypersomnia: Secondary | ICD-10-CM

## 2018-05-07 DIAGNOSIS — R519 Headache, unspecified: Secondary | ICD-10-CM

## 2018-05-08 ENCOUNTER — Other Ambulatory Visit: Payer: Self-pay | Admitting: Neurology

## 2018-05-08 ENCOUNTER — Encounter: Payer: Self-pay | Admitting: Neurology

## 2018-05-08 DIAGNOSIS — G4719 Other hypersomnia: Secondary | ICD-10-CM

## 2018-05-08 DIAGNOSIS — R0683 Snoring: Secondary | ICD-10-CM

## 2018-05-08 DIAGNOSIS — R51 Headache: Secondary | ICD-10-CM

## 2018-05-08 DIAGNOSIS — G4753 Recurrent isolated sleep paralysis: Secondary | ICD-10-CM | POA: Diagnosis not present

## 2018-05-08 DIAGNOSIS — R519 Headache, unspecified: Secondary | ICD-10-CM

## 2018-05-08 DIAGNOSIS — Z79899 Other long term (current) drug therapy: Secondary | ICD-10-CM

## 2018-05-08 NOTE — Addendum Note (Signed)
Addended by: Lenor Coffin C on: 05/08/2018 01:46 PM   Modules accepted: Orders

## 2018-05-09 ENCOUNTER — Ambulatory Visit: Payer: BC Managed Care – PPO | Admitting: Neurology

## 2018-05-09 VITALS — BP 124/93 | HR 91

## 2018-05-09 DIAGNOSIS — G43709 Chronic migraine without aura, not intractable, without status migrainosus: Secondary | ICD-10-CM | POA: Diagnosis not present

## 2018-05-09 NOTE — Progress Notes (Signed)
Botox- 100 units x 2 vials Lot: [id number] Expiration: 09/2020 NDC: 7072-1711-65  Bacteriostatic 0.9% Sodium Chloride- 52m total Lot: [id number]: 04/26/2019 NDC: [address]-21 Dx: [id number]/P  //BCrn

## 2018-05-09 NOTE — Progress Notes (Signed)
Interval history: Not ione headache since last botox, remarkable improvement on botox. +temples and eyes. No masseters. +traps and levators. She loved the dry needling, [place] on [address].   Interval history 02/06/2018: She snore heavily, very tired during the day, morning headaches intractable. Will send to sleep team for evaluation of obstructive sleep apnea.  She feels significantly improved on the botox. She only takes resue meds once a month, down to one migraine day a month on average. We just started Ajovy at last appointment, has had 2 injections so far. Propranolol as needed. Reglan not so great. She is on Topiramate 69m at bedtime. She wakes up every morning with headache and nausea. 3-4 mornings a week she wakes with the headache, dry mouth, loud snoring per husband, resolves later in the day. She would like a sleep evaluation. She also has lots of neck muscle tightness and pain. Discussed PT and dry needling, posture. Can try Indomethacin for neck pain, ibuprofen in the past has caused headaches will try something different.   - For OSA: refer to sleep eval - For myofascial neck pain: Physical Therapy: Cervical myofascial pain, forward posture contributing to migraines and cervicalgia. Please evaluate and treat including dry needling, stretching, strengthening, manual therapy/massage, heating, TENS unit, exercising for scapular stabilization, pectoral stretching and rhomboid strengthening as clinically warranted as well as any other modality as recommended by evaluation. - For arthritis: Indomethacin, prescribed today  No orders of the defined types were placed in this encounter.  A total of 25 minutes was spent in with this patient face to face. Over half this time was spent on counseling patient on the OSA, neck pain, arthritis diagnosis and different therapeutic options available. This does not include time spent performing botox injections or anything botox/migraine related.     Consent Form Botulism Toxin Injection For Chronic Migraine  Botulism toxin has been approved by the Federal drug administration for treatment of chronic migraine. Botulism toxin does not cure chronic migraine and it may not be effective in some patients.  The administration of botulism toxin is accomplished by injecting a small amount of toxin into the muscles of the neck and head. Dosage must be titrated for each individual. Any benefits resulting from botulism toxin tend to wear off after 3 months with a repeat injection required if benefit is to be maintained. Injections are usually done every 3-4 months with maximum effect peak achieved by about 2 or 3 weeks. Botulism toxin is expensive and you should be sure of what costs you will incur resulting from the injection.  The side effects of botulism toxin use for chronic migraine may include:   -Transient, and usually mild, facial weakness with facial injections  -Transient, and usually mild, head or neck weakness with head/neck injections  -Reduction or loss of forehead facial animation due to forehead muscle              weakness  -Eyelid drooping  -Dry eye  -Pain at the site of injection or bruising at the site of injection  -Double vision  -Potential unknown long term risks  Contraindications: You should not have Botox if you are pregnant, nursing, allergic to albumin, have an infection, skin condition, or muscle weakness at the site of the injection, or have myasthenia gravis, Lambert-Eaton syndrome, or ALS.  It is also possible that as with any injection, there may be an allergic reaction or no effect from the medication. Reduced effectiveness after repeated injections is sometimes seen and  rarely infection at the injection site may occur. All care will be taken to prevent these side effects. If therapy is given over a long time, atrophy and wasting in the muscle injected may occur. Occasionally the patient's become refractory to  treatment because they develop antibodies to the toxin. In this event, therapy needs to be modified.  I have read the above information and consent to the administration of botulism toxin.    ______________  _____   _________________  Patient signature     Date   Witness signature       BOTOX PROCEDURE NOTE FOR MIGRAINE HEADACHE    Contraindications and precautions discussed with patient(above). Aseptic procedure was observed and patient tolerated procedure. Procedure performed by Dr. Georgia Dom  The condition has existed for more than 6 months, and pt does not have a diagnosis of ALS, Myasthenia Gravis or Lambert-Eaton Syndrome. Risks and benefits of injections discussed and pt agrees to proceed with the procedure. Written consent obtained  These injections are medically necessary. He receives good benefits from these injections. These injections do not cause sedations or hallucinations which the oral therapies may cause.  Indication/Diagnosis: chronic migraine BOTOX(J0585) injection was performed according to protocol by [employer]. 200 units of BOTOX was dissolved into 4 cc NS.  NDC: [address]-74  Type of toxin: Botox   Description of procedure:  The patient was placed in a sitting position. The standard protocol was used for Botox as follows, with 5 units of Botox injected at each site:   -Procerus muscle, midline injection  -Corrugator muscle, bilateral injection  -Frontalis muscle, bilateral injection, with 2 sites each side, medial injection was performed in the upper one third of the frontalis muscle, in the region vertical from the medial inferior edge of the superior orbital rim. The lateral injection was again in the upper one third of the forehead vertically above the lateral limbus of the cornea, 1.5 cm lateral to the medial injection site.  -Temporalis muscle injection, 4 sites, bilaterally. The first injection was 3 cm above the tragus of the ear, second  injection site was 1.5 cm to 3 cm up from the first injection site in line with the tragus of the ear. The third injection site was 1.5-3 cm forward between the first 2 injection sites. The fourth injection site was 1.5 cm posterior to the second injection site.  -Occipitalis muscle injection, 3 sites, bilaterally. The first injection was done one half way between the occipital protuberance and the tip of the mastoid process behind the ear. The second injection site was done lateral and superior to the first, 1 fingerbreadth from the first injection. The third injection site was 1 fingerbreadth superiorly and medially from the first injection site.  -Cervical paraspinal muscle injection, 2 sites, bilateral knee first injection site was 1 cm from the midline of the cervical spine, 3 cm inferior to the lower border of the occipital protuberance. The second injection site was 1.5 cm superiorly and laterally to the first injection site.  -Trapezius muscle injection was performed at 3 sites, bilaterally. The first injection site was in the upper trapezius muscle halfway between the inflection point of the neck, and the acromion. The second injection site was one half way between the acromion and the first injection site. The third injection was done between the first injection site and the inflection point of the neck.   Will return for repeat injection in 3 months.   A 200 unit sof Botox was used,  155 units were injected, the rest of the Botox was wasted. The patient tolerated the procedure well, there were no complications of the above procedure.

## 2018-05-11 ENCOUNTER — Other Ambulatory Visit: Payer: Self-pay | Admitting: Neurology

## 2018-05-11 ENCOUNTER — Telehealth: Payer: Self-pay | Admitting: Neurology

## 2018-05-11 NOTE — Procedures (Signed)
PATIENT'S NAME:  Jennifer Sims, Jennifer Sims DOB:      March 25, 1966      MR#:    [id number]     DATE OF RECORDING: 05/07/2018 REFERRING M.D.:  Naomie Dean MD Study Performed:   Baseline Polysomnogram for MSLT to follow.   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 Her morning headaches are frequently accompanied by nausea.  In addition, she endorses a very high degree of sleepiness of 23 / 24 points on the Epworth Sleepiness Score but only 31 points on the fatigue severity score.  She has lots of not neck muscle tightness and cervicalgia and Dr. Lucia Gaskins has recommended PT, dry needling, improving posture and has applied Botox injections.  Her last prescribed medication was Indomethacin.  The patient endorsed the Epworth Sleepiness Scale at 23 points.   The patient's weight 134 pounds with a height of 62 (inches), resulting in a BMI of 24.7 kg/m2. The patient's neck circumference measured 12 inches.  CURRENT MEDICATIONS: Diclofenac, Colace, Allegra, Ajovy, Indocin, Reglan, Compazine, Inderal, Topamax   PROCEDURE:  This is a multichannel digital polysomnogram utilizing the Somnostar 11.2 system.  Electrodes and sensors were applied and monitored per AASM Specifications.   EEG, EOG, Chin and Limb EMG, were sampled at 200 Hz.  ECG, Snore and Nasal Pressure, Thermal Airflow, Respiratory Effort, CPAP Flow and Pressure, Oximetry was sampled at 50 Hz. Digital video and audio were recorded.      BASELINE STUDY; Lights Out was at 21:20 and Lights On at 06:01.  Total recording time (TRT) was 521.5 minutes, with a total sleep time (TST) of 412 minutes.   The patient's sleep latency was 27 minutes.  REM latency was 58.5 minutes.  The sleep efficiency was 79.1 %.     SLEEP ARCHITECTURE: WASO (Wake after sleep onset) was 83.5 minutes.  There were 37.5 minutes in Stage  N1, 168 minutes Stage N2, 138.5 minutes Stage N3 and 68 minutes in Stage REM.  The percentage of Stage N1 was 9.1%, Stage N2 was 40.8%, Stage N3 was 33.6% and Stage R (REM sleep) was 16.5%.   RESPIRATORY ANALYSIS:  There were a total of 17 respiratory events:  0 apneas and 17 hypopneas with 1 additional respiratory event related arousal (Snore arousal/ RERA). The total APNEA/HYPOPNEA INDEX (AHI) was 2.5/hour and the total RESPIRATORY DISTURBANCE INDEX was 2.6 /hour.  14 events occurred in REM sleep and 6 events in NREM. The REM AHI was 12.4 /hour, versus a non-REM AHI of 0.5. The patient spent 412 minutes (the total sleep time) in the supine position. The supine AHI was 2.5 versus a non-supine AHI of 0.0/h.  OXYGEN SATURATION & C02:  The Wake baseline 02 saturation was 99%, with the lowest being 86%. Time spent below 89% saturation equaled 0 minutes.    PERIODIC LIMB MOVEMENTS:  The patient had a total of 0 Periodic Limb Movements.   The arousals were noted as: 45 were spontaneous, 0 were associated with PLMs, and 4 were associated with respiratory events.  Audio and video analysis did not show any abnormal or unusual movements, behaviors, phonations or vocalizations.   The patient took one bathroom break. Soft to moderate Snoring was noted. EKG was in keeping with normal sinus rhythm (NSR).   IMPRESSION: no physiological cause /explanation for the reported excessive daytime sleepiness was found.  1. Clinically insignificant  degree of REM sleep dependent Obstructive Sleep Apnea(OSA) 2. No evidence of Periodic Limb Movement Disorder (PLMD), hypoxemia, irregular heart rate.  3. Mild and Moderate Snoring, non - continuous.  RECOMMENDATIONS:  1. Valid study for MSLT to follow. Post-study, the patient indicated that sleep in the sleep laboratory environment was worse than usual.    I certify that I have reviewed the entire raw data recording prior to the issuance of this report in accordance  with the Standards of Accreditation of the American Academy of Sleep Medicine (AASM)   Melvyn Novas, MD    05-11-2018  Diplomat, American Board of Psychiatry and Neurology  Diplomat, American Board of Sleep Medicine Medical Director, [facility] at [facility]

## 2018-05-11 NOTE — Telephone Encounter (Signed)
Called and went over both sleep studies with the pt. Pt showed no signs of sleep apnea or restlessness in her sleep. Her daytime study was also normal. I reviewed the information on the test with her and explained to her that the test did give any sleep reasoning as to what could cause her daytime sleepiness. Pt verbalized understanding. Pt had no questions at this time but was encouraged to call back if questions arise. Offered the pt a follow up apt to discuss further and at this time she states that she doesn't feel it is necessary.

## 2018-05-11 NOTE — Telephone Encounter (Signed)
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

## 2018-05-11 NOTE — Procedures (Unsigned)
  Name:  Jennifer Sims, Jennifer Sims Reference [id number]  Study Date: 05/08/2018 Procedure #: [id number]  DOB: 07-26-1966    Protocol  This is a 13 channel Multiple Sleep Latency Test comprised of 5 channels of EEG (T3-Cz, Cz-T4, F4-M1, C4-M1, O2-M1), 3 channels of Chin EMG, 4 channels of EOG and 1 channel for ECG.   All channels were sampled at 256hz .    This polysomnographic procedure is designed to evaluate (1) the complaint of excessive daytime sleepiness by quantifying the time required to fall asleep and (2) the possibility of narcolepsy by checking for abnormally short latencies to REM sleep.  Electrographic variables include EEG, EMG, EOG and ECG.  Patients are monitored throughout four or five 20-minute opportunities to sleep (naps) at two-hour intervals.  For each nap, the patient is allowed 20 minutes to fall asleep.  Once asleep, the patient is awakened after 15 minutes.  Between naps, the patient is kept as alert as possible.  A sleep latency of 20 minutes indicates that no sleep occurred.  Parametric Analysis  Total Number of Naps 4     NAP # Time of Nap  Sleep Latency (mins) REM Latency (mins) Sleep Time Percent Awake Time Percent  1 07:30 20 0 0 100   2 09:21 7.5 0 60  40   3 11:16 20 0 0  100   4 13:34 20 0 0  100   5 --- 0 0 ![place]0  0   MSLT Summary of Naps  Sleepiness Index: 15.6  Mean Sleep Latency to all Five Naps: 0  Mean Sleep Latency to First Four Naps: 16.9  Mean Sleep Latency to First Three Naps: 15.8  Mean Sleep Latency to First Two Naps: 13.8  Number of Naps with REM Sleep: 0    Results from Preceding PSG Study  Sleep Onset Time 21:46 Sleep Efficiency (%) 79%  Rise Time 06:01 Sleep Latency (min) 26  Total Sleep Time  6.9hrs REM Latency (min) 58.5          I attest to having reviewed every epoch of the entire raw data recording prior to the issuance of this report in accordance with the Standards of the American Academy of Sleep Medicine.     IMPRESSION:  1. This multiple sleep latency test reveals a mean sleep latency of 16.9 minutes with 0 sleep periods during which REM sleep was recorded.  A total of 4 sleep periods were recorded.   2. This study was preceded by an overnight polysomnogram with a total sleep time (TST) of 412 minutes.       Name:  Jennifer Sims, Jennifer Sims Reference #:  [id number]  Study Date: 05/08/2018 DOB: Feb 13, 1966    RECOMMENDATIONS: This is a normal MSLT study. The patient remained awake for 3 out of 4 naps.  There is no indication of excessive daytime sleepiness/ hypersomnolence.  Melvyn Novas, M.D.   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

## 2018-05-11 NOTE — Telephone Encounter (Signed)
-----   Message from Larey Seat, MD sent at 05/11/2018 11:25 AM EDT ----- Normal MSLT

## 2018-05-14 LAB — COMPREHENSIVE DRUG ANALYSIS,UR

## 2018-06-13 NOTE — Telephone Encounter (Signed)
Patient called and requested phone number for [facility]. I gave her the number [phone] and instructed her to call two weeks before.

## 2018-08-02 NOTE — Telephone Encounter (Signed)
I called to check status of the patients medication, it was pending insurance and they expedited the request. DW

## 2018-08-14 ENCOUNTER — Ambulatory Visit: Payer: BC Managed Care – PPO | Admitting: Neurology

## 2018-08-14 DIAGNOSIS — G43709 Chronic migraine without aura, not intractable, without status migrainosus: Secondary | ICD-10-CM | POA: Diagnosis not present

## 2018-08-14 MED ORDER — GALCANEZUMAB-GNLM 120 MG/ML ~~LOC~~ SOAJ: 120.0000 mg | SUBCUTANEOUS | 11 refills | Status: DC

## 2018-08-14 NOTE — Progress Notes (Signed)
Interval history: Not ione headache since last botox, remarkable improvement on botox. +temples and eyes. No masseters. +traps and levators. She loved the dry needling, [place] on [address].   Interval history 02/06/2018: She snore heavily, very tired during the day, morning headaches intractable. Will send to sleep team for evaluation of obstructive sleep apnea.  She feels significantly improved on the botox. She only takes resue meds once a month, down to one migraine day a month on average. We just started Ajovy at last appointment, has had 2 injections so far. Propranolol as needed. Reglan not so great. She is on Topiramate 66m at bedtime. She wakes up every morning with headache and nausea. 3-4 mornings a week she wakes with the headache, dry mouth, loud snoring per husband, resolves later in the day. She would like a sleep evaluation. She also has lots of neck muscle tightness and pain. Discussed PT and dry needling, posture. Can try Indomethacin for neck pain, ibuprofen in the past has caused headaches will try something different.   - For OSA: refer to sleep eval - For myofascial neck pain: Physical Therapy: Cervical myofascial pain, forward posture contributing to migraines and cervicalgia. Please evaluate and treat including dry needling, stretching, strengthening, manual therapy/massage, heating, TENS unit, exercising for scapular stabilization, pectoral stretching and rhomboid strengthening as clinically warranted as well as any other modality as recommended by evaluation. - For arthritis: Indomethacin, prescribed today  No orders of the defined types were placed in this encounter.  A total of 25 minutes was spent in with this patient face to face. Over half this time was spent on counseling patient on the OSA, neck pain, arthritis diagnosis and different therapeutic options available. This does not include time spent performing botox injections or anything botox/migraine related.     Consent Form Botulism Toxin Injection For Chronic Migraine  Botulism toxin has been approved by the Federal drug administration for treatment of chronic migraine. Botulism toxin does not cure chronic migraine and it may not be effective in some patients.  The administration of botulism toxin is accomplished by injecting a small amount of toxin into the muscles of the neck and head. Dosage must be titrated for each individual. Any benefits resulting from botulism toxin tend to wear off after 3 months with a repeat injection required if benefit is to be maintained. Injections are usually done every 3-4 months with maximum effect peak achieved by about 2 or 3 weeks. Botulism toxin is expensive and you should be sure of what costs you will incur resulting from the injection.  The side effects of botulism toxin use for chronic migraine may include:   -Transient, and usually mild, facial weakness with facial injections  -Transient, and usually mild, head or neck weakness with head/neck injections  -Reduction or loss of forehead facial animation due to forehead muscle              weakness  -Eyelid drooping  -Dry eye  -Pain at the site of injection or bruising at the site of injection  -Double vision  -Potential unknown long term risks  Contraindications: You should not have Botox if you are pregnant, nursing, allergic to albumin, have an infection, skin condition, or muscle weakness at the site of the injection, or have myasthenia gravis, Lambert-Eaton syndrome, or ALS.  It is also possible that as with any injection, there may be an allergic reaction or no effect from the medication. Reduced effectiveness after repeated injections is sometimes seen and  rarely infection at the injection site may occur. All care will be taken to prevent these side effects. If therapy is given over a long time, atrophy and wasting in the muscle injected may occur. Occasionally the patient's become refractory to  treatment because they develop antibodies to the toxin. In this event, therapy needs to be modified.  I have read the above information and consent to the administration of botulism toxin.    ______________  _____   _________________  Patient signature     Date   Witness signature       BOTOX PROCEDURE NOTE FOR MIGRAINE HEADACHE    Contraindications and precautions discussed with patient(above). Aseptic procedure was observed and patient tolerated procedure. Procedure performed by Dr. Georgia Dom  The condition has existed for more than 6 months, and pt does not have a diagnosis of ALS, Myasthenia Gravis or Lambert-Eaton Syndrome. Risks and benefits of injections discussed and pt agrees to proceed with the procedure. Written consent obtained  These injections are medically necessary. He receives good benefits from these injections. These injections do not cause sedations or hallucinations which the oral therapies may cause.  Indication/Diagnosis: chronic migraine BOTOX(J0585) injection was performed according to protocol by [employer]. 200 units of BOTOX was dissolved into 4 cc NS.  NDC: [address]-64  Type of toxin: Botox   Description of procedure:  The patient was placed in a sitting position. The standard protocol was used for Botox as follows, with 5 units of Botox injected at each site:   -Procerus muscle, midline injection  -Corrugator muscle, bilateral injection  -Frontalis muscle, bilateral injection, with 2 sites each side, medial injection was performed in the upper one third of the frontalis muscle, in the region vertical from the medial inferior edge of the superior orbital rim. The lateral injection was again in the upper one third of the forehead vertically above the lateral limbus of the cornea, 1.5 cm lateral to the medial injection site.  -Temporalis muscle injection, 4 sites, bilaterally. The first injection was 3 cm above the tragus of the ear, second  injection site was 1.5 cm to 3 cm up from the first injection site in line with the tragus of the ear. The third injection site was 1.5-3 cm forward between the first 2 injection sites. The fourth injection site was 1.5 cm posterior to the second injection site.  -Occipitalis muscle injection, 3 sites, bilaterally. The first injection was done one half way between the occipital protuberance and the tip of the mastoid process behind the ear. The second injection site was done lateral and superior to the first, 1 fingerbreadth from the first injection. The third injection site was 1 fingerbreadth superiorly and medially from the first injection site.  -Cervical paraspinal muscle injection, 2 sites, bilateral knee first injection site was 1 cm from the midline of the cervical spine, 3 cm inferior to the lower border of the occipital protuberance. The second injection site was 1.5 cm superiorly and laterally to the first injection site.  -Trapezius muscle injection was performed at 3 sites, bilaterally. The first injection site was in the upper trapezius muscle halfway between the inflection point of the neck, and the acromion. The second injection site was one half way between the acromion and the first injection site. The third injection was done between the first injection site and the inflection point of the neck.   Will return for repeat injection in 3 months.   A 200 unit sof Botox was used,  155 units were injected, the rest of the Botox was wasted. The patient tolerated the procedure well, there were no complications of the above procedure.

## 2018-08-14 NOTE — Progress Notes (Signed)
Botox- 100 units x 2 vials Lot: [id number] Expiration: 03/22 NDC: 4540-9811-91  Bacteriostatic 0.9% Sodium Chloride- 4mL total Lot: [id number] Expiration: 09/26/2019 NDC: 6213-0865-78  Dx: I69.629 S/P

## 2018-11-07 ENCOUNTER — Telehealth: Payer: Self-pay | Admitting: Neurology

## 2018-11-07 NOTE — Telephone Encounter (Signed)
I called the patient to make her aware that it is pending patient consent but she did not answer. I left her a VM asking her to call back. If she calls back please give her the number below.

## 2018-11-07 NOTE — Telephone Encounter (Signed)
I called [facility] and requested a refill [phone]. DW

## 2018-11-12 NOTE — Telephone Encounter (Signed)
Pt has called to inform Jennifer Sims that she has called [facility] and given her consent.  Pt states [name] needs to call them so her delivery will be here by tomorrow

## 2018-11-12 NOTE — Telephone Encounter (Signed)
I called to scheduled delivery but patient has still not given consent. I called the patient again but she did not answer so I left a VM asking her to call me back.

## 2018-11-12 NOTE — Telephone Encounter (Signed)
Pt has returned the call to [facility], she is asking for a call back either before 12 noon or after 1pm(pt has a class from 12-12:50)

## 2018-11-13 ENCOUNTER — Ambulatory Visit: Payer: BC Managed Care – PPO | Admitting: Neurology

## 2018-11-13 DIAGNOSIS — S161XXA Strain of muscle, fascia and tendon at neck level, initial encounter: Secondary | ICD-10-CM

## 2018-11-13 DIAGNOSIS — G43711 Chronic migraine without aura, intractable, with status migrainosus: Secondary | ICD-10-CM | POA: Diagnosis not present

## 2018-11-13 NOTE — Progress Notes (Addendum)
Interval history 11/13/2018: Not one headache since last botox, remarkable improvement on botox. +temples and oo. No masseters. +traps and +levators. She loved the dry needling, [place] on [address]. She had rebound from imitrex, ibuprofen and other meds. She is on Emgality and botox. She has not had to use any acute management meds. She had an allergic reaction to ajovy. Patient's neck has worsened, acute cervical strain on chronic myofascial cervical pain. She did great with dry needling will refer again.  No orders of the defined types were placed in this encounter.    Interval history 02/06/2018: She snore heavily, very tired during the day, morning headaches intractable. Will send to sleep team for evaluation of obstructive sleep apnea.  She feels significantly improved on the botox. She only takes resue meds once a month, down to one migraine day a month on average. We just started Ajovy at last appointment, has had 2 injections so far. Propranolol as needed. Reglan not so great. She is on Topiramate 73m at bedtime. She wakes up every morning with headache and nausea. 3-4 mornings a week she wakes with the headache, dry mouth, loud snoring per husband, resolves later in the day. She would like a sleep evaluation. She also has lots of neck muscle tightness and pain. Discussed PT and dry needling, posture. Can try Indomethacin for neck pain, ibuprofen in the past has caused headaches will try something different.   - For OSA: refer to sleep eval - For myofascial neck pain: Physical Therapy: Cervical myofascial pain, forward posture contributing to migraines and cervicalgia. Please evaluate and treat including dry needling, stretching, strengthening, manual therapy/massage, heating, TENS unit, exercising for scapular stabilization, pectoral stretching and rhomboid strengthening as clinically warranted as well as any other modality as recommended by evaluation. - For arthritis: Indomethacin, prescribed  today    Consent Form Botulism Toxin Injection For Chronic Migraine    Reviewed orally with patient, additionally signature is on file:  Botulism toxin has been approved by the Federal drug administration for treatment of chronic migraine. Botulism toxin does not cure chronic migraine and it may not be effective in some patients.  The administration of botulism toxin is accomplished by injecting a small amount of toxin into the muscles of the neck and head. Dosage must be titrated for each individual. Any benefits resulting from botulism toxin tend to wear off after 3 months with a repeat injection required if benefit is to be maintained. Injections are usually done every 3-4 months with maximum effect peak achieved by about 2 or 3 weeks. Botulism toxin is expensive and you should be sure of what costs you will incur resulting from the injection.  The side effects of botulism toxin use for chronic migraine may include:   -Transient, and usually mild, facial weakness with facial injections  -Transient, and usually mild, head or neck weakness with head/neck injections  -Reduction or loss of forehead facial animation due to forehead muscle weakness  -Eyelid drooping  -Dry eye  -Pain at the site of injection or bruising at the site of injection  -Double vision  -Potential unknown long term risks  Contraindications: You should not have Botox if you are pregnant, nursing, allergic to albumin, have an infection, skin condition, or muscle weakness at the site of the injection, or have myasthenia gravis, Lambert-Eaton syndrome, or ALS.  It is also possible that as with any injection, there may be an allergic reaction or no effect from the medication. Reduced effectiveness after repeated injections  is sometimes seen and rarely infection at the injection site may occur. All care will be taken to prevent these side effects. If therapy is given over a long time, atrophy and wasting in the muscle  injected may occur. Occasionally the patient's become refractory to treatment because they develop antibodies to the toxin. In this event, therapy needs to be modified.  I have read the above information and consent to the administration of botulism toxin.    BOTOX PROCEDURE NOTE FOR MIGRAINE HEADACHE    Contraindications and precautions discussed with patient(above). Aseptic procedure was observed and patient tolerated procedure. Procedure performed by Dr. Georgia Dom  The condition has existed for more than 6 months, and pt does not have a diagnosis of ALS, Myasthenia Gravis or Lambert-Eaton Syndrome.  Risks and benefits of injections discussed and pt agrees to proceed with the procedure.  Written consent obtained  These injections are medically necessary. Pt  receives good benefits from these injections. These injections do not cause sedations or hallucinations which the oral therapies may cause.  Description of procedure:  The patient was placed in a sitting position. The standard protocol was used for Botox as follows, with 5 units of Botox injected at each site:   -Procerus muscle, midline injection  -Corrugator muscle, bilateral injection  -Frontalis muscle, bilateral injection, with 2 sites each side, medial injection was performed in the upper one third of the frontalis muscle, in the region vertical from the medial inferior edge of the superior orbital rim. The lateral injection was again in the upper one third of the forehead vertically above the lateral limbus of the cornea, 1.5 cm lateral to the medial injection site.  - Levator Scapulae: 5 units bilaterally  -Temporalis muscle injection, 5 sites, bilaterally. The first injection was 3 cm above the tragus of the ear, second injection site was 1.5 cm to 3 cm up from the first injection site in line with the tragus of the ear. The third injection site was 1.5-3 cm forward between the first 2 injection sites. The fourth  injection site was 1.5 cm posterior to the second injection site. 5th site laterally in the temporalis  muscleat the level of the outer canthus.  - Patient feels the migraines are centered around the eyes +5 units bilaterally at the outer canthus in the orbicularis occuli  -Occipitalis muscle injection, 3 sites, bilaterally. The first injection was done one half way between the occipital protuberance and the tip of the mastoid process behind the ear. The second injection site was done lateral and superior to the first, 1 fingerbreadth from the first injection. The third injection site was 1 fingerbreadth superiorly and medially from the first injection site.  -Cervical paraspinal muscle injection, 2 sites, bilateral knee first injection site was 1 cm from the midline of the cervical spine, 3 cm inferior to the lower border of the occipital protuberance. The second injection site was 1.5 cm superiorly and laterally to the first injection site.  -Trapezius muscle injection was performed at 3 sites, bilaterally. The first injection site was in the upper trapezius muscle halfway between the inflection point of the neck, and the acromion. The second injection site was one half way between the acromion and the first injection site. The third injection was done between the first injection site and the inflection point of the neck.   Will return for repeat injection in 3 months.   A 200 unit sof Botox was used, any Botox not injected was wasted.  The patient tolerated the procedure well, there were no complications of the above procedure.

## 2018-11-13 NOTE — Addendum Note (Signed)
Addended by: Sarina Ill B on: 11/13/2018 07:14 PM   Modules accepted: Orders

## 2018-11-13 NOTE — Telephone Encounter (Signed)
I had already spoken to the pharmacy when patient called.

## 2018-11-13 NOTE — Progress Notes (Signed)
Botox- 100 units x 2 vials Lot: [id number] Expiration: 04/2021 NDC: 9741-6384-53  Bacteriostatic 0.9% Sodium Chloride- 18mL total Lot: [id number] Expiration: 09/26/2019 NDC: 2122-4825-00  Dx: B70.488 S/P

## 2018-12-04 ENCOUNTER — Ambulatory Visit: Payer: BC Managed Care – PPO | Attending: Neurology | Admitting: Physical Therapy

## 2018-12-04 ENCOUNTER — Other Ambulatory Visit: Payer: Self-pay

## 2018-12-04 ENCOUNTER — Encounter: Payer: Self-pay | Admitting: Physical Therapy

## 2018-12-04 DIAGNOSIS — R252 Cramp and spasm: Secondary | ICD-10-CM

## 2018-12-04 DIAGNOSIS — M542 Cervicalgia: Secondary | ICD-10-CM | POA: Insufficient documentation

## 2018-12-04 DIAGNOSIS — R293 Abnormal posture: Secondary | ICD-10-CM | POA: Diagnosis present

## 2018-12-04 DIAGNOSIS — M6281 Muscle weakness (generalized): Secondary | ICD-10-CM | POA: Insufficient documentation

## 2018-12-04 NOTE — Patient Instructions (Addendum)
Posture Tips DO: - stand tall and erect - keep chin tucked in - keep head and shoulders in alignment - check posture regularly in mirror or large window - pull head back against headrest in car seat;  Change your position often.  Sit with lumbar support. DON'T: - slouch or slump while watching TV or reading - sit, stand or lie in one position  for too long;  Sitting is especially hard on the spine so if you sit at a desk/use the computer, then stand up often!   Copyright  VHI. All rights reserved.  Posture - Standing   Good posture is important. Avoid slouching and forward head thrust. Maintain curve in low back and align ears over shoul- ders, hips over ankles.  Pull your belly button in toward your back bone. Stand with ribs lifted up and chin down.   Copyright  VHI. All rights reserved.  Posture - Sitting   Sit upright, head facing forward. Try using a roll to support lower back. Keep shoulders relaxed, and avoid rounded back. Keep hips level with knees. Avoid crossing legs for long periods. Sit on sit bones and not tail bone   Copyright  VHI. All rights reserved.   Levator Stretch   Grasp seat or sit on hand on side to be stretched. Turn head toward other side and look down. Use hand on head to gently stretch neck in that position. Hold _30___ seconds. Repeat on other side. Repeat __3__ times. Do __3__ sessions per day. DONT push head with hand  [email or web address]   Copyright  VHI. All rights reserved.  Side-Bending   One hand on opposite side of head, pull head to side as far as is comfortable. Stop if there is pain. Hold _30___ seconds. Repeat with other hand to other side. Repeat _3___ times. Do __3__ sessions per day. Dont push head with hand.  Hold onto chair to anchor as shown in clinic   Copyright  VHI. All rights reserved.  Scapular Retraction (Standing)   With arms at sides, pinch shoulder blades together. Repeat __10__ times per set. Do _2___ sets per  session. Do _1___ sessions per day.  [email or web address]       Copyright  VHI. All rights reserved.  Jennifer Sims, PT Certified Exercise Expert for the Aging Adult  12/04/18 9:41 AM Phone: [phone] Fax: [phone]

## 2018-12-04 NOTE — Therapy (Addendum)
[place], [place], [address] Phone: [phone]   Fax:  [phone]  Physical Therapy Evaluation  Patient Details  Name: Jennifer Sims MRN: [id number] Date of Birth: 25-Apr-1966 Referring Provider (PT): Sarina Ill MD   Encounter Date: 12/04/2018  PT End of Session - 12/04/18 0934    Visit Number  1    Number of Visits  13    Date for PT Re-Evaluation  01/17/19    Authorization Type  BCBS    PT Start Time  0933    PT Stop Time  1015    PT Time Calculation (min)  42 min    Activity Tolerance  Patient tolerated treatment well    Behavior During Therapy  [facility] for tasks assessed/performed       Past Medical History:  Diagnosis Date  . Crohn's disease ([place])   . Migraine   . Osteopenia     Past Surgical History:  Procedure Laterality Date  . CHOLECYSTECTOMY  2009    There were no vitals filed for this visit.   Subjective Assessment - 12/04/18 0958    Subjective  I feel tightness at base of neck and shoulders, I need to remind myself to hold neck up. 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  sitting and using a computer for 1 hour    How long can you stand comfortably?  1 hour,     How long can you walk comfortably?  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   No    Is the patient reluctant to leave their home because of a fear of falling?   No      Home Environment   Living Environment  Private residence    Available Help at Discharge  Family    Type of [place] to enter    Entrance  Stairs-Number of Steps  5    Entrance Stairs-Rails  Left    Home Layout  Multi-level      Prior Function   Level of Independence  Independent      Cognition   Overall Cognitive Status  Within Functional Limits for tasks assessed      Observation/Other Assessments   Focus on Therapeutic Outcomes (FOTO)   Intake 60% limitation 40% predicted 30%      Sensation   Light Touch  Appears Intact    Stereognosis  Appears Intact    Hot/Cold  Appears Intact      Posture/Postural Control   Posture/Postural Control  Postural limitations    Postural Limitations  Rounded Shoulders;Forward head;Anterior pelvic tilt    Posture Comments  right shoulder more anterior       AROM   Overall AROM   Deficits    Cervical Flexion  78    Cervical Extension  60   ERP   Cervical - Right Side Bend  52    Cervical - Left Side Bend  40    Cervical - Right Rotation  60    Cervical - Left Rotation  50      Strength   Overall Strength  Deficits    Overall Strength Comments  supine neck flexion 10 sec  normal 35 sec    Right Shoulder Flexion  5/5    Right Shoulder Extension  5/5    Right Shoulder ABduction  5/5    Right  Shoulder Internal Rotation  5/5    Right Shoulder External Rotation  4/5    Left Shoulder Flexion  5/5    Left Shoulder Extension  5/5    Left Shoulder ABduction  5/5    Left Shoulder Internal Rotation  4/5    Left Shoulder External Rotation  4-/5      Palpation   Palpation comment  TTP bil cervical paraspinals and bil Upper trap / levator tightness.                 Objective measurements completed on examination: See above findings.      [facility] Adult PT Treatment/Exercise - 12/04/18 1004      Self-Care   Self-Care  Posture    Posture  initial sitting and standing posture      Neck Exercises: Stretches   Upper Trapezius Stretch  3 reps;Right;Left;30 seconds    Levator Stretch  Right;Left;3 reps;30 seconds    Corner Stretch  30 seconds;3 reps    Other Neck Stretches  supine deep neck flexors 10 x 10 sec hold    Other Neck Stretches  scapular retraction 2 x 10             PT Education - 12/04/18 1014    Education Details  POC Explanation of findings initial HEP and posture    Person(s) Educated  Patient    Methods  Explanation;Demonstration;Tactile cues;Verbal cues;Handout    Comprehension  Verbalized understanding;Returned demonstration       PT Short Term Goals - 12/04/18 1454      PT SHORT TERM GOAL #1   Title  STG=LTG        PT Long Term Goals - 12/04/18 1454      PT LONG TERM GOAL #1   Title  Pt will be independent with advanced HEP    Time  6    Period  Weeks    Status  New    Target Date  01/17/19  PT LONG TERM GOAL #2   Title  "Demonstrate understanding of proper sitting posture, body mechanics, work ergonomics, and be more conscious of position and posture throughout the day.     Time  6    Period  Weeks    Status  New    Target Date  01/17/19      PT LONG TERM GOAL #3   Title  "Pt will tolerate working on computer for 2 hours without increased pain in order to return to PLOF and work. as professor    Time  6    Period  Weeks     Status  New    Target Date  01/17/19      PT LONG TERM GOAL #4   Title  "FOTO will improve from 40% limitation   to 30% limitation    indicating improved functional mobility.     Baseline  eval 40% limitation    Time  6    Period  Weeks    Status  New    Target Date  01/17/19      PT LONG TERM GOAL #5   Title  Pt will be able to tolerate standing for lecture at university for at least 1 hour with no pain exacerbation greater than 2/10    Time  6    Period  Weeks    Status  New    Target Date  01/17/19      PT LONG TERM GOAL #6   Title  Pt will be able to demonstrate lifting of UE  over head to place books /items  25# in order to organize college room without increased pain > 2/10    Time  6    Period  Weeks    Status  New    Target Date  01/17/19             Plan - 12/04/18 1748    Clinical Impression Statement  52 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 presents with neck tightness and pain at 3/10 and goes up to 5/10 pain bil cervical paraspinals and Upper trap and levator Pt seeing Dr. Jaynee Eagles for botox injections and migraine pain control and presents with increased tightness of levator/ upper trap tightness.  Pt has difficulty driving, standing for 1 hour for lecturing and lifting books above head as needed for teaching duties. . Pt would benefit from skilled PT to address impairments, pain and weaknesses  and to return to PLOF with minimal pain/ headaches  Affecting duties as mother/wife and professor    Clinical Presentation  Stable    Clinical Decision Making  Low    Rehab Potential  Good    PT Frequency  2x / week    PT Duration  6 weeks    PT Treatment/Interventions  ADLs/Self Care Home Management;Electrical Stimulation;Cryotherapy;Iontophoresis 51m/ml Dexamethasone;Moist Heat;Ultrasound;Spinal Manipulations;Taping;Dry needling;Passive range of motion;Patient/family education;Manual techniques;Neuromuscular re-education;Therapeutic exercise;Therapeutic activities     PT Next Visit Plan  TPDN and progress deep neck flexor    PT Home Exercise Plan  neck levator and upper trap, corner stretch and deep neck flexor    Consulted and Agree with Plan of Care  Patient       Patient will benefit from skilled therapeutic intervention in order to improve the following deficits and impairments:  Pain, Impaired UE functional use, Decreased activity tolerance, Decreased range of motion, Decreased strength, Increased fascial restricitons, Increased muscle spasms, Postural dysfunction, Improper body mechanics  Visit Diagnosis:  Cervicalgia - Plan: PT plan of care cert/re-cert  Muscle weakness (generalized) - Plan: PT plan of care cert/re-cert  Cramp and spasm - Plan: PT plan of care cert/re-cert  Abnormal posture - Plan: PT plan of care cert/re-cert     Problem List Patient Active Problem List   Diagnosis Date Noted  . 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 Excessive daytime sleepiness 03/13/2018  . Snoring 03/13/2018  . Chronic migraine without aura, with intractable migraine, so stated, with status migrainosus 08/03/2016   Voncille Lo, PT Certified Exercise Expert for the Aging Adult  12/04/18 6:07 PM Phone: [phone] Fax: [facility] [address] [place], [place], [address] Phone: [phone]   Fax:  [phone]  Name: Jennifer Sims MRN: [id number] Date of Birth: 03/17/1966

## 2018-12-11 ENCOUNTER — Encounter: Payer: Self-pay | Admitting: Physical Therapy

## 2018-12-11 ENCOUNTER — Ambulatory Visit: Payer: BC Managed Care – PPO | Admitting: Physical Therapy

## 2018-12-11 DIAGNOSIS — R293 Abnormal posture: Secondary | ICD-10-CM

## 2018-12-11 DIAGNOSIS — M542 Cervicalgia: Secondary | ICD-10-CM | POA: Diagnosis not present

## 2018-12-11 DIAGNOSIS — M6281 Muscle weakness (generalized): Secondary | ICD-10-CM

## 2018-12-11 DIAGNOSIS — R252 Cramp and spasm: Secondary | ICD-10-CM

## 2018-12-11 NOTE — Patient Instructions (Addendum)
Over Head Pull: Narrow Grip       On back, knees bent, feet flat, band across thighs, elbows straight but relaxed. Pull hands apart (start). Keeping elbows straight, bring arms up and over head, hands toward floor. Keep pull steady on band. Hold momentarily. Return slowly, keeping pull steady, back to start. Repeat _15 x 2__ times. Band color _green_____   Side Pull: Double Arm   On back, knees bent, feet flat. Arms perpendicular to body, shoulder level, elbows straight but relaxed. Pull arms out to sides, elbows straight. Resistance band comes across collarbones, hands toward floor. Hold momentarily. Slowly return to starting position. Repeat _15 x 2__ times. Band color __green___   Sash   On back, knees bent, feet flat, left hand on left hip, right hand above left. Pull right arm DIAGONALLY (hip to shoulder) across chest. Bring right arm along head toward floor. Hold momentarily. Slowly return to starting position. Repeat _15 x 2__ times. Do with left arm. Band color __green____   Shoulder Rotation: Double Arm   On back, knees bent, feet flat, elbows tucked at sides, bent 90, hands palms up. Pull hands apart and down toward floor, keeping elbows near sides. Hold momentarily. Slowly return to starting position. Repeat _15 x2__ times. Band color green______   Trigger Point Dry Needling  . What is Trigger Point Dry Needling (DN)? o DN is a physical therapy technique used to treat muscle pain and dysfunction. Specifically, DN helps deactivate muscle trigger points (muscle knots).  o A thin filiform needle is used to penetrate the skin and stimulate the underlying trigger point. The goal is for a local twitch response (LTR) to occur and for the trigger point to relax. No medication of any kind is injected during the procedure.   . What Does Trigger Point Dry Needling Feel Like?  o The procedure feels different for each individual patient. Some patients report that they do not actually  feel the needle enter the skin and overall the process is not painful. Very mild bleeding may occur. However, many patients feel a deep cramping in the muscle in which the needle was inserted. This is the local twitch response.   Marland Kitchen How Will I feel after the treatment? o Soreness is normal, and the onset of soreness may not occur for a few hours. Typically this soreness does not last longer than two days.  o Bruising is uncommon, however; ice can be used to decrease any possible bruising.  o In rare cases feeling tired or nauseous after the treatment is normal. In addition, your symptoms may get worse before they get better, this period will typically not last longer than 24 hours.   . What Can I do After My Treatment? o Increase your hydration by drinking more water for the next 24 hours. o You may place ice or heat on the areas treated that have become sore, however, do not use heat on inflamed or bruised areas. Heat often brings more relief post needling. o You can continue your regular activities, but vigorous activity is not recommended initially after the treatment for 24 hours. o DN is best combined with other physical therapy such as strengthening, stretching, and other therapies.    Voncille Lo, PT Certified Exercise Expert for the Aging Adult  12/11/18 12:04 PM Phone: [phone] Fax: [phone]

## 2018-12-11 NOTE — Therapy (Signed)
[place], [place], [address] Phone: [phone]   Fax:  [phone]  Physical Therapy Treatment  Patient Details  Name: Jennifer Sims MRN: [id number] Date of Birth: 1966-11-29 Referring Provider (PT): Sarina Ill MD   Encounter Date: 12/11/2018  PT End of Session - 12/11/18 1241    Visit Number  2    Number of Visits  13    Date for PT Re-Evaluation  01/17/19    Authorization Type  BCBS    PT Start Time  1147    PT Stop Time  1245    PT Time Calculation (min)  58 min    Activity Tolerance  Patient tolerated treatment well    Behavior During Therapy  [facility] for tasks assessed/performed       Past Medical History:  Diagnosis Date  . Crohn's disease ([place])   . Migraine   . Osteopenia     Past Surgical History:  Procedure Laterality Date  . CHOLECYSTECTOMY  2009    There were no vitals filed for this visit.  Subjective Assessment - 12/11/18 1155    Subjective  I feel tightness at base of neck and I have some tension in life.     How long can you sit comfortably?  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  Yes    Pain Score  4     Pain Location  Neck    Pain Orientation  Right;Left    Pain Descriptors / Indicators  Discomfort    Pain Type  Chronic pain    Pain Onset  More than a month ago    Pain Frequency  Intermittent                       [facility] Adult PT Treatment/Exercise - 12/11/18 0001      Self-Care   Self-Care  Other Self-Care Comments    Other Self-Care Comments   education on TPDN and after care and precautians      Neck Exercises: Supine   Other Supine Exercise  supine scapular stabilizers green t band  bil flexion, horizontal abd , diagonals and ER  2 x 10 each       Modalities   Modalities  Moist Heat      Moist Heat  Therapy   Number Minutes Moist Heat  15 Minutes    Moist Heat Location  Cervical   upper back     Manual Therapy   Manual Therapy  Joint mobilization;Soft tissue mobilization    Manual therapy comments  skilled palpation for TPDN     Joint Mobilization  PA lateral C-2 to C-5     Soft tissue mobilization  upper trap bil and levator and bil scalenes        Neck Exercises: Stretches   Upper Trapezius Stretch  3 reps;Right;Left;30 seconds    Levator Stretch  Right;Left;3 reps;30 seconds    Corner Stretch  30 seconds;3 reps    Other Neck Stretches  supine deep neck flexors 10 x 10 sec hold   added deep breathing      Trigger Point Dry Needling - 12/11/18 1215    Consent Given?  Yes    Education Handout Provided  Yes    Muscles Treated Upper Body  Scalenes;Upper trapezius;Levator scapulae   bil   Scalenes Response  Twitch reponse elicited;Palpable increased muscle length    Upper Trapezius Response  Twitch reponse elicited;Palpable increased muscle length    Levator Scapulae Response  Twitch response elicited;Palpable increased muscle length           PT Education - 12/11/18 1204    Education Details  added HEP for supine scapular stabilizers education on TPDN    Person(s) Educated  Patient    Methods  Explanation;Demonstration;Tactile cues;Verbal cues    Comprehension  Verbalized understanding;Returned demonstration       PT Short Term Goals - 12/04/18 1454      PT SHORT TERM GOAL #1   Title  STG=LTG        PT Long Term Goals - 12/04/18 1454      PT LONG TERM GOAL #1   Title  Pt will be independent with advanced HEP    Time  6    Period  Weeks    Status  New    Target Date  01/17/19      PT LONG TERM GOAL #2   Title  "Demonstrate understanding of proper sitting posture, body mechanics, work ergonomics, and be more conscious of position and posture throughout the day.     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  Pt was educated on supine scapular stabilizers with green t band for strength and had decreased pain to 2/10 at end of session    Rehab Potential  Good    PT Frequency  2x / week    PT Duration  6 weeks    PT Treatment/Interventions  ADLs/Self Care Home Management;Electrical  Stimulation;Cryotherapy;Iontophoresis 55m/ml Dexamethasone;Moist Heat;Ultrasound;Spinal Manipulations;Taping;Dry needling;Passive range of motion;Patient/family education;Manual techniques;Neuromuscular re-education;Therapeutic exercise;Therapeutic activities    PT Next Visit Plan  TPDN and progress deep neck flexor    PT Home Exercise Plan  neck levator and upper trap, corner stretch and deep neck flexor supine scapular stabilzers green t band    Consulted and Agree with Plan of Care  Patient       Patient will benefit  from skilled therapeutic intervention in order to improve the following deficits and impairments:  Pain, Impaired UE functional use, Decreased activity tolerance, Decreased range of motion, Decreased strength, Increased fascial restricitons, Increased muscle spasms, Postural dysfunction, Improper body mechanics  Visit Diagnosis: Cervicalgia  Muscle weakness (generalized)  Cramp and spasm  Abnormal posture     Problem List Patient Active Problem List   Diagnosis Date Noted  . 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 Excessive daytime sleepiness 03/13/2018  . Snoring 03/13/2018  . Chronic migraine without aura, with intractable migraine, so stated, with status migrainosus 08/03/2016    LVoncille Lo PT Certified Exercise Expert for the Aging Adult  12/11/18 12:41 PM Phone: [phone]: [facility] [address]Phone: [phone]  Fax:  [phone] Name: Jennifer Sims MRN: [id number] of Birth: 3Jul 20, 1967

## 2018-12-20 ENCOUNTER — Ambulatory Visit: Payer: BC Managed Care – PPO | Admitting: Physical Therapy

## 2018-12-20 ENCOUNTER — Encounter: Payer: Self-pay | Admitting: Physical Therapy

## 2018-12-20 DIAGNOSIS — R252 Cramp and spasm: Secondary | ICD-10-CM

## 2018-12-20 DIAGNOSIS — M6281 Muscle weakness (generalized): Secondary | ICD-10-CM

## 2018-12-20 DIAGNOSIS — M542 Cervicalgia: Secondary | ICD-10-CM | POA: Diagnosis not present

## 2018-12-20 DIAGNOSIS — R293 Abnormal posture: Secondary | ICD-10-CM

## 2018-12-20 NOTE — Therapy (Addendum)
[facility] Outpatient Rehabilitation [facility] [address] [place], [place], [address] Phone: [phone]   Fax:  [phone]  Physical Therapy Treatment  Patient Details  Name: Jennifer Sims Cousins-Cooper MRN: [id number] Date of Birth: 1966/03/13 Referring Provider (PT): Naomie Dean MD   Encounter Date: 12/20/2018  PT End of Session - 12/20/18 0952    Visit Number  3    Number of Visits  13    Date for PT Re-Evaluation  01/17/19    Authorization Type  BCBS    PT Start Time  0935    PT Stop Time  1030    PT Time Calculation (min)  55 min    Activity Tolerance  Patient tolerated treatment well    Behavior During Therapy  [facility] for tasks assessed/performed       Past Medical History:  Diagnosis Date  . Crohn's disease (HCC)   . Migraine   . Osteopenia     Past Surgical History:  Procedure Laterality Date  . CHOLECYSTECTOMY  2009    There were no vitals filed for this visit.  Subjective Assessment - 12/20/18 0937    Subjective  I had to prepare food for 23 people for Christmas.    How long can you sit comfortably?  sitting and using a computer for 1 hour    How long can you stand comfortably?  1 hour,  more pain yesterday because of cooking for Christmas    How long can you walk comfortably?  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  Yes    Pain Score  3     Pain Location  Neck    Pain Orientation  Right;Left    Pain Descriptors / Indicators  Discomfort    Pain Type  Chronic pain    Pain Onset  More than a month ago    Pain Frequency  Intermittent                       [facility] Adult PT Treatment/Exercise - 12/20/18 0954      Self-Care   Self-Care  Posture;ADL's    ADL's  pt given Handout and went over initially    Posture  pt given handout and went over  initially      Neck  Exercises: Standing   Other Standing Exercises  standing neck isometric for ext and lateral bend 10 x with ball on wall       Neck Exercises: Supine   Other Supine Exercise  supine scapular stabilizers blue t band  bil flexion, horizontal abd , diagonals and ER  2 x 10 each       Modalities   Modalities  Moist Heat      Moist Heat Therapy   Moist Heat Location  Cervical   upper back     Manual Therapy   Manual Therapy  Joint mobilization;Soft tissue mobilization    Manual therapy comments  skilled palpation for TPDN     Joint Mobilization  PA lateral C-2 to C-5     Soft tissue mobilization  upper trap bil and levator and right scalenes        Neck Exercises: Stretches   Upper Trapezius Stretch  2 reps;Right;Left;30 seconds    Levator Stretch  Right;Left;2 reps;30 seconds  Other Neck Stretches  supine deep neck flexors 10 x 10 sec hold   added deep breathing      Trigger Point Dry Needling - 12/20/18 0955    Consent Given?  Yes    Education Handout Provided  No   previously given   Muscles Treated Upper Body  Upper trapezius;Levator scapulae;Rhomboids;Scalenes   bil except only right scalene   Scalenes Response  Palpable increased muscle length   right side only   Upper Trapezius Response  Twitch reponse elicited;Palpable increased muscle length    Levator Scapulae Response  Twitch response elicited;Palpable increased muscle length    Rhomboids Response  Twitch response elicited;Palpable increased muscle length           PT Education - 12/20/18 0954    Education Details  reinforce HEP with blue t band    Person(s) Educated  Patient    Methods  Explanation;Demonstration;Tactile cues;Verbal cues    Comprehension  Verbalized understanding;Returned demonstration       PT Short Term Goals - 12/20/18 0939      PT SHORT TERM GOAL #1   Title  STG=LTG        PT Long Term Goals - 12/20/18 1018      PT LONG TERM GOAL #1   Title  Pt will be independent with  advanced HEP    Baseline  Pt given initial HEP    Time  6    Period  Weeks    Status  On-going      PT LONG TERM GOAL #2   Title  "Demonstrate understanding of proper sitting posture, body mechanics, work ergonomics, and be more conscious of position and posture throughout the day.     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 Pt  was educated on Teacher, early years/pre and reviewed exercise. Pt consented to TPDN and was closely monitored   PLan review posture/body Teacher, adult education. Review HEP and Assess TPDN   Patient will benefit from skilled therapeutic intervention in order to improve the following deficits and impairments:     Visit Diagnosis: Cervicalgia  Muscle weakness  (generalized)  Cramp and spasm  Abnormal posture     Problem List Patient Active Problem List   Diagnosis Date Noted  . 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 Excessive daytime sleepiness 03/13/2018  . Snoring 03/13/2018  . Chronic migraine without aura, with intractable migraine, so stated, with status migrainosus 08/03/2016    Garen Lah, PT Certified Exercise Expert for the Aging Adult  12/20/18 12:08 PM Phone: [phone] Fax: [phone]  [facility] Outpatient Rehabilitation [facility] [address] [place], [place], [address] Phone: [phone]   Fax:  [phone]  Name: Arelys Glassco Cousins-Cooper MRN: [id number] Date of Birth: Aug 16, 1966

## 2018-12-20 NOTE — Patient Instructions (Signed)

## 2018-12-25 ENCOUNTER — Ambulatory Visit: Payer: BC Managed Care – PPO | Admitting: Physical Therapy

## 2018-12-25 DIAGNOSIS — M542 Cervicalgia: Secondary | ICD-10-CM | POA: Diagnosis not present

## 2018-12-25 DIAGNOSIS — M6281 Muscle weakness (generalized): Secondary | ICD-10-CM

## 2018-12-25 DIAGNOSIS — R293 Abnormal posture: Secondary | ICD-10-CM

## 2018-12-25 DIAGNOSIS — R252 Cramp and spasm: Secondary | ICD-10-CM

## 2018-12-25 NOTE — Therapy (Signed)
[facility] Outpatient Rehabilitation [facility] [address] [place], [place], [address] Phone: [phone]   Fax:  [phone]  Physical Therapy Treatment  Patient Details  Name: Jennifer Sims MRN: [id number] Date of Birth: 01-Jul-1966 Referring Provider (PT): Naomie Dean MD   Encounter Date: 12/25/2018  PT End of Session - 12/25/18 1322    Visit Number  4    Number of Visits  13    Date for PT Re-Evaluation  01/17/19    Authorization Type  BCBS    PT Start Time  1017    PT Stop Time  1112    PT Time Calculation (min)  55 min    Activity Tolerance  Patient tolerated treatment well    Behavior During Therapy  [facility] for tasks assessed/performed       Past Medical History:  Diagnosis Date  . Crohn's disease (HCC)   . Migraine   . Osteopenia     Past Surgical History:  Procedure Laterality Date  . CHOLECYSTECTOMY  2009    There were no vitals filed for this visit.  Subjective Assessment - 12/25/18 1024    Subjective  I am not preparing for people for New Years    How long can you sit comfortably?  sitting and using a computer for 1 hour to 2 hours    How long can you stand comfortably?  1 hour    How long can you walk comfortably?  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  Yes    Pain Score  2     Pain Location  Neck    Pain Orientation  Right;Left    Pain Descriptors / Indicators  Discomfort    Pain Type  Chronic pain    Pain Onset  More than a month ago    Pain Frequency  Intermittent                       [facility] Adult PT Treatment/Exercise - 12/25/18 1040      Self-Care   Self-Care  Lifting;ADL's;Posture    ADL's  reivewed handout and helps at computer and Geographical information systems officer  handout, care for neck when kneeling and lifting    Posture  sitting,standing and  sleeping      Neck Exercises: Standing   Other Standing Exercises  standing neck isometric for ext and lateral bend 10 x with ball on wall       Moist Heat Therapy   Number Minutes Moist Heat  15 Minutes    Moist Heat Location  Cervical   upper back     Manual Therapy   Manual Therapy  Joint mobilization;Soft tissue mobilization    Manual therapy comments  skilled palpation for TPDN     Joint Mobilization  PA lateral C-2 to C-5     Soft tissue mobilization  upper trap bil and levator and rrhomboids      Neck Exercises: Stretches   Upper Trapezius Stretch  2 reps;Right;Left;30 seconds   with towel anchor   Levator Stretch  Right;Left;2 reps;30 seconds   with towel anchor   Corner Stretch  30 seconds;3 reps    Other Neck Stretches  supine deep neck flexors 10 x 10 sec hold   added deep breathing  Trigger Point Dry Needling - 12/25/18 1045    Consent Given?  Yes    Education Handout Provided  --   previously given   Muscles Treated Upper Body  Upper trapezius;Levator scapulae;Rhomboids   C-2 to C-5/6   Upper Trapezius Response  Twitch reponse elicited;Palpable increased muscle length    Levator Scapulae Response  Twitch response elicited;Palpable increased muscle length    Rhomboids Response  Twitch response elicited;Palpable increased muscle length           PT Education - 12/25/18 1037    Education Details  added towel cervical ex and isometric for side bend and neck extension.    Person(s) Educated  Patient    Methods  Explanation;Demonstration;Verbal cues;Handout;Tactile cues    Comprehension  Verbalized understanding;Returned demonstration       PT Short Term Goals - 12/20/18 0939      PT SHORT TERM GOAL #1   Title  STG=LTG        PT Long Term Goals - 12/20/18 1018      PT LONG TERM GOAL #1   Title  Pt will be independent with advanced HEP    Baseline  Pt given initial HEP    Time  6    Period  Weeks    Status  On-going      PT LONG TERM GOAL  #2   Title  "Demonstrate understanding of proper sitting posture, body mechanics, work ergonomics, and be more conscious of position and posture throughout the day.     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  PT discussed use of exercise and movement when discomfort and pain arises.  Pt was feeling more relaxed after exercise but still consented to [facility] and  reports she always sleeps better afterwards.  Pt working toward completion of goals to be assessed next visit    Rehab Potential  Good    PT Frequency  2x / week    PT Duration  6 weeks    PT Treatment/Interventions  ADLs/Self Care Home Management;Electrical Stimulation;Cryotherapy;Iontophoresis 4mg /ml Dexamethasone;Moist Heat;Ultrasound;Spinal Manipulations;Taping;Dry needling;Passive  range of motion;Patient/family education;Manual techniques;Neuromuscular re-education;Therapeutic exercise;Therapeutic activities    PT Next Visit Plan  goals.  qped and prone strength for neck    PT Home Exercise Plan  neck levator and upper trap, corner stretch and deep neck flexor supine scapular stabilzers green t band    Consulted and Agree with Plan of Care  Patient       Patient will benefit from skilled therapeutic intervention in order to improve the following deficits and impairments:  Pain, Impaired UE functional use, Decreased activity tolerance, Decreased range of motion, Decreased strength, Increased fascial restricitons, Increased muscle spasms, Postural dysfunction, Improper body mechanics  Visit Diagnosis: Cervicalgia  Muscle weakness (generalized)  Cramp and spasm  Abnormal posture     Problem List Patient Active Problem List   Diagnosis Date Noted  . 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 Excessive daytime sleepiness 03/13/2018  . Snoring 03/13/2018  . Chronic migraine without aura, with intractable migraine, so stated, with status migrainosus 08/03/2016    Garen Lah, PT Certified Exercise Expert for the Aging Adult  12/25/18 1:29 PM Phone: [phone] Fax: [phone]  [facility] Outpatient Rehabilitation [facility] [address] [place], [place], [address] Phone: [phone]   Fax:  [phone]  Name: Jennifer Sims MRN: [id number] Date of Birth: 10-25-1966

## 2018-12-25 NOTE — Patient Instructions (Signed)
     For upper trap and levator. First anchor towel over shoulder with and then stretch as normal both muscles .   Garen Lah, PT Certified Exercise Expert for the Aging Adult  12/25/18 10:37 AM Phone: [phone] Fax: [phone]

## 2019-01-01 ENCOUNTER — Ambulatory Visit: Payer: BC Managed Care – PPO | Attending: Neurology | Admitting: Physical Therapy

## 2019-01-01 ENCOUNTER — Other Ambulatory Visit: Payer: Self-pay

## 2019-01-01 ENCOUNTER — Encounter: Payer: Self-pay | Admitting: Physical Therapy

## 2019-01-01 DIAGNOSIS — R293 Abnormal posture: Secondary | ICD-10-CM | POA: Insufficient documentation

## 2019-01-01 DIAGNOSIS — M6281 Muscle weakness (generalized): Secondary | ICD-10-CM | POA: Insufficient documentation

## 2019-01-01 DIAGNOSIS — R252 Cramp and spasm: Secondary | ICD-10-CM | POA: Insufficient documentation

## 2019-01-01 DIAGNOSIS — M542 Cervicalgia: Secondary | ICD-10-CM | POA: Diagnosis not present

## 2019-01-01 NOTE — Patient Instructions (Signed)
       Jennifer Sims, PT Certified Exercise Expert for the Aging Adult  01/01/19 9:20 AM Phone: [phone] Fax: [phone]

## 2019-01-01 NOTE — Therapy (Signed)
[facility] [address] [place], [place], [address] Phone: [phone]   Fax:  [phone]  Physical Therapy Treatment  Patient Details  Name: Jennifer Sims MRN: [id number] Date of Birth: 1966-08-25 Referring Provider (PT): Naomie DeanAhern, Antonia MD   Encounter Date: 01/01/2019  PT End of Session - 01/01/19 0848    Visit Number  5    Number of Visits  13    Date for PT Re-Evaluation  01/17/19    Authorization Type  BCBS    PT Start Time  0848    PT Stop Time  0930    PT Time Calculation (min)  42 min    Activity Tolerance  Patient tolerated treatment well    Behavior During Therapy  [facility] for tasks assessed/performed       Past Medical History:  Diagnosis Date  . Crohn's disease (HCC)   . Migraine   . Osteopenia     Past Surgical History:  Procedure Laterality Date  . CHOLECYSTECTOMY  2009    There were no vitals filed for this visit.  Subjective Assessment - 01/01/19 0851    Subjective  I am not sure but I think I may have slept on my neck wrong but I was in more pain but it has subsided somewhat.     How long can you sit comfortably?  sitting and using a computer for 1 hour to 2 hours    Patient Stated Goals  improve posture and get HEP , pain relief ideas to continue my professorial  duties    Currently in Pain?  Yes    Pain Score  2     Pain Location  Neck    Pain Descriptors / Indicators  Tightness;Discomfort    Pain Type  Chronic pain    Pain Onset  More than a month ago    Pain Frequency  Intermittent                       [facility] Adult PT Treatment/Exercise - 01/01/19 [id number]      Neck Exercises: Seated   Other Seated Exercise  rotation towel cervical with TC and VC re explanation. and demo.    pt with relief with this stretch     Neck Exercises: Supine   Other Supine Exercise  thoracic stretch with pink foam roller hands clasped .  then  thoracic mobilization with pink foam roller  horizontal.       Other Supine Exercise  pink foam roller lengthwise.  scapular stabilizers. red t band  with bil flex, horizontal abd, diagonals and ER with red t band x 15 x 2     VC and TC for balance on foam roller     Neck Exercises: Prone   Plank  Pt dead bug starting with table top position beginning. x 10    Other Prone Exercise  q ped thoracic stretch with under reach x 10 to right and left   added to HEP   Other Prone Exercise   q ped arms raise x 10 , q ped legs rasie x 10 and q ped opposite arms raise and leg raise x 10      Neck Exercises: Stretches   Upper Trapezius Stretch  2 reps;Right;Left;30 seconds   with towel anchor   Levator Stretch  2 reps;Right;Left;30 seconds   with towel anchor   Corner Stretch  30 seconds;3 reps    Other Neck Stretches  supine  deep neck flexors 10 x 10 sec hold   deep neck flex with captial flex x 10             PT Education - 01/01/19 0921    Education Details  Added foam roller HEP , deadbug    Person(s) Educated  Patient    Methods  Explanation;Demonstration;Tactile cues;Verbal cues;Handout    Comprehension  Verbalized understanding;Returned demonstration       PT Short Term Goals - 12/20/18 0939      PT SHORT TERM GOAL #1   Title  STG=LTG        PT Long Term Goals - 12/20/18 1018      PT LONG TERM GOAL #1   Title  Pt will be independent with advanced HEP    Baseline  Pt given initial HEP    Time  6    Period  Weeks    Status  On-going      PT LONG TERM GOAL #2   Title  "Demonstrate understanding of proper sitting posture, body mechanics, work ergonomics, and be more conscious of position and posture throughout the day.     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  Pt tries new self mobiizing and stretching using foam roller and ends with 0/10 pain today.  Will continue with TPDN as needed next visit and progressing strengthening.    Rehab Potential  Good    PT Frequency  2x / week    PT Duration  6 weeks    PT Treatment/Interventions  ADLs/Self Care Home Management;Electrical Stimulation;Cryotherapy;Iontophoresis 4mg /ml Dexamethasone;Moist Heat;Ultrasound;Spinal Manipulations;Taping;Dry needling;Passive range of motion;Patient/family education;Manual techniques;Neuromuscular re-education;Therapeutic exercise;Therapeutic activities    PT Next Visit Plan  goals.  qped and prone strength for neck    PT Home Exercise Plan  neck levator and upper trap, corner stretch and deep neck flexor supine scapular stabilzers green t band       Patient will benefit from skilled therapeutic intervention in order to improve the following deficits and impairments:  Pain, Impaired UE functional use, Decreased activity  tolerance, Decreased range of motion, Decreased strength, Increased fascial restricitons, Increased muscle spasms, Postural dysfunction, Improper body mechanics  Visit Diagnosis: Cervicalgia  Muscle weakness (generalized)  Cramp and spasm  Abnormal posture     Problem List Patient Active Problem List   Diagnosis Date Noted  . 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 Excessive daytime sleepiness 03/13/2018  . Snoring 03/13/2018  . Chronic migraine without aura, with intractable migraine, so stated, with status migrainosus 08/03/2016   Garen Lah, PT Certified Exercise Expert for the Aging Adult  01/01/19 12:48 PM Phone: [phone] Fax: [phone]  [facility] Outpatient Rehabilitation [facility] [address] [place], [place], [address] Phone: [phone]   Fax:  [phone]  Name: Jennifer Sims MRN: [id number] Date of Birth: 12/28/1965

## 2019-01-08 ENCOUNTER — Encounter: Payer: Self-pay | Admitting: Physical Therapy

## 2019-01-08 ENCOUNTER — Ambulatory Visit: Payer: BC Managed Care – PPO | Admitting: Physical Therapy

## 2019-01-08 DIAGNOSIS — M542 Cervicalgia: Secondary | ICD-10-CM

## 2019-01-08 DIAGNOSIS — M6281 Muscle weakness (generalized): Secondary | ICD-10-CM

## 2019-01-08 DIAGNOSIS — R293 Abnormal posture: Secondary | ICD-10-CM

## 2019-01-08 DIAGNOSIS — R252 Cramp and spasm: Secondary | ICD-10-CM

## 2019-01-08 NOTE — Therapy (Signed)
[place], [place], [address] Phone: [phone]   Fax:  [phone]  Physical Therapy Treatment  Patient Details  Name: Jennifer Sims Cousins-Cooper MRN: [id number] Date of Birth: 03/30/1966 Referring Provider (PT): Sarina Ill MD   Encounter Date: 01/08/2019  PT End of Session - 01/08/19 0956    Visit Number  6    Number of Visits  13    Date for PT Re-Evaluation  01/17/19    Authorization Type  BCBS    PT Start Time  0935    PT Stop Time  1020    PT Time Calculation (min)  45 min    Activity Tolerance  Patient tolerated treatment well    Behavior During Therapy  [facility] for tasks assessed/performed       Past Medical History:  Diagnosis Date  . Crohn's disease ([place])   . Migraine   . Osteopenia     Past Surgical History:  Procedure Laterality Date  . CHOLECYSTECTOMY  2009    There were no vitals filed for this visit.  Subjective Assessment - 01/08/19 0944    Subjective  I have returned to work now and I still have pain.   T TH I work on [employer]  . I woke up late and didnt do my exercise    How long can you sit comfortably?  sitting and using a computer for 1 hour to 2 hours learning how to use pain management strategies    How long can you stand comfortably?  60 to 90 minjutes    How long can you walk comfortably?  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  No/denies    Pain Score  0-No pain    Pain Location  Neck    Pain Orientation  Right;Left    Pain Descriptors / Indicators  Tightness;Discomfort    Pain Type  Chronic pain    Pain Onset  More than a month ago    Pain Frequency  Intermittent         [facility] PT Assessment - 01/08/19 1024      Observation/Other Assessments   Focus on Therapeutic Outcomes (FOTO)   FOTO intake 76% limitation  24% predicted 30%      AROM   Cervical Flexion  78    Cervical Extension  62    Cervical - Right Side Bend  52    Cervical - Left Side Bend  45    Cervical - Right Rotation  70    Cervical - Left Rotation  65      Strength   Overall Strength  Deficits    Overall Strength Comments  supine neck flexion 25 sec  normal 35 sec    Right Shoulder Flexion  5/5    Right Shoulder Extension  5/5    Right Shoulder ABduction  5/5    Right Shoulder Internal Rotation  5/5    Right Shoulder External Rotation  4/5    Left Shoulder Flexion  5/5    Left Shoulder Extension  5/5    Left Shoulder ABduction  5/5    Left Shoulder Internal Rotation  4+/5    Left Shoulder External Rotation  4+/5  [place] Adult PT Treatment/Exercise - 01/08/19 0951      Self-Care   Self-Care  Other Self-Care Comments    ADL's  reviewed handout    Lifting  used 15 lb and 25 lb KB    Posture  reviewed posture and pain management     Other Self-Care Comments   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 and need for making appt of time for self care to maintain pain free neck discomfort      Neck Exercises: Prone   Plank  prone with 2 lb with bil extension 15 x 2, T bil 15 x 2 with 2 lb and Y 1lb 15 x 2    Other Prone Exercise  q ped thoracic stretch with under reach x 10 to right and left   added to HEP   Other Prone Exercise   q ped arms raise x 15 , q ped legs rasie x 15 and q ped opposite arms raise and leg raise x 15      Moist Heat Therapy   Number Minutes Moist Heat  --   no heat available due to machine malfunction     Manual Therapy   Manual Therapy  Joint mobilization;Soft tissue mobilization    Manual therapy comments  skilled palpation for TPDN     Joint Mobilization  PA lateral C-2 to C-5    over strtching in all planes without pain   Soft tissue mobilization  upper trap bil and levator and rrhomboids       Trigger Point Dry Needling -  01/08/19 0953    Consent Given?  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 and discusssed  ideas and problem solved for job    [employer]) Educated  Patient    Methods  Explanation;Demonstration;Tactile cues;Verbal cues    Comprehension  Verbalized understanding;Returned demonstration       PT Short Term Goals - 01/08/19 1032      PT SHORT TERM GOAL #1   Title  STG=LTG        PT Long Term Goals - 01/08/19 1007      PT LONG TERM GOAL #1   Title  Pt will be independent with advanced HEP    Baseline  Pt given advanced HEP and pain management techniques    Time  6    Period  Weeks    Status  On-going      PT LONG TERM GOAL #2   Title  "Demonstrate understanding of proper sitting posture, body mechanics, work ergonomics, and be more conscious of position and posture throughout the day.     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 Working on lfting 25 lb , able to do 15 lb    Time  6    Period  Weeks    Status  On-going            Plan - 01/08/19 1018    Clinical Impression Statement  Pt enters for the first time without pain in neck, only tightness.  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  Pt acheived # 2, 4 and 5 LTG's and has at least 4+/5 MMT UE  wil continue for 2 more visits until DC for HEP reinforcement    Rehab Potential  Good    PT Frequency  2x / week    PT Duration  6 weeks    PT Treatment/Interventions  ADLs/Self Care Home Management;Electrical Stimulation;Cryotherapy;Iontophoresis 73m/ml Dexamethasone;Moist Heat;Ultrasound;Spinal Manipulations;Taping;Dry needling;Passive range of motion;Patient/family education;Manual techniques;Neuromuscular re-education;Therapeutic exercise;Therapeutic activities    PT Next Visit Plan  discuss community wellness and personal trainers.  q ped exercise strengthen with t band. 25 # overhead    PT Home Exercise Plan  neck levator and upper trap, corner stretch and deep neck flexor supine scapular stabilzers green t band    Consulted and Agree with Plan of Care  Patient       Patient will benefit from skilled therapeutic intervention in order to improve the following deficits and impairments:  Pain, Impaired UE functional use, Decreased activity tolerance, Decreased range of motion, Decreased  strength, Increased fascial restricitons, Increased muscle spasms, Postural dysfunction, Improper body mechanics  Visit Diagnosis: Cervicalgia  Muscle weakness (generalized)  Cramp and spasm  Abnormal posture     Problem List Patient Active Problem List   Diagnosis Date Noted  . 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 Excessive daytime sleepiness 03/13/2018  . Snoring 03/13/2018  . Chronic migraine without aura, with intractable migraine, so stated, with status migrainosus 08/03/2016   LVoncille Lo PT Certified Exercise Expert for the Aging Adult  01/08/19 10:39 AM Phone: [phone]: 3[facility] E[address] Trout StreetGLaredo [place] [address]Phone: [phone]  Fax:  [phone] Name: KCamrin LapreCousins-Cooper MRN: [id number]  Date of Birth: 1966/06/02

## 2019-01-10 ENCOUNTER — Ambulatory Visit: Payer: BC Managed Care – PPO | Admitting: Physical Therapy

## 2019-01-10 DIAGNOSIS — M542 Cervicalgia: Secondary | ICD-10-CM

## 2019-01-10 DIAGNOSIS — R293 Abnormal posture: Secondary | ICD-10-CM

## 2019-01-10 DIAGNOSIS — R252 Cramp and spasm: Secondary | ICD-10-CM

## 2019-01-10 DIAGNOSIS — M6281 Muscle weakness (generalized): Secondary | ICD-10-CM

## 2019-01-10 NOTE — Patient Instructions (Signed)
   Garen Lah, PT Certified Exercise Expert for the Aging Adult  01/10/19 10:45 AM Phone: [phone] Fax: [phone]

## 2019-01-10 NOTE — Therapy (Signed)
[place], [place], [address] Phone: [phone]   Fax:  [phone]  Physical Therapy Treatment  Patient Details  Name: Jennifer Sims MRN: [id number] Date of Birth: [birth year] Referring Provider (PT): Sarina Ill MD   Encounter Date: 01/10/2019  PT End of Session - 01/10/19 1046    Visit Number  7    Number of Visits  13    Date for PT Re-Evaluation  01/17/19    Authorization Type  BCBS    PT Start Time  1017    PT Stop Time  1100    PT Time Calculation (min)  43 min    Activity Tolerance  Patient tolerated treatment well    Behavior During Therapy  [facility] for tasks assessed/performed       Past Medical History:  Diagnosis Date  . Crohn's disease ([place])   . Migraine   . Osteopenia     Past Surgical History:  Procedure Laterality Date  . CHOLECYSTECTOMY  2009    There were no vitals filed for this visit.  Subjective Assessment - 01/10/19 1028    Subjective  I just came from the dentist. ( mouth droop on right)    Currently in Pain?  No/denies    Pain Score  0-No pain    Pain Location  Neck                       [facility] Adult PT Treatment/Exercise - 01/10/19 1047      Self-Care   Other Self-Care Comments   Discussed community wellness opportunities      Neck Exercises: Supine   Other Supine Exercise  thoracic stretch with pink foam roller hands clasped .  then  thoracic mobilization with pink foam roller horizontal.       Other Supine Exercise  pink foam roller lengthwise.  scapular stabilizers. blue  t band  with bil flex, horizontal abd, diagonals and ER with blue t band x 15 x 2     VC and TC for balance on foam roller     Neck Exercises: Prone   Plank  prone with 2 lb with bil extension 15 x 2, T bil 15 x 2 with 2 lb and Y 1lb 15 x 2    Other Prone Exercise  q ped thoracic stretch with under reach x 10 to right and left    Other Prone Exercise   q ped arms  raise x 15 , q ped legs rasie x 15 and q ped opposite arms raise and leg raise x 15      Lumbar Exercises: Supine   Other Supine Lumbar Exercises  pink foam roller toe touch 15 x 2    added to HEP     Manual Therapy   Manual Therapy  Joint mobilization;Soft tissue mobilization    Joint Mobilization  PA lateral C-2 to C-5    over strtching in all planes without pain   Soft tissue mobilization  upper trap bil and levator and rrhomboids, scalenes             PT Education - 01/10/19 1045    Education Details  added to foam roller HEP discussed community wellness opportunities    Person(s) Educated  Patient    Methods  Explanation;Demonstration;Tactile cues;Verbal cues;Handout    Comprehension  Verbalized understanding;Returned demonstration       PT Short Term Goals - 01/08/19 1032  PT SHORT TERM GOAL #1   Title  STG=LTG        PT Long Term Goals - 01/08/19 1007      PT LONG TERM GOAL #1   Title  Pt will be independent with advanced HEP    Baseline  Pt given advanced HEP and pain management techniques    Time  6    Period  Weeks    Status  On-going      PT LONG TERM GOAL #2   Title  "Demonstrate understanding of proper sitting posture, body mechanics, work ergonomics, and be more conscious of position and posture throughout the day.     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 Working on lfting 25 lb , able to do 15 lb    Time  6    Period  Weeks    Status  On-going            Plan - 01/10/19 1204    Clinical Impression Statement  Pt enters clinic with no neck pain.  Pt educated on community wellness opportunities this visit to continue after DC.  Pt making good progress toward achievement of all goals by neck visit.    Rehab Potential  Good    PT Frequency  2x / week    PT Duration  6 weeks    PT Treatment/Interventions  ADLs/Self Care Home Management;Electrical Stimulation;Cryotherapy;Iontophoresis 77m/ml Dexamethasone;Moist Heat;Ultrasound;Spinal Manipulations;Taping;Dry needling;Passive range of motion;Patient/family education;Manual techniques;Neuromuscular re-education;Therapeutic exercise;Therapeutic activities    PT Next Visit Plan  work on 25# overhead.  DC next visit.  review HEP    PT Home Exercise Plan  neck levator and upper trap, corner stretch and deep neck flexor supine scapular stabilzers green t band  foam roller exericises    Consulted and Agree with Plan of Care  Patient       Patient will benefit from skilled therapeutic intervention in order to  improve the following deficits and impairments:  Pain, Impaired UE functional use, Decreased activity tolerance, Decreased range of motion, Decreased strength, Increased fascial restricitons, Increased muscle spasms, Postural dysfunction, Improper body mechanics  Visit Diagnosis: Cervicalgia  Muscle  weakness (generalized)  Cramp and spasm  Abnormal posture     Problem List Patient Active Problem List   Diagnosis Date Noted  . 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 Excessive daytime sleepiness 03/13/2018  . Snoring 03/13/2018  . Chronic migraine without aura, with intractable migraine, so stated, with status migrainosus 08/03/2016    Voncille Lo, PT Certified Exercise Expert for the Aging Adult  01/10/19 12:08 PM Phone: [phone] Fax: [facility] [address] [place], [place], [address] Phone: [phone]   Fax:  [phone]  Name: Jennifer Sims MRN: [id number] Date of Birth: 10/30/1966

## 2019-01-15 ENCOUNTER — Ambulatory Visit: Payer: BC Managed Care – PPO | Admitting: Physical Therapy

## 2019-01-15 ENCOUNTER — Encounter: Payer: Self-pay | Admitting: Physical Therapy

## 2019-01-15 DIAGNOSIS — M542 Cervicalgia: Secondary | ICD-10-CM

## 2019-01-15 DIAGNOSIS — R293 Abnormal posture: Secondary | ICD-10-CM

## 2019-01-15 DIAGNOSIS — M6281 Muscle weakness (generalized): Secondary | ICD-10-CM

## 2019-01-15 DIAGNOSIS — R252 Cramp and spasm: Secondary | ICD-10-CM

## 2019-01-15 NOTE — Therapy (Signed)
[place], [place], [address] Phone: [phone]   Fax:  [phone]  Physical Therapy Treatment/Discharge Note  Patient Details  Name: Jennifer Sims MRN: [id number] Date of Birth: [birth year] Referring Provider (PT): Sarina Ill MD   Encounter Date: 01/15/2019  PT End of Session - 01/15/19 1156    Visit Number  8    Number of Visits  13    Date for PT Re-Evaluation  01/17/19    Authorization Type  BCBS    PT Start Time  1102    PT Stop Time  1147    PT Time Calculation (min)  45 min    Activity Tolerance  Patient tolerated treatment well    Behavior During Therapy  [facility] for tasks assessed/performed       Past Medical History:  Diagnosis Date  . Crohn's disease ([place])   . Migraine   . Osteopenia     Past Surgical History:  Procedure Laterality Date  . CHOLECYSTECTOMY  2009    There were no vitals filed for this visit.  Subjective Assessment - 01/15/19 1109    Subjective  I feel better , no pain and I am going to continue with [employer]         [facility] PT Assessment - 01/15/19 0001      Assessment   Medical Diagnosis  Acute myofascial cervical strain and migraines    Referring Provider (PT)  Sarina Ill MD      Observation/Other Assessments   Focus on Therapeutic Outcomes (FOTO)   FOTO intake 76% limitation 24% predicted 30%   from 01-08-19     AROM   Cervical Flexion  80   WNL   Cervical Extension  65    Cervical - Right Side Bend  52    Cervical - Left Side Bend  45    Cervical - Right Rotation  70    Cervical - Left Rotation  67      Strength   Right Shoulder Flexion  5/5    Right Shoulder Extension  5/5    Right Shoulder ABduction  5/5    Right Shoulder Internal Rotation  5/5    Right Shoulder External Rotation  4/5    Left Shoulder Flexion  5/5    Left Shoulder Extension  5/5    Left Shoulder ABduction  5/5    Left Shoulder Internal Rotation  4+/5    still needing stretngtheing will cont with PT Pilates   Left Shoulder External Rotation  4+/5      Palpation   Palpation comment  Pt with good tissue mobiity in  para cervical and scalenes                   [facility] Adult PT Treatment/Exercise - 01/15/19 1103      Self-Care   Other Self-Care Comments   discussed sleep hygiene and movement for pain control as well as benefit for Pilates      Neck Exercises: Supine   Capital Flexion  10 reps    Capital Flexion Limitations  chin tucks x 10     Other Supine Exercise  thoracic stretch with pink foam roller hands clasped .  then  thoracic mobilization with pink foam roller horizontal.   Modified dead bug exercise to use right UE on left knee in table top with pressing ther ex ball and extending oppositi arm and leg x 10 and then vice versa  reviewed questions   Other Supine Exercise  pink foam roller lengthwise.  scapular stabilizers. blue  t band  with bil flex, horizontal abd, diagonals and ER with blue t band x 15 x 2    reveiwed exercises and ideas for use of foam roller     Neck Exercises: Prone   Plank  prone with 2 lb with bil extension 15 x 2, T bil 15 x 2 with 2 lb and Y 1lb 15 x 2    Other Prone Exercise  q ped thoracic stretch with under reach x 10 to right and left    Other Prone Exercise   q ped arms raise x 15 , q ped legs rasie x 15 and q ped opposite arms raise and leg raise x 15       Trigger Point Dry Needling - 01/15/19 1126    Consent Given?  Yes    Muscles Treated Upper Body  Upper trapezius;Levator scapulae;Suboccipitals muscle group;Oblique capitus;Rhomboids   bil   Upper Trapezius Response  Twitch reponse elicited;Palpable increased muscle length    Oblique Capitus Response  Twitch response elicited;Palpable increased muscle length    SubOccipitals Response  Twitch response elicited;Palpable increased muscle length    Levator Scapulae Response  Twitch response elicited;Palpable increased muscle length     Rhomboids Response  Twitch response elicited;Palpable increased muscle length   marked response          PT Education - 01/15/19 1154    Education Details  reviewed  HEP with pt and discussed sleep hygiene/ pilates for after PT    Person(s) Educated  Patient    Methods  Explanation;Demonstration;Tactile cues;Verbal cues    Comprehension  Verbalized understanding       PT Short Term Goals - 01/08/19 1032      PT SHORT TERM GOAL #1   Title  STG=LTG        PT Long Term Goals - 01/15/19 1117      PT LONG TERM GOAL #1   Title  Pt will be independent with advanced HEP    Baseline  Pt given advanced HEP and pain management techniques    Time  6    Period  Weeks    Status  Achieved      PT LONG TERM GOAL #2   Title  "Demonstrate understanding of proper sitting posture, body mechanics, work ergonomics, and be more conscious of position and posture throughout the day.     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    Baseline  Pt now able to work as professor with all duties at least 2 hours at a time at computer    Time  6    Period  Weeks    Status  Achieved  (Pended)       PT LONG TERM GOAL #4   Status  Achieved      PT LONG TERM GOAL #5   Title  Pt will be able to tolerate standing for lecture at university for at least 1 hour with no pain exacerbation greater than 2/10    Baseline  Pt can lecture for an hour without difficulty    Time  6    Period  Weeks  Status  Achieved      PT LONG TERM GOAL #6   Title  Pt will be able to demonstrate lifting of UE  over head to place books /items  25# in order to organize college room without increased pain > 2/10    Baseline  Able to lift 10 x  time 25 # KB    Period  Weeks    Status  Achieved            Plan - 01/15/19 1446    Clinical Impression  Statement  Pt enters clinic without pain and with WNL AROM of cervical motion.  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  Pt has achieved all LTG and is ready for DC    Rehab Potential  Good    PT Frequency  2x / week    PT Duration  6 weeks    PT Treatment/Interventions  ADLs/Self Care Home Management;Electrical Stimulation;Cryotherapy;Iontophoresis 57m/ml Dexamethasone;Moist Heat;Ultrasound;Spinal Manipulations;Taping;Dry needling;Passive range of motion;Patient/family education;Manual techniques;Neuromuscular re-education;Therapeutic exercise;Therapeutic activities    PT Next Visit Plan  DC    PT Home Exercise Plan  neck levator and upper trap, corner stretch and deep neck flexor supine scapular stabilzers green t band  foam roller exericises    Consulted and Agree with Plan of Care  Patient       Patient will benefit from skilled therapeutic intervention in order to improve the following deficits and impairments:  Pain, Impaired UE functional use, Decreased activity tolerance, Decreased range of motion, Decreased strength, Increased fascial restricitons, Increased muscle spasms, Postural dysfunction, Improper body mechanics  Visit Diagnosis: Cervicalgia  Muscle weakness (generalized)  Cramp and spasm  Abnormal posture     Problem List Patient Active Problem List   Diagnosis Date Noted  . 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 Excessive daytime sleepiness 03/13/2018  . Snoring 03/13/2018  . Chronic migraine without aura, with intractable migraine, so stated, with status migrainosus 08/03/2016   LVoncille Lo PT Certified Exercise Expert for the Aging Adult  01/15/19 2:47 PM Phone: [phone]: 3[facility] [place] [address]Phone: [phone]  Fax:   [phone] Name: Jennifer PekarCousins-Cooper MRN: [id number] of Birth: 3March 14, 1967  PHYSICAL THERAPY DISCHARGE SUMMARY  Visits from Start of Care: 8  Current functional level related to goals / functional outcomes: As above   Remaining deficits: none   Education / Equipment: HEP and continuing with community wellness  Plan: Patient agrees to discharge.  Patient goals were met. Patient is being discharged due to meeting the stated rehab goals.  ?????    And being pleased with current level of function and pain free.  LVoncille Lo PT Certified Exercise Expert for the Aging Adult  01/15/19 2:47 PM Phone: [phone]: [phone]

## 2019-02-08 ENCOUNTER — Telehealth: Payer: Self-pay | Admitting: Neurology

## 2019-02-08 NOTE — Telephone Encounter (Signed)
Authorization request form has been sent to [facility] for the Botox via fax.

## 2019-02-11 NOTE — Telephone Encounter (Addendum)
[employer] Botox authorization [id number] (02/08/20). DW

## 2019-02-12 ENCOUNTER — Other Ambulatory Visit: Payer: Self-pay | Admitting: Neurology

## 2019-02-12 DIAGNOSIS — M1990 Unspecified osteoarthritis, unspecified site: Secondary | ICD-10-CM

## 2019-02-12 NOTE — Telephone Encounter (Signed)
I called [employer] [phone] to check status, we marked it stat. DW

## 2019-02-14 NOTE — Telephone Encounter (Signed)
I called [employer] and spoke with a representative who stated no order has even been started. I ask for a supervisor. The supervisor states that he was going to get it worked and send an e-mail marking it stat.

## 2019-02-18 NOTE — Telephone Encounter (Signed)
I called the pharmacy back and asked to speak with a supervisor. I spoke with Merleen Nicely. She was going to work on it and call me back on my direct line.

## 2019-02-18 NOTE — Telephone Encounter (Signed)
The pharmacy called the patient and stated that they have been trying to reach our office with no luck. They told the patient we did not have a current PA on file. I called the pharmacy back and informed them that we do have a PA on file effective 02/08/19-02/08/20.  I called [employer] and spoke with Tiffany to verify auth date. She confirmed it was 02/08/19. DW

## 2019-02-18 NOTE — Telephone Encounter (Signed)
I called [employer] and spoke with a representative who stated it had not been marked STAT but that they could start the process of marking it stat for me today. He could not find who I had previously spoken to that was supposed to be a Librarian, academic. I asked for a supervisor. I spoke with Ofilia Neas who stated she was going to reach out to the person working the case and get back to me.

## 2019-02-19 ENCOUNTER — Telehealth: Payer: Self-pay | Admitting: Neurology

## 2019-02-19 ENCOUNTER — Ambulatory Visit: Payer: BC Managed Care – PPO | Admitting: Neurology

## 2019-02-19 DIAGNOSIS — G43711 Chronic migraine without aura, intractable, with status migrainosus: Secondary | ICD-10-CM

## 2019-02-19 MED ORDER — UBROGEPANT 100 MG PO TABS: 100.0000 mg | ORAL_TABLET | ORAL | 6 refills | Status: DC | PRN

## 2019-02-19 NOTE — Progress Notes (Signed)
Botox- 100 units x 2 vials Lot: [id number] Expiration: 07/2021 NDC: 7981-0254-86  Bacteriostatic 0.9% Sodium Chloride- 2m total Lot: [id number]: 09/26/2019 NDC: [address]-59 Dx: [id number]/B

## 2019-02-19 NOTE — Telephone Encounter (Signed)
12 wk Botox inj °

## 2019-02-19 NOTE — Progress Notes (Signed)
Interval history 02/19/2019: Not one headache since last botox, remarkable improvement on botox. +temples and oo. No masseters. +traps and +levators. She loved the dry needling, [place] on [address]. She had rebound from imitrex, ibuprofen and other meds. She is on Emgality and botox. She has not had to use any acute management meds. She had an allergic reaction to ajovy. Patient's neck has worsened, acute cervical strain on chronic myofascial cervical pain. She did great with dry needling will refer again and she is now doing pilates and doin well.   Meds ordered this encounter  Medications  . Ubrogepant (UBRELVY) 100 MG TABS    Sig: Take 100 mg by mouth every 2 (two) hours as needed. Max twice daily.    Dispense:  10 tablet    Refill:  6    Patient has a copay card and can have the medication reardless of insurance approval. Patient ok if needs to be ordered.      Consent Form Botulism Toxin Injection For Chronic Migraine    Reviewed orally with patient, additionally signature is on file:  Botulism toxin has been approved by the Federal drug administration for treatment of chronic migraine. Botulism toxin does not cure chronic migraine and it may not be effective in some patients.  The administration of botulism toxin is accomplished by injecting a small amount of toxin into the muscles of the neck and head. Dosage must be titrated for each individual. Any benefits resulting from botulism toxin tend to wear off after 3 months with a repeat injection required if benefit is to be maintained. Injections are usually done every 3-4 months with maximum effect peak achieved by about 2 or 3 weeks. Botulism toxin is expensive and you should be sure of what costs you will incur resulting from the injection.  The side effects of botulism toxin use for chronic migraine may include:   -Transient, and usually mild, facial weakness with facial injections  -Transient, and usually mild, head or neck  weakness with head/neck injections  -Reduction or loss of forehead facial animation due to forehead muscle weakness  -Eyelid drooping  -Dry eye  -Pain at the site of injection or bruising at the site of injection  -Double vision  -Potential unknown long term risks  Contraindications: You should not have Botox if you are pregnant, nursing, allergic to albumin, have an infection, skin condition, or muscle weakness at the site of the injection, or have myasthenia gravis, Lambert-Eaton syndrome, or ALS.  It is also possible that as with any injection, there may be an allergic reaction or no effect from the medication. Reduced effectiveness after repeated injections is sometimes seen and rarely infection at the injection site may occur. All care will be taken to prevent these side effects. If therapy is given over a long time, atrophy and wasting in the muscle injected may occur. Occasionally the patient's become refractory to treatment because they develop antibodies to the toxin. In this event, therapy needs to be modified.  I have read the above information and consent to the administration of botulism toxin.    BOTOX PROCEDURE NOTE FOR MIGRAINE HEADACHE    Contraindications and precautions discussed with patient(above). Aseptic procedure was observed and patient tolerated procedure. Procedure performed by Dr. Artemio Aly  The condition has existed for more than 6 months, and pt does not have a diagnosis of ALS, Myasthenia Gravis or Lambert-Eaton Syndrome.  Risks and benefits of injections discussed and pt agrees to proceed with the procedure.  Written consent obtained  These injections are medically necessary. Pt  receives good benefits from these injections. These injections do not cause sedations or hallucinations which the oral therapies may cause.  Description of procedure:  The patient was placed in a sitting position. The standard protocol was used for Botox as follows, with 5 units  of Botox injected at each site:   -Procerus muscle, midline injection  -Corrugator muscle, bilateral injection  -Frontalis muscle, bilateral injection, with 2 sites each side, medial injection was performed in the upper one third of the frontalis muscle, in the region vertical from the medial inferior edge of the superior orbital rim. The lateral injection was again in the upper one third of the forehead vertically above the lateral limbus of the cornea, 1.5 cm lateral to the medial injection site.  - Levator Scapulae: 5 units bilaterally  -Temporalis muscle injection, 5 sites, bilaterally. The first injection was 3 cm above the tragus of the ear, second injection site was 1.5 cm to 3 cm up from the first injection site in line with the tragus of the ear. The third injection site was 1.5-3 cm forward between the first 2 injection sites. The fourth injection site was 1.5 cm posterior to the second injection site. 5th site laterally in the temporalis  muscleat the level of the outer canthus.  - Patient feels the migraines are centered around the eyes +5 units bilaterally at the outer canthus in the orbicularis occuli  -Occipitalis muscle injection, 3 sites, bilaterally. The first injection was done one half way between the occipital protuberance and the tip of the mastoid process behind the ear. The second injection site was done lateral and superior to the first, 1 fingerbreadth from the first injection. The third injection site was 1 fingerbreadth superiorly and medially from the first injection site.  -Cervical paraspinal muscle injection, 2 sites, bilateral knee first injection site was 1 cm from the midline of the cervical spine, 3 cm inferior to the lower border of the occipital protuberance. The second injection site was 1.5 cm superiorly and laterally to the first injection site.  -Trapezius muscle injection was performed at 3 sites, bilaterally. The first injection site was in the upper  trapezius muscle halfway between the inflection point of the neck, and the acromion. The second injection site was one half way between the acromion and the first injection site. The third injection was done between the first injection site and the inflection point of the neck.   Will return for repeat injection in 3 months.   A 200 unit sof Botox was used, any Botox not injected was wasted. The patient tolerated the procedure well, there were no complications of the above procedure.

## 2019-02-26 NOTE — Telephone Encounter (Signed)
I called to schedule the patient but they did not answer so I left a VM asking them to call me back.

## 2019-04-08 ENCOUNTER — Other Ambulatory Visit: Payer: Self-pay | Admitting: *Deleted

## 2019-04-08 MED ORDER — UBROGEPANT 50 MG PO TABS: 100.0000 mg | ORAL_TABLET | ORAL | 6 refills | Status: DC | PRN

## 2019-04-15 ENCOUNTER — Encounter: Payer: Self-pay | Admitting: *Deleted

## 2019-04-15 ENCOUNTER — Telehealth: Payer: Self-pay | Admitting: *Deleted

## 2019-04-15 NOTE — Telephone Encounter (Signed)
Ubrelvy 50 mg approved by [facility] from 04/15/2019 through 04/14/2020. Approval letter faxed to [facility] on [address]. Received a receipt of confirmation. mychart message sent to pt.

## 2019-04-15 NOTE — Telephone Encounter (Signed)
Completed PA for Ubrelvy 50 mg on Cover My Meds. KEY: [id number]. Awaiting determination from [facility].

## 2019-05-23 ENCOUNTER — Ambulatory Visit: Payer: BC Managed Care – PPO | Admitting: Neurology

## 2019-05-24 ENCOUNTER — Ambulatory Visit: Payer: BC Managed Care – PPO | Admitting: Neurology

## 2019-05-29 ENCOUNTER — Ambulatory Visit (INDEPENDENT_AMBULATORY_CARE_PROVIDER_SITE_OTHER): Payer: BC Managed Care – PPO | Admitting: Neurology

## 2019-05-29 ENCOUNTER — Other Ambulatory Visit: Payer: Self-pay

## 2019-05-29 VITALS — Temp 97.8°F

## 2019-05-29 DIAGNOSIS — G43711 Chronic migraine without aura, intractable, with status migrainosus: Secondary | ICD-10-CM

## 2019-05-29 NOTE — Progress Notes (Signed)
Interval history 05/29/2019: sill doing well on botox. She had 100% relief. Has had a few migraines as we are late on botox theray due to covid19.   Interval history 02/19/2019: Not one headache since last botox, remarkable improvement on botox. +temples and oo. No masseters. +traps and +levators. She loved the dry needling, [place] on [address]. She had rebound from imitrex, ibuprofen and other meds. She is on Emgality and botox. She has not had to use any acute management meds. She had an allergic reaction to ajovy. Patient's neck has worsened, acute cervical strain on chronic myofascial cervical pain. She did great with dry needling will refer again and she is now doing pilates and doin well.       Consent Form Botulism Toxin Injection For Chronic Migraine    Reviewed orally with patient, additionally signature is on file:  Botulism toxin has been approved by the Federal drug administration for treatment of chronic migraine. Botulism toxin does not cure chronic migraine and it may not be effective in some patients.  The administration of botulism toxin is accomplished by injecting a small amount of toxin into the muscles of the neck and head. Dosage must be titrated for each individual. Any benefits resulting from botulism toxin tend to wear off after 3 months with a repeat injection required if benefit is to be maintained. Injections are usually done every 3-4 months with maximum effect peak achieved by about 2 or 3 weeks. Botulism toxin is expensive and you should be sure of what costs you will incur resulting from the injection.  The side effects of botulism toxin use for chronic migraine may include:   -Transient, and usually mild, facial weakness with facial injections  -Transient, and usually mild, head or neck weakness with head/neck injections  -Reduction or loss of forehead facial animation due to forehead muscle weakness  -Eyelid drooping  -Dry eye  -Pain at the site of  injection or bruising at the site of injection  -Double vision  -Potential unknown long term risks  Contraindications: You should not have Botox if you are pregnant, nursing, allergic to albumin, have an infection, skin condition, or muscle weakness at the site of the injection, or have myasthenia gravis, Lambert-Eaton syndrome, or ALS.  It is also possible that as with any injection, there may be an allergic reaction or no effect from the medication. Reduced effectiveness after repeated injections is sometimes seen and rarely infection at the injection site may occur. All care will be taken to prevent these side effects. If therapy is given over a long time, atrophy and wasting in the muscle injected may occur. Occasionally the patient's become refractory to treatment because they develop antibodies to the toxin. In this event, therapy needs to be modified.  I have read the above information and consent to the administration of botulism toxin.    BOTOX PROCEDURE NOTE FOR MIGRAINE HEADACHE    Contraindications and precautions discussed with patient(above). Aseptic procedure was observed and patient tolerated procedure. Procedure performed by Dr. Artemio Aly  The condition has existed for more than 6 months, and pt does not have a diagnosis of ALS, Myasthenia Gravis or Lambert-Eaton Syndrome.  Risks and benefits of injections discussed and pt agrees to proceed with the procedure.  Written consent obtained  These injections are medically necessary. Pt  receives good benefits from these injections. These injections do not cause sedations or hallucinations which the oral therapies may cause.  Description of procedure:  The patient was  placed in a sitting position. The standard protocol was used for Botox as follows, with 5 units of Botox injected at each site:   -Procerus muscle, midline injection  -Corrugator muscle, bilateral injection  -Frontalis muscle, bilateral injection, with 2 sites  each side, medial injection was performed in the upper one third of the frontalis muscle, in the region vertical from the medial inferior edge of the superior orbital rim. The lateral injection was again in the upper one third of the forehead vertically above the lateral limbus of the cornea, 1.5 cm lateral to the medial injection site.  - Levator Scapulae: 5 units bilaterally  -Temporalis muscle injection, 5 sites, bilaterally. The first injection was 3 cm above the tragus of the ear, second injection site was 1.5 cm to 3 cm up from the first injection site in line with the tragus of the ear. The third injection site was 1.5-3 cm forward between the first 2 injection sites. The fourth injection site was 1.5 cm posterior to the second injection site. 5th site laterally in the temporalis  muscleat the level of the outer canthus.  - Patient feels the migraines are centered around the eyes +5 units bilaterally at the outer canthus in the orbicularis occuli  -Occipitalis muscle injection, 3 sites, bilaterally. The first injection was done one half way between the occipital protuberance and the tip of the mastoid process behind the ear. The second injection site was done lateral and superior to the first, 1 fingerbreadth from the first injection. The third injection site was 1 fingerbreadth superiorly and medially from the first injection site.  -Cervical paraspinal muscle injection, 2 sites, bilateral knee first injection site was 1 cm from the midline of the cervical spine, 3 cm inferior to the lower border of the occipital protuberance. The second injection site was 1.5 cm superiorly and laterally to the first injection site.  -Trapezius muscle injection was performed at 3 sites, bilaterally. The first injection site was in the upper trapezius muscle halfway between the inflection point of the neck, and the acromion. The second injection site was one half way between the acromion and the first injection  site. The third injection was done between the first injection site and the inflection point of the neck.   Will return for repeat injection in 3 months.   A 200 unit sof Botox was used, any Botox not injected was wasted. The patient tolerated the procedure well, there were no complications of the above procedure.

## 2019-05-29 NOTE — Progress Notes (Signed)
Botox- 100 units x 2 vials Lot: [id number] Expiration: 01/2022 NDC: 3837-7939-68  Bacteriostatic 0.9% Sodium Chloride- 35m total Lot: [id number]: 09/26/2019 NDC: [address]-83 Dx: [id number]/B

## 2019-06-05 DIAGNOSIS — Z8669 Personal history of other diseases of the nervous system and sense organs: Secondary | ICD-10-CM | POA: Insufficient documentation

## 2019-06-09 ENCOUNTER — Other Ambulatory Visit
Admission: RE | Admit: 2019-06-09 | Discharge: 2019-06-09 | Disposition: A | Discharge status: Home / Self Care | Payer: BC Managed Care – PPO | Source: Ambulatory Visit | Attending: Nurse Practitioner | Admitting: Nurse Practitioner

## 2019-06-10 ENCOUNTER — Other Ambulatory Visit
Admission: RE | Admit: 2019-06-10 | Discharge: 2019-06-10 | Disposition: A | Discharge status: Home / Self Care | Payer: BC Managed Care – PPO | Source: Ambulatory Visit | Attending: Nurse Practitioner | Admitting: Nurse Practitioner

## 2019-06-10 DIAGNOSIS — R197 Diarrhea, unspecified: Secondary | ICD-10-CM | POA: Insufficient documentation

## 2019-06-10 DIAGNOSIS — K50919 Crohn's disease, unspecified, with unspecified complications: Secondary | ICD-10-CM | POA: Insufficient documentation

## 2019-06-13 LAB — CALPROTECTIN, FECAL: Calprotectin, Fecal: <16 ug/g (ref 0–120)

## 2019-06-18 ENCOUNTER — Telehealth: Payer: Self-pay | Admitting: Neurology

## 2019-06-18 NOTE — Telephone Encounter (Signed)
I called to schedule the patient for her Botox injections but she did not answer and her VM was full.

## 2019-06-27 NOTE — Telephone Encounter (Signed)
I called and scheduled the patient for her Botox injection. DW

## 2019-07-08 ENCOUNTER — Other Ambulatory Visit: Payer: Self-pay | Admitting: *Deleted

## 2019-07-08 DIAGNOSIS — M1990 Unspecified osteoarthritis, unspecified site: Secondary | ICD-10-CM

## 2019-07-08 MED ORDER — INDOMETHACIN 25 MG PO CAPS: ORAL_CAPSULE | ORAL | 6 refills | Status: DC

## 2019-08-01 ENCOUNTER — Other Ambulatory Visit: Payer: Self-pay | Admitting: Neurology

## 2019-08-01 DIAGNOSIS — G43709 Chronic migraine without aura, not intractable, without status migrainosus: Secondary | ICD-10-CM

## 2019-08-08 DIAGNOSIS — M779 Enthesopathy, unspecified: Secondary | ICD-10-CM | POA: Insufficient documentation

## 2019-09-10 ENCOUNTER — Ambulatory Visit: Payer: BC Managed Care – PPO | Admitting: Neurology

## 2019-09-12 ENCOUNTER — Ambulatory Visit (INDEPENDENT_AMBULATORY_CARE_PROVIDER_SITE_OTHER): Payer: BC Managed Care – PPO | Admitting: Neurology

## 2019-09-12 ENCOUNTER — Other Ambulatory Visit: Payer: Self-pay

## 2019-09-12 VITALS — Temp 97.7°F

## 2019-09-12 DIAGNOSIS — G43711 Chronic migraine without aura, intractable, with status migrainosus: Secondary | ICD-10-CM

## 2019-09-12 MED ORDER — NURTEC 75 MG PO TBDP: 75.0000 mg | ORAL_TABLET | Freq: Every day | ORAL | 6 refills | Status: DC | PRN

## 2019-09-12 MED ORDER — REYVOW 100 MG PO TABS: 100.0000 mg | ORAL_TABLET | Freq: Once | ORAL | 6 refills | Status: DC | PRN

## 2019-09-12 NOTE — Progress Notes (Signed)
Botox- 200 units x 1 vial Lot: [id number] Expiration: 04/2022 NDC: 2950-6462-88  Bacteriostatic 0.9% Sodium Chloride- 19m total Lot: [id number]: 06/25/2020 NDC: [address]-96 Dx: [id number]/B

## 2019-09-12 NOTE — Progress Notes (Signed)
Interval history 09/15/2019: >95% improvement on botox, frequency migraines. Wil try the new medications, acute.  Meds ordered this encounter  Medications  . Rimegepant Sulfate (NURTEC) 75 MG TBDP    Sig: Take 75 mg by mouth daily as needed. For migraines. Take as close to onset of migraine as possible. One daily maximum.    Dispense:  10 tablet    Refill:  6    Once daily as needed for miraine  . Lasmiditan Succinate (REYVOW) 100 MG TABS    Sig: Take 100 mg by mouth once as needed for up to 1 dose. Once daily as needed for migraine. Watch for sedation    Dispense:  10 tablet    Refill:  6     Interval history 05/29/2019: sill doing well on botox. She had 100% relief. Has had a few migraines as we are late on botox theray due to covid19.   Interval history 02/19/2019: Not one headache since last botox, remarkable improvement on botox. +temples and oo. No masseters. +traps and +levators. She loved the dry needling, [place] on [address]. She had rebound from imitrex, ibuprofen and other meds. She is on Emgality and botox. She has not had to use any acute management meds. She had an allergic reaction to ajovy. Patient's neck has worsened, acute cervical strain on chronic myofascial cervical pain. She did great with dry needling will refer again and she is now doing pilates and doin well.   She had eye lower lid surgery Dr. Freddi Starr, saw .Marland Kitchen..doing well.      Consent Form Botulism Toxin Injection For Chronic Migraine    Reviewed orally with patient, additionally signature is on file:  Botulism toxin has been approved by the Federal drug administration for treatment of chronic migraine. Botulism toxin does not cure chronic migraine and it may not be effective in some patients.  The administration of botulism toxin is accomplished by injecting a small amount of toxin into the muscles of the neck and head. Dosage must be titrated for each individual. Any benefits resulting from botulism toxin  tend to wear off after 3 months with a repeat injection required if benefit is to be maintained. Injections are usually done every 3-4 months with maximum effect peak achieved by about 2 or 3 weeks. Botulism toxin is expensive and you should be sure of what costs you will incur resulting from the injection.  The side effects of botulism toxin use for chronic migraine may include:   -Transient, and usually mild, facial weakness with facial injections  -Transient, and usually mild, head or neck weakness with head/neck injections  -Reduction or loss of forehead facial animation due to forehead muscle weakness  -Eyelid drooping  -Dry eye  -Pain at the site of injection or bruising at the site of injection  -Double vision  -Potential unknown long term risks  Contraindications: You should not have Botox if you are pregnant, nursing, allergic to albumin, have an infection, skin condition, or muscle weakness at the site of the injection, or have myasthenia gravis, Lambert-Eaton syndrome, or ALS.  It is also possible that as with any injection, there may be an allergic reaction or no effect from the medication. Reduced effectiveness after repeated injections is sometimes seen and rarely infection at the injection site may occur. All care will be taken to prevent these side effects. If therapy is given over a long time, atrophy and wasting in the muscle injected may occur. Occasionally the patient's become refractory to treatment  because they develop antibodies to the toxin. In this event, therapy needs to be modified.  I have read the above information and consent to the administration of botulism toxin.    BOTOX PROCEDURE NOTE FOR MIGRAINE HEADACHE    Contraindications and precautions discussed with patient(above). Aseptic procedure was observed and patient tolerated procedure. Procedure performed by Dr. Artemio Aly  The condition has existed for more than 6 months, and pt does not have a diagnosis  of ALS, Myasthenia Gravis or Lambert-Eaton Syndrome.  Risks and benefits of injections discussed and pt agrees to proceed with the procedure.  Written consent obtained  These injections are medically necessary. Pt  receives good benefits from these injections. These injections do not cause sedations or hallucinations which the oral therapies may cause.  Description of procedure:  The patient was placed in a sitting position. The standard protocol was used for Botox as follows, with 5 units of Botox injected at each site:   -Procerus muscle, midline injection  -Corrugator muscle, bilateral injection  -Frontalis muscle, bilateral injection, with 2 sites each side, medial injection was performed in the upper one third of the frontalis muscle, in the region vertical from the medial inferior edge of the superior orbital rim. The lateral injection was again in the upper one third of the forehead vertically above the lateral limbus of the cornea, 1.5 cm lateral to the medial injection site.  - Levator Scapulae: 5 units bilaterally  -Temporalis muscle injection, 5 sites, bilaterally. The first injection was 3 cm above the tragus of the ear, second injection site was 1.5 cm to 3 cm up from the first injection site in line with the tragus of the ear. The third injection site was 1.5-3 cm forward between the first 2 injection sites. The fourth injection site was 1.5 cm posterior to the second injection site. 5th site laterally in the temporalis  muscleat the level of the outer canthus.  - Patient feels the migraines are centered around the eyes +5 units bilaterally at the outer canthus in the orbicularis occuli  -Occipitalis muscle injection, 3 sites, bilaterally. The first injection was done one half way between the occipital protuberance and the tip of the mastoid process behind the ear. The second injection site was done lateral and superior to the first, 1 fingerbreadth from the first injection. The  third injection site was 1 fingerbreadth superiorly and medially from the first injection site.  -Cervical paraspinal muscle injection, 2 sites, bilateral knee first injection site was 1 cm from the midline of the cervical spine, 3 cm inferior to the lower border of the occipital protuberance. The second injection site was 1.5 cm superiorly and laterally to the first injection site.  -Trapezius muscle injection was performed at 3 sites, bilaterally. The first injection site was in the upper trapezius muscle halfway between the inflection point of the neck, and the acromion. The second injection site was one half way between the acromion and the first injection site. The third injection was done between the first injection site and the inflection point of the neck.   Will return for repeat injection in 3 months.   A 200 unit sof Botox was used, any Botox not injected was wasted. The patient tolerated the procedure well, there were no complications of the above procedure.

## 2019-09-19 ENCOUNTER — Telehealth: Payer: Self-pay

## 2019-09-19 NOTE — Telephone Encounter (Signed)
PA approve for Reyvow 100mg  approve from 09/19/2019 to 09/18/2020. Any questions call customer care at [phone].

## 2019-09-19 NOTE — Telephone Encounter (Signed)
PA done for Reyvow in cover my meds.Your information has been submitted to [place]. To check for an updated outcome later, reopen this PA request from your dashboard. If [employer] has not responded to your request within 24 hours, contact [place] at [phone]. If you think there may be a problem with your PA request, use our live chat feature at the bottom right

## 2019-10-28 ENCOUNTER — Telehealth: Payer: Self-pay | Admitting: *Deleted

## 2019-10-28 NOTE — Telephone Encounter (Signed)
Completed Nurtec PA on CMM. [employer] - PA Case ID: [id number] - Rx #: [id number]. Awaiting [facility] determination.

## 2019-10-28 NOTE — Telephone Encounter (Signed)
Per [facility], Nurtec 75 mg approved 10/28/2019 through 10/27/2020. I faxed the approval to [facility]. Received a receipt of confirmation.

## 2019-12-24 ENCOUNTER — Other Ambulatory Visit: Payer: Self-pay | Admitting: Neurology

## 2019-12-24 ENCOUNTER — Ambulatory Visit (INDEPENDENT_AMBULATORY_CARE_PROVIDER_SITE_OTHER): Payer: BC Managed Care – PPO | Admitting: Neurology

## 2019-12-24 ENCOUNTER — Other Ambulatory Visit: Payer: Self-pay

## 2019-12-24 VITALS — Temp 97.3°F

## 2019-12-24 DIAGNOSIS — M7918 Myalgia, other site: Secondary | ICD-10-CM

## 2019-12-24 DIAGNOSIS — G43711 Chronic migraine without aura, intractable, with status migrainosus: Secondary | ICD-10-CM | POA: Diagnosis not present

## 2019-12-24 NOTE — Progress Notes (Signed)
Botox- 200 units x 1 vial Lot: [id number] Expiration: 08/2022 NDC: 5940-9050-25  Bacteriostatic 0.9% Sodium Chloride- 40m total Lot: [id number]: 03/26/2020 NDC: [address]-30 Dx: [id number]/B

## 2019-12-24 NOTE — Progress Notes (Signed)
Interval history 12/24/2019: >95% improvement on botox, frequency migraines. Wil try the new medications, acute. Jennifer Sims works best. Also Nurtec. Resend to dry needling, she loved Donnetta Simpers will reuest for cervical myofascial pain syndrome. She has 2 kids 55 and 53. 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 Hanley Seamen her a few bio freeze to try.  Interval history 05/29/2019: sill doing well on botox. She had 100% relief. Has had a few migraines as we are late on botox theray due to covid19.   Interval history 02/19/2019: Not one headache since last botox, remarkable improvement on botox. +temples and oo. No masseters. +traps and +levators. She loved the dry needling, [place] on [address]. She had rebound from imitrex, ibuprofen and other meds. She is on Emgality and botox. She has not had to use any acute management meds. She had an allergic reaction to ajovy. Patient's neck has worsened, acute cervical strain on chronic myofascial cervical pain. She did great with dry needling will refer again and she is now doing pilates and doin well.   She had eye lower lid surgery Dr. Freddi Starr, saw .Marland Kitchen..doing well.      Consent Form Botulism Toxin Injection For Chronic Migraine    Reviewed orally with patient, additionally signature is on file:  Botulism toxin has been approved by the Federal drug administration for treatment of chronic migraine. Botulism toxin does not cure chronic migraine and it may not be effective in some patients.  The administration of botulism toxin is accomplished by injecting a small amount of toxin into the muscles of the neck and head. Dosage must be titrated for each individual. Any benefits resulting from botulism toxin tend to wear off after 3 months with a repeat injection required if benefit is to be maintained. Injections are usually done every 3-4 months with maximum effect peak achieved by about 2 or 3 weeks. Botulism toxin is expensive and you should be sure of what  costs you will incur resulting from the injection.  The side effects of botulism toxin use for chronic migraine may include:   -Transient, and usually mild, facial weakness with facial injections  -Transient, and usually mild, head or neck weakness with head/neck injections  -Reduction or loss of forehead facial animation due to forehead muscle weakness  -Eyelid drooping  -Dry eye  -Pain at the site of injection or bruising at the site of injection  -Double vision  -Potential unknown long term risks  Contraindications: You should not have Botox if you are pregnant, nursing, allergic to albumin, have an infection, skin condition, or muscle weakness at the site of the injection, or have myasthenia gravis, Lambert-Eaton syndrome, or ALS.  It is also possible that as with any injection, there may be an allergic reaction or no effect from the medication. Reduced effectiveness after repeated injections is sometimes seen and rarely infection at the injection site may occur. All care will be taken to prevent these side effects. If therapy is given over a long time, atrophy and wasting in the muscle injected may occur. Occasionally the patient's become refractory to treatment because they develop antibodies to the toxin. In this event, therapy needs to be modified.  I have read the above information and consent to the administration of botulism toxin.    BOTOX PROCEDURE NOTE FOR MIGRAINE HEADACHE    Contraindications and precautions discussed with patient(above). Aseptic procedure was observed and patient tolerated procedure. Procedure performed by Dr. Georgia Dom  The condition has existed for more  than 6 months, and pt does not have a diagnosis of ALS, Myasthenia Gravis or Lambert-Eaton Syndrome.  Risks and benefits of injections discussed and pt agrees to proceed with the procedure.  Written consent obtained  These injections are medically necessary. Pt  receives good benefits from these  injections. These injections do not cause sedations or hallucinations which the oral therapies may cause.  Description of procedure:  The patient was placed in a sitting position. The standard protocol was used for Botox as follows, with 5 units of Botox injected at each site:   -Procerus muscle, midline injection  -Corrugator muscle, bilateral injection  -Frontalis muscle, bilateral injection, with 2 sites each side, medial injection was performed in the upper one third of the frontalis muscle, in the region vertical from the medial inferior edge of the superior orbital rim. The lateral injection was again in the upper one third of the forehead vertically above the lateral limbus of the cornea, 1.5 cm lateral to the medial injection site.  - Levator Scapulae: 5 units bilaterally  -Temporalis muscle injection, 5 sites, bilaterally. The first injection was 3 cm above the tragus of the ear, second injection site was 1.5 cm to 3 cm up from the first injection site in line with the tragus of the ear. The third injection site was 1.5-3 cm forward between the first 2 injection sites. The fourth injection site was 1.5 cm posterior to the second injection site. 5th site laterally in the temporalis  muscleat the level of the outer canthus.  - Patient feels the migraines are centered around the eyes +5 units bilaterally at the outer canthus in the orbicularis occuli  -Occipitalis muscle injection, 3 sites, bilaterally. The first injection was done one half way between the occipital protuberance and the tip of the mastoid process behind the ear. The second injection site was done lateral and superior to the first, 1 fingerbreadth from the first injection. The third injection site was 1 fingerbreadth superiorly and medially from the first injection site.  -Cervical paraspinal muscle injection, 2 sites, bilateral knee first injection site was 1 cm from the midline of the cervical spine, 3 cm inferior to the  lower border of the occipital protuberance. The second injection site was 1.5 cm superiorly and laterally to the first injection site.  -Trapezius muscle injection was performed at 3 sites, bilaterally. The first injection site was in the upper trapezius muscle halfway between the inflection point of the neck, and the acromion. The second injection site was one half way between the acromion and the first injection site. The third injection was done between the first injection site and the inflection point of the neck.   Will return for repeat injection in 3 months.   A 200 unit sof Botox was used, any Botox not injected was wasted. The patient tolerated the procedure well, there were no complications of the above procedure.

## 2020-01-09 ENCOUNTER — Other Ambulatory Visit: Payer: Self-pay

## 2020-01-09 ENCOUNTER — Ambulatory Visit: Payer: BC Managed Care – PPO | Attending: Neurology | Admitting: Physical Therapy

## 2020-01-09 DIAGNOSIS — R252 Cramp and spasm: Secondary | ICD-10-CM | POA: Diagnosis present

## 2020-01-09 DIAGNOSIS — M6281 Muscle weakness (generalized): Secondary | ICD-10-CM | POA: Diagnosis present

## 2020-01-09 DIAGNOSIS — R293 Abnormal posture: Secondary | ICD-10-CM | POA: Diagnosis present

## 2020-01-09 DIAGNOSIS — M542 Cervicalgia: Secondary | ICD-10-CM | POA: Insufficient documentation

## 2020-01-09 NOTE — Patient Instructions (Addendum)
Trigger Point Dry Needling  . What is Trigger Point Dry Needling (DN)? o DN is a physical therapy technique used to treat muscle pain and dysfunction. Specifically, DN helps deactivate muscle trigger points (muscle knots).  o A thin filiform needle is used to penetrate the skin and stimulate the underlying trigger point. The goal is for a local twitch response (LTR) to occur and for the trigger point to relax. No medication of any kind is injected during the procedure.   . What Does Trigger Point Dry Needling Feel Like?  o The procedure feels different for each individual patient. Some patients report that they do not actually feel the needle enter the skin and overall the process is not painful. Very mild bleeding may occur. However, many patients feel a deep cramping in the muscle in which the needle was inserted. This is the local twitch response.   Marland Kitchen How Will I feel after the treatment? o Soreness is normal, and the onset of soreness may not occur for a few hours. Typically this soreness does not last longer than two days.  o Bruising is uncommon, however; ice can be used to decrease any possible bruising.  o In rare cases feeling tired or nauseous after the treatment is normal. In addition, your symptoms may get worse before they get better, this period will typically not last longer than 24 hours.   . What Can I do After My Treatment? o Increase your hydration by drinking more water for the next 24 hours. o You may place ice or heat on the areas treated that have become sore, however, do not use heat on inflamed or bruised areas. Heat often brings more relief post needling. o You can continue your regular activities, but vigorous activity is not recommended initially after the treatment for 24 hours. o DN is best combined with other physical therapy such as strengthening, stretching, and other therapies.    Levator Stretch   Grasp seat or sit on hand on side to be stretched. Turn head  toward other side and look down. Use hand on head to gently stretch neck in that position. Hold _20-30___ seconds. Repeat on other side. Repeat _2-3___ times. Do _2-3___ sessions per day.  http://gt2.exer.us/30   Copyright  VHI. All rights reserved.  Side-Bending   One hand on opposite side of head, pull head to side as far as is comfortable. Stop if there is pain. Hold __20-30__ seconds. Repeat with other hand to other side. Repeat _3___ times. Do 2-3____ sessions per day.   Copyright  VHI. All rights reserved.  Scapular Retraction (Standing)   With arms at sides, pinch shoulder blades together. Repeat __10__ times per set. Do __1__ sets per session. Do _2-3___ sessions per day.  http://orth.exer.us/944   Copyright  VHI. All rights reserved.  Chin Protraction / Retraction   Slide head forward keeping chin level. Slide head back, pulling chin in. Hold each position _5__ seconds. Repeat 10___ times. Do __2-3_ sessions per day.  Copyright  VHI. All rights reserved.   Jennifer Sims, PT Certified Exercise Expert for the Aging Adult  01/09/20 9:57 AM Phone: [phone] Fax: [phone]

## 2020-01-09 NOTE — Therapy (Signed)
[place], [place], [address] Phone: [phone]   Fax:  [phone]  Physical Therapy Evaluation  Patient Details  Name: Jennifer Sims Cousins-Cooper MRN: [id number] Date of Birth: 1966/06/29 Referring Provider (PT): Sarina Ill MD   Encounter Date: 01/09/2020  PT End of Session - 01/09/20 1011    Visit Number  1    Number of Visits  9    Date for PT Re-Evaluation  03/05/20    Authorization Type  [employer]    PT Start Time  0930    PT Stop Time  1024    PT Time Calculation (min)  54 min    Activity Tolerance  Patient tolerated treatment well    Behavior During Therapy  [facility] for tasks assessed/performed       Past Medical History:  Diagnosis Date  . Crohn's disease ([place])   . Migraine   . Osteopenia     Past Surgical History:  Procedure Laterality Date  . CHOLECYSTECTOMY  2009    There were no vitals filed for this visit.   Subjective Assessment - 01/09/20 1630    Subjective  I have had increaseing neck pain on both my upper traps and neck in past 2 months. Iknow my posture is not what I need it to be. I know I get relief from TPDN and I would like to get treated and continue strengthening    Limitations  Sitting    How long can you sit comfortably?  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  Yes    Pain Score  4     Pain Location  Neck    Pain Orientation  Right;Left    Pain Descriptors / Indicators  Dull;Aching;Tightness    Pain Type  Chronic pain    Pain Onset  More than a month ago    Pain Frequency  Intermittent    Aggravating Factors   working at desk,  Education administrator as a professor    Pain Relieving Factors  TPDN         [facility] PT Assessment - 01/09/20 0001      Assessment    Medical Diagnosis  Cervical Myofascial Syndrome Neck Pain R>L    Referring Provider (PT)  Sarina Ill MD    Onset Date/Surgical Date  12/30/19   has had neck pain > 30 years  recent exacerbation   Hand Dominance  Right    Next MD Visit  in 3 months    Prior Therapy  Yes for neck  same clinic/PT      Precautions   Precautions  None      Restrictions   Weight Bearing Restrictions  No      Balance Screen   Has the patient fallen in the past 6 months  No    Has the patient had a decrease in activity level because of a fear of falling?   No      Home Social worker  Private residence    Living Arrangements  Spouse/significant other;Children    Type of [place] to enter    Entrance Stairs-Number of Steps  4  Entrance Stairs-Rails  Right    Home Layout  Multi-level    Alternate Level Stairs-Number of Steps  14      Prior Function   Level of Independence  Independent    Vocation  Full time employment    Vocation Requirements  lawyer/professor      Cognition   Overall Cognitive Status  Within Functional Limits for tasks assessed      Observation/Other Assessments   Focus on Therapeutic Outcomes (FOTO)   FOTO intake 69%, limtation 31% predicted 19%      Sensation   Light Touch  Appears Intact      Posture/Postural Control   Posture/Postural Control  Postural limitations    Postural Limitations  Forward head;Rounded Shoulders;Anterior pelvic tilt   slight forward head     ROM / Strength   AROM / PROM / Strength  AROM;Strength      AROM   Overall AROM   Deficits    Right Shoulder Flexion  155 Degrees   PROM 167   Left Shoulder Flexion  150 Degrees   169   Cervical Flexion  55    Cervical Extension  40    Cervical - Right Side Bend  54    Cervical - Left Side Bend  43    Cervical - Right Rotation  65    Cervical - Left Rotation  65      Strength   Overall Strength  Deficits    Overall Strength Comments  supine  deep flex lift 1 inch from supine  for 20 sec  normal is 35 sec    Right Shoulder Flexion  4+/5    Right Shoulder ABduction  4+/5    Left Shoulder Flexion  4+/5    Left Shoulder ABduction  4+/5      Palpation   Palpation comment  tightness over bil upper trap/levator, cervical paraspinals and upper thoracic                Objective measurements completed on examination: See above findings.      [facility] PT Treatment/Exercise - 01/09/20 0001      Modalities   Modalities  Moist Heat      Moist Heat Therapy   Number Minutes Moist Heat  12 Minutes    Moist Heat Location  Cervical   upper thoracic     Manual Therapy   Manual Therapy  Soft tissue mobilization    Manual therapy comments  skilled PT with TPDN    Soft tissue mobilization  bil upper trap and levator      Neck Exercises: Stretches   Upper Trapezius Stretch  Left;Right;2 reps;20 seconds    Upper Trapezius Stretch Limitations  VC and TC    Levator Stretch  Left;Right;2 reps;20 seconds    Levator Stretch Limitations  VC and TC    Other Neck Stretches  neck retraction 10 x and shoulder rolls x 10       Trigger Point Dry Needling - 01/09/20 0001    Consent Given?  Yes    Education Handout Provided  Yes    Muscles Treated Head and Neck  Upper trapezius;Levator scapulae    Dry Needling Comments  40 mm .30 gage    Upper Trapezius Response  Twitch reponse elicited;Palpable increased muscle length   RT more resistant   Levator Scapulae Response  Twitch response elicited;Palpable increased muscle length           PT Education - 01/09/20 0957  Education Details  POC  Explanation of findings education on TPDN aftercare and precautians and Initial HEP    Person(s) Educated  Patient    Methods  Explanation;Demonstration;Tactile cues;Verbal cues;Handout    Comprehension  Verbalized understanding;Returned demonstration       PT Short Term Goals - 01/09/20 1707      PT SHORT TERM GOAL #1   Title  Pt  will be independent with initial HEP    Time  3    Period  Weeks    Status  New    Target Date  01/30/20      PT SHORT TERM GOAL #2   Title  Pt will be educated on Work Station set up    Time  3    Period  Weeks    Status  New        PT Long Term Goals - 01/09/20 1707      PT LONG TERM GOAL #1   Title  Pt will be independent with advanced HEP    Time  8    Period  Weeks    Status  New    Target Date  03/05/20      PT LONG TERM GOAL #2   Title  "Demonstrate understanding of proper sitting posture, body mechanics, work ergonomics, and be more conscious of position and posture throughout the day.     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    Baseline  31% limitation eval 01-09-20    Time  8    Period  Weeks    Status  New    Target Date  03/05/20      PT LONG TERM GOAL #5   Title  Pt will be able to tolerate standing for lecture at university for at least 1 hour with no pain exacerbation greater than 2/10    Baseline  30 min with neck irritation    Time  8    Period  Weeks    Status  New    Target Date  03/05/20             Plan - 01/09/20 1235    Clinical Impression Statement  Mrs Cousins-Cooper presents with increasing cervical tightness and and decreased muscle strength contributing to pain to 4/10 due to posture and prolonged seating/standing as a professor.  Pt is well known to the cliinic and clinician. Her cervical AROM is WFL but would benefit from functional strengthening and TPDN 1 x  a week for the next 8 week to return to PLOF with minimal pain.    Personal Factors and Comorbidities  Profession    Examination-Activity Limitations  Sit    Stability/Clinical Decision Making  Stable/Uncomplicated    Clinical Decision Making  Low  Rehab Potential  Good    PT Frequency  1x / week    PT Duration  8 weeks    PT Treatment/Interventions  Electrical Stimulation;Iontophoresis 40m/ml Dexamethasone;Moist Heat;Traction;Ultrasound;Therapeutic exercise;Neuromuscular re-education;Patient/family education;Manual techniques;Dry needling;Passive range of motion;Taping;Spinal Manipulations;Joint Manipulations    PT Next Visit Plan  Assess TPDN  progress HEP deep neck flexors    PT Home Exercise Plan  neck stretches UT, Levator    Consulted and Agree with Plan of Care  Patient       Patient will benefit from skilled therapeutic intervention in order to improve the following deficits and impairments:  Decreased activity tolerance, Increased fascial restricitons, Increased muscle spasms, Decreased strength  Visit Diagnosis: Cervicalgia  Muscle weakness (generalized)  Cramp and spasm  Abnormal posture     Problem List Patient Active Problem List   Diagnosis Date Noted  . 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 Excessive daytime sleepiness 03/13/2018  . Snoring 03/13/2018  . Chronic migraine without aura, with intractable migraine, so stated, with status migrainosus 08/03/2016    LVoncille Lo PT Certified Exercise Expert for the Aging Adult  01/09/20 5:19 PM Phone: [phone]: 3[facility] [address]Phone: [phone]  Fax:  [phone] Name: KFleeta KundeCousins-Cooper MRN: [id number] of Birth: [birth year]

## 2020-01-14 ENCOUNTER — Ambulatory Visit: Payer: BC Managed Care – PPO | Admitting: Physical Therapy

## 2020-01-21 ENCOUNTER — Other Ambulatory Visit: Payer: Self-pay

## 2020-01-21 ENCOUNTER — Ambulatory Visit: Payer: BC Managed Care – PPO | Admitting: Physical Therapy

## 2020-01-21 DIAGNOSIS — R293 Abnormal posture: Secondary | ICD-10-CM

## 2020-01-21 DIAGNOSIS — M6281 Muscle weakness (generalized): Secondary | ICD-10-CM

## 2020-01-21 DIAGNOSIS — M542 Cervicalgia: Secondary | ICD-10-CM | POA: Diagnosis not present

## 2020-01-21 DIAGNOSIS — R252 Cramp and spasm: Secondary | ICD-10-CM

## 2020-01-21 NOTE — Therapy (Signed)
[place], [place], [address] Phone: [phone]   Fax:  [phone]  Physical Therapy Treatment  Patient Details  Name: Deeandra Jerry Cousins-Cooper MRN: [id number] Date of Birth: [birth year] Referring Provider (PT): Sarina Ill MD   Encounter Date: 01/21/2020  PT End of Session - 01/21/20 1501    Visit Number  2    Number of Visits  9    Date for PT Re-Evaluation  03/05/20    Authorization Type  BCBS    PT Start Time  1500    PT Stop Time  1556    PT Time Calculation (min)  56 min    Activity Tolerance  Patient tolerated treatment well    Behavior During Therapy  [facility] for tasks assessed/performed       Past Medical History:  Diagnosis Date  . Crohn's disease ([place])   . Migraine   . Osteopenia     Past Surgical History:  Procedure Laterality Date  . CHOLECYSTECTOMY  2009    There were no vitals filed for this visit.  Subjective Assessment - 01/21/20 1512    Subjective  I have about 3/10 pain   I am glad we are working on strength and pain relief    Limitations  Sitting    How long can you sit comfortably?  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  Yes    Pain Score  3     Pain Location  Neck    Pain Orientation  Right;Left    Pain Descriptors / Indicators  Dull;Aching;Tightness    Pain Type  Chronic pain    Pain Onset  More than a month ago    Pain Frequency  Intermittent                       [facility] Adult PT Treatment/Exercise - 01/21/20 0001      Shoulder Exercises: Standing   Other Standing Exercises  shld scaption 5lb in RT /LT each x 15, bil abduction 15 x with 5lb wt in each hand , OH RT with 10lb KB bell 5 x  then 10x      Modalities   Modalities  Moist  Heat      Moist Heat Therapy   Number Minutes Moist Heat  15 Minutes    Moist Heat Location  Cervical   Thoracic     Manual Therapy   Manual Therapy  Soft tissue mobilization    Manual therapy comments  skilled PT with TPDN    Joint Mobilization  C-3to 5 UPA , PA glide    Soft tissue mobilization  bil upper trap and levator      Neck Exercises: Stretches   Upper Trapezius Stretch  Left;Right;2 reps;20 seconds    Upper Trapezius Stretch Limitations  VC and TC    Levator Stretch  Left;Right;2 reps;20 seconds    Levator Stretch Limitations  VC and TC       Trigger Point Dry Needling - 01/21/20 0001    Consent Given?  Yes    Education Handout Provided  Previously provided    Muscles Treated Head and Neck  Upper trapezius;Levator scapulae;Cervical multifidi   bil  Dry Needling Comments  40 mm .30 gage    Upper Trapezius Response  Twitch reponse elicited;Palpable increased muscle length    Levator Scapulae Response  Twitch response elicited;Palpable increased muscle length    Cervical multifidi Response  Twitch reponse elicited;Palpable increased muscle length   C-3 to C-5            PT Short Term Goals - 01/09/20 1707      PT SHORT TERM GOAL #1   Title  Pt will be independent with initial HEP    Time  3    Period  Weeks    Status  New    Target Date  01/30/20      PT SHORT TERM GOAL #2   Title  Pt will be educated on Work Station set up    Time  3    Period  Weeks    Status  New        PT Long Term Goals - 01/09/20 1707      PT LONG TERM GOAL #1   Title  Pt will be independent with advanced HEP    Time  8    Period  Weeks    Status  New    Target Date  03/05/20      PT LONG TERM GOAL #2   Title  "Demonstrate understanding of proper sitting posture, body mechanics, work ergonomics, and be more conscious of position and posture throughout the day.     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    Baseline  31% limitation eval 01-09-20    Time  8    Period  Weeks    Status  New    Target Date  03/05/20      PT LONG TERM GOAL #5   Title  Pt will be able to tolerate standing for lecture at [employer] for at least 1 hour with no pain exacerbation greater than 2/10    Baseline  30 min with neck irritation    Time  8    Period  Weeks    Status  New    Target Date  03/05/20            Plan - 01/21/20 1701    Clinical Impression Statement  Pt returns with 3/10 today and pointing to painful areas along neck bil.  RT > LT.  Pt tested on 1 RM for weight lifting with overhead  lifts and sees need to strengthen overhead and neck musculature with increasing load capacity    Personal Factors and Comorbidities  Profession    Examination-Activity Limitations  Sit    Stability/Clinical Decision Making  Stable/Uncomplicated    Rehab Potential  Good    PT Frequency  1x / week  PT Duration  8 weeks    PT Treatment/Interventions  Electrical Stimulation;Iontophoresis 72m/ml Dexamethasone;Moist Heat;Traction;Ultrasound;Therapeutic exercise;Neuromuscular re-education;Patient/family education;Manual techniques;Dry needling;Passive range of motion;Taping;Spinal Manipulations;Joint Manipulations    PT Next Visit Plan  work on strength and then TPDN as needed    PT Home Exercise Plan  neck stretches UT, Levator    Consulted and Agree with Plan of Care  Patient       Patient will benefit from skilled therapeutic intervention in order to improve the following deficits and impairments:   Decreased activity tolerance, Increased fascial restricitons, Increased muscle spasms, Decreased strength  Visit Diagnosis: Cervicalgia  Muscle weakness (generalized)  Cramp and spasm  Abnormal posture     Problem List Patient Active Problem List   Diagnosis Date Noted  . 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 Excessive daytime sleepiness 03/13/2018  . Snoring 03/13/2018  . Chronic migraine without aura, with intractable migraine, so stated, with status migrainosus 08/03/2016    LVoncille Lo PT Certified Exercise Expert for the Aging Adult  01/21/20 5:05 PM Phone: [phone]: 3[facility] [address]Phone: [phone]  Fax:  [phone] Name: KHuldah MarinCousins-Cooper MRN: [id number] of Birth: 3July 17, 1967

## 2020-01-28 ENCOUNTER — Encounter: Payer: Self-pay | Admitting: Physical Therapy

## 2020-01-28 ENCOUNTER — Ambulatory Visit: Payer: BC Managed Care – PPO | Attending: Neurology | Admitting: Physical Therapy

## 2020-01-28 ENCOUNTER — Other Ambulatory Visit: Payer: Self-pay

## 2020-01-28 DIAGNOSIS — R252 Cramp and spasm: Secondary | ICD-10-CM | POA: Diagnosis present

## 2020-01-28 DIAGNOSIS — M542 Cervicalgia: Secondary | ICD-10-CM

## 2020-01-28 DIAGNOSIS — R293 Abnormal posture: Secondary | ICD-10-CM | POA: Insufficient documentation

## 2020-01-28 DIAGNOSIS — M6281 Muscle weakness (generalized): Secondary | ICD-10-CM | POA: Insufficient documentation

## 2020-01-28 NOTE — Patient Instructions (Signed)
      Voncille Lo, PT Certified Exercise Expert for the Aging Adult  01/28/20 8:23 AM Phone: [phone] Fax: [phone]

## 2020-01-28 NOTE — Therapy (Signed)
[place], [place], [address] Phone: [phone]   Fax:  [phone]  Physical Therapy Treatment  Patient Details  Name: Shaylene Paganelli Cousins-Cooper MRN: [id number] Date of Birth: 1966-12-04 Referring Provider (PT): Sarina Ill MD   Encounter Date: 01/28/2020  PT End of Session - 01/28/20 0850    Visit Number  3    Number of Visits  9    Date for PT Re-Evaluation  03/05/20    Authorization Type  BCBS    PT Start Time  0800    PT Stop Time  0900    PT Time Calculation (min)  60 min    Activity Tolerance  Patient tolerated treatment well    Behavior During Therapy  [facility] for tasks assessed/performed       Past Medical History:  Diagnosis Date  . Crohn's disease ([place])   . Migraine   . Osteopenia     Past Surgical History:  Procedure Laterality Date  . CHOLECYSTECTOMY  2009    There were no vitals filed for this visit.  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  I get caught up in my work and dont like to take breaks    How long can you sit comfortably?  pain after 90 minutes  ( but has a 2 and 1/2 hour class    Patient Stated Goals  Strengthen neck , assess work station and decrease pain    Currently in Pain?  Yes    Pain Score  3     Pain Location  Neck    Pain Orientation  Right;Left    Pain Descriptors / Indicators  Tightness;Dull    Pain Type  Chronic pain    Pain Radiating Towards  localized    Pain Onset  More than a month ago    Pain Frequency  Intermittent    Aggravating Factors   working at desk                       [facility] Adult PT Treatment/Exercise - 01/28/20 0001      Shoulder Exercises: Standing   Other Standing Exercises  serratus with red t band bil into flexion on foam roller on wall 3 x 10 , hip hinge education and 30 time with dowel and 30 times on wall,  deadlift  iwht 25 # KB 5 x 3       Modalities   Modalities  Moist Heat      Moist Heat Therapy   Number Minutes Moist Heat  15 Minutes    Moist Heat Location  Cervical   upper thoracic     Manual Therapy   Manual Therapy  Soft tissue mobilization    Manual therapy comments  skilled PT with TPDN    Joint Mobilization  C-3to 5 UPA , PA glide    Soft tissue mobilization  bil upper trap and levator and rhomboids , sub scap       Trigger Point Dry Needling - 01/28/20 0001    Consent Given?  Yes    Education Handout Provided  Previously provided    Muscles Treated Head and Neck  Upper trapezius;Levator scapulae;Splenius capitus;Cervical multifidi;Suboccipitals    Muscles Treated Upper Quadrant  Rhomboids;Subscapularis    Dry Needling Comments  40 mm .30 gage    Upper Trapezius Response  Twitch reponse elicited;Palpable increased muscle length   bil   Suboccipitals Response  Twitch response elicited;Palpable increased muscle length   bil   Levator Scapulae Response  Twitch response elicited;Palpable increased muscle length   bil   Splenius capitus Response  Palpable increased muscle length   bil   Cervical multifidi Response  Palpable increased muscle length   bil   Rhomboids Response  Twitch response elicited;Palpable increased muscle length   RT only   Subscapularis Response  Twitch response elicited;Palpable increased muscle length   RT only          PT Education - 01/28/20 8768    Education Details  added full body strength to HEP deadlift. hip hinge and serratus    Person(s) Educated  Patient    Methods  Explanation;Demonstration;Tactile cues;Verbal cues;Handout    Comprehension  Verbalized understanding;Returned demonstration       PT Short Term Goals - 01/28/20 0824      PT SHORT TERM GOAL #1   Title  Pt will be independent with initial HEP    Baseline  moving on to advanced HEP    Time  3    Period  Weeks    Status  Achieved    Target Date  01/30/20      PT SHORT  TERM GOAL #2   Title  Pt will be educated on Work Station set up    Baseline  has information and can continue asking questions as needs arise    Time  3    Period  Weeks    Status  Achieved    Target Date  01/30/20        PT Long Term Goals - 01/09/20 1707      PT LONG TERM GOAL #1   Title  Pt will be independent with advanced HEP    Time  8    Period  Weeks    Status  New    Target Date  03/05/20      PT LONG TERM GOAL #2   Title  "Demonstrate understanding of proper sitting posture, body mechanics, work ergonomics, and be more conscious of position and posture throughout the day.     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    Baseline  31% limitation eval 01-09-20    Time  8    Period  Weeks    Status  New    Target Date  03/05/20      PT LONG TERM GOAL #5   Title  Pt will be able to tolerate standing for lecture at [employer] for at least 1 hour with no pain exacerbation greater than 2/10    Baseline  30 min with neck irritation    Time  8  Period  Weeks    Status  New    Target Date  03/05/20            Plan - 01/28/20 0846    Clinical Impression Statement  Pt returns with 3/10 pain and consents to TPDN and is closely monitored throughout session.  Initially goals reviewed with All STGs achieved  intial HEP independent and no questions regarding  work station.   Pt moving on to whole body exericise and guidance for neck postion and  strength    Personal Factors and Comorbidities   Profession    Examination-Activity Limitations  Sit    Stability/Clinical Decision Making  Stable/Uncomplicated    Clinical Decision Making  Low    Rehab Potential  Good    PT Frequency  1x / week    PT Duration  8 weeks    PT Treatment/Interventions  Electrical Stimulation;Iontophoresis 56m/ml Dexamethasone;Moist Heat;Traction;Ultrasound;Therapeutic exercise;Neuromuscular re-education;Patient/family education;Manual techniques;Dry needling;Passive range of motion;Taping;Spinal Manipulations;Joint Manipulations    PT Next Visit Plan  work on strength and then TPDN as needed    PT Home Exercise Plan  neck stretches UT, Levator Dead lift and hip hinge and serratus    Consulted and Agree with Plan of Care  Patient       Patient will benefit from skilled therapeutic intervention in order to improve the following deficits and impairments:  Decreased activity tolerance, Increased fascial restricitons, Increased muscle spasms, Decreased strength  Visit Diagnosis: Cervicalgia  Muscle weakness (generalized)  Cramp and spasm  Abnormal posture     Problem List Patient Active Problem List   Diagnosis Date Noted  . 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 Excessive daytime sleepiness 03/13/2018  . Snoring 03/13/2018  . Chronic migraine without aura, with intractable migraine, so stated, with status migrainosus 08/03/2016    LVoncille Lo PT Certified Exercise Expert for the Aging Adult  01/28/20 9:43 AM Phone: [phone]: 3[facility]GStrat[address] [place] [address]Phone: [phone]  Fax:  [phone] Name: KMckaylee DimalantaCousins-Cooper MRN: [id number] of Birth: [birth year]

## 2020-02-04 ENCOUNTER — Encounter: Payer: Self-pay | Admitting: Physical Therapy

## 2020-02-04 ENCOUNTER — Other Ambulatory Visit: Payer: Self-pay

## 2020-02-04 ENCOUNTER — Ambulatory Visit: Payer: BC Managed Care – PPO | Admitting: Physical Therapy

## 2020-02-04 DIAGNOSIS — R252 Cramp and spasm: Secondary | ICD-10-CM

## 2020-02-04 DIAGNOSIS — M542 Cervicalgia: Secondary | ICD-10-CM | POA: Diagnosis not present

## 2020-02-04 DIAGNOSIS — R293 Abnormal posture: Secondary | ICD-10-CM

## 2020-02-04 DIAGNOSIS — M6281 Muscle weakness (generalized): Secondary | ICD-10-CM

## 2020-02-04 NOTE — Therapy (Signed)
[place], [place], [address] Phone: [phone]   Fax:  [phone]  Physical Therapy Treatment  Patient Details  Name: [place] MRN: [id number] Date of Birth: 1966/01/13 Referring Provider (PT): Sarina Ill MD   Encounter Date: 02/04/2020  PT End of Session - 02/04/20 0849    Visit Number  4    Number of Visits  9    Date for PT Re-Evaluation  03/05/20    Authorization Type  BCBS    PT Start Time  0846    PT Stop Time  0943    PT Time Calculation (min)  57 min    Activity Tolerance  Patient tolerated treatment well    Behavior During Therapy  [facility] for tasks assessed/performed       Past Medical History:  Diagnosis Date  . Crohn's disease ([place])   . Migraine   . Osteopenia     Past Surgical History:  Procedure Laterality Date  . CHOLECYSTECTOMY  2009    There were no vitals filed for this visit.  Subjective Assessment - 02/04/20 0853    Subjective  I am 3/10 today mostly tight and discomfort mostly    Limitations  Sitting    Currently in Pain?  Yes    Pain Score  3     Pain Location  Neck    Pain Orientation  Right;Left    Pain Descriptors / Indicators  Discomfort;Tightness    Pain Type  Chronic pain    Pain Onset  More than a month ago    Pain Frequency  Intermittent                       [facility] Adult PT Treatment/Exercise - 02/04/20 0001      Shoulder Exercises: Standing   Other Standing Exercises  5 # DB with abd 1 round 10 and fatigue at 4 on 2nd round,   , scaption 3 x 10 and fatiguing    Other Standing Exercises  hip hinge with Overhead press with 15 # KB  VC and TC, KB ladder with 15#, 25# and 30# 5 x each and 3 rounds      Modalities   Modalities  Moist Heat      Moist Heat Therapy   Number Minutes Moist Heat  15 Minutes    Moist Heat Location  Cervical   upper thoracic     Manual Therapy   Manual Therapy  Soft tissue mobilization     Manual therapy comments  skilled PT with TPDN    Joint Mobilization  C-3to 5 UPA , PA glide, lateral glide bil grade 3     Soft tissue mobilization  bil upper trap and levator and rhomboids , sub scap       Trigger Point Dry Needling - 02/04/20 0001    Consent Given?  Yes    Education Handout Provided  Previously provided    Muscles Treated Head and Neck  Upper trapezius;Levator scapulae;Splenius capitus;Cervical multifidi;Suboccipitals   bil all TPDN   Muscles Treated Upper Quadrant  Rhomboids;Subscapularis   mostly RT   Dry Needling Comments  40 mm .30 gage    Upper Trapezius Response  Twitch reponse elicited;Palpable increased muscle length    Suboccipitals Response  Twitch response elicited;Palpable increased muscle length    Levator Scapulae Response  Twitch response elicited;Palpable increased muscle length    Splenius capitus Response  Twitch reponse elicited;Palpable increased muscle  length    Cervical multifidi Response  Twitch reponse elicited;Palpable increased muscle length   C2- C-5 bil bil sub occiptial twitch respoonse RT   Rhomboids Response  Twitch response elicited;Palpable increased muscle length    Subscapularis Response  Twitch response elicited;Palpable increased muscle length           PT Education - 02/04/20 1048    Education Details  worked on [employer] and full body exericises with emphasis on neck position    Person(s) Educated  Patient    Methods  Explanation;Demonstration    Comprehension  Verbalized understanding;Returned demonstration       PT Short Term Goals - 01/28/20 0824      PT SHORT TERM GOAL #1   Title  Pt will be independent with initial HEP    Baseline  moving on to advanced HEP    Time  3    Period  Weeks    Status  Achieved    Target Date  01/30/20      PT SHORT TERM GOAL #2   Title  Pt will be educated on Work Station set up    Baseline  has information and can continue asking questions as needs arise    Time  3    Period   Weeks    Status  Achieved    Target Date  01/30/20        PT Long Term Goals - 02/04/20 0932      PT LONG TERM GOAL #1   Title  Pt will be independent with advanced HEP    Baseline  Pt given advanced HEP and pain management techniques managing 5 lb DB with fatigue,  KB #25    Time  8    Period  Weeks    Status  On-going      PT LONG TERM GOAL #2   Title  "Demonstrate understanding of proper sitting posture, body mechanics, work ergonomics, and be more conscious of position and posture throughout the day.     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  Pt improving in weight tolerance but fatigues with arm abduction after 14 reps  with 5 lb.  Will continue to improve functional strength for tasks to improve base for neck musculature at remaining visits.  Pt consents to TPDN and is closely monitored throughout process. Will continue POC    Personal Factors and Comorbidities  Profession    Examination-Activity Limitations  Sit    Clinical Decision Making  Low    Rehab Potential  Good    PT Frequency  1x / week     PT Duration  8 weeks    PT Treatment/Interventions  Electrical Stimulation;Iontophoresis 16m/ml Dexamethasone;Moist Heat;Traction;Ultrasound;Therapeutic exercise;Neuromuscular re-education;Patient/family education;Manual techniques;Dry needling;Passive range of motion;Taping;Spinal Manipulations;Joint Manipulations    PT Next Visit Plan  FOTO, increase to 45# KB  farmers carry add to HEP    PT Home Exercise Plan  neck stretches UT, Levator Dead lift and hip hinge and serratus    Consulted and Agree with Plan of Care  Patient       Patient will benefit from skilled therapeutic intervention in order to improve the following deficits and impairments:  Decreased activity tolerance, Increased fascial restricitons, Increased muscle spasms, Decreased strength  Visit Diagnosis: Cervicalgia  Muscle weakness (generalized)  Cramp and spasm  Abnormal posture     Problem List Patient Active Problem List   Diagnosis Date Noted  . 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 Excessive daytime sleepiness 03/13/2018  . Snoring 03/13/2018  . Chronic migraine without aura, with intractable migraine, so stated, with status migrainosus 08/03/2016    LVoncille Lo PT Certified Exercise Expert for the Aging Adult  02/04/20 10:49 AM Phone: [phone]: 3[place] [address]Phone: [phone]  Fax:  [phone] Name: KZiara ThelanderCousins-Cooper MRN: [id number] of Birth: 3June 21, 1967

## 2020-02-11 ENCOUNTER — Other Ambulatory Visit: Payer: Self-pay

## 2020-02-11 ENCOUNTER — Encounter: Payer: Self-pay | Admitting: Physical Therapy

## 2020-02-11 ENCOUNTER — Ambulatory Visit: Payer: BC Managed Care – PPO | Admitting: Physical Therapy

## 2020-02-11 DIAGNOSIS — M542 Cervicalgia: Secondary | ICD-10-CM

## 2020-02-11 DIAGNOSIS — R252 Cramp and spasm: Secondary | ICD-10-CM

## 2020-02-11 DIAGNOSIS — M6281 Muscle weakness (generalized): Secondary | ICD-10-CM

## 2020-02-11 DIAGNOSIS — R293 Abnormal posture: Secondary | ICD-10-CM

## 2020-02-11 NOTE — Therapy (Signed)
[place], [place], [address] Phone: [phone]   Fax:  [phone]  Physical Therapy Treatment  Patient Details  Name: Jennifer Sims MRN: [id number] Date of Birth: 1966/10/03 Referring Provider (PT): Sarina Ill MD   Encounter Date: 02/11/2020  PT End of Session - 02/11/20 0816    Visit Number  5    Number of Visits  9    Date for PT Re-Evaluation  03/05/20    Authorization Type  BCBS    PT Start Time  0807    PT Stop Time  0857    PT Time Calculation (min)  50 min    Activity Tolerance  Patient tolerated treatment well    Behavior During Therapy  [facility] for tasks assessed/performed       Past Medical History:  Diagnosis Date  . Crohn's disease ([place])   . Migraine   . Osteopenia     Past Surgical History:  Procedure Laterality Date  . CHOLECYSTECTOMY  2009    There were no vitals filed for this visit.  Subjective Assessment - 02/11/20 0816    Subjective  today I am a 4/10 pain working on my sons applications for [employer].  my neck hurt more this weekend    Limitations  Sitting    How long can you sit comfortably?  able to perform well during 2 and  1/2 hours.  sometimes pain afterward    Patient Stated Goals  Strengthen neck , assess work station and decrease pain    Currently in Pain?  Yes    Pain Score  4     Pain Location  Neck    Pain Orientation  Right;Left    Pain Descriptors / Indicators  Discomfort;Tightness    Pain Type  Chronic pain    Pain Onset  More than a month ago         [facility] PT Assessment - 02/11/20 0001      Observation/Other Assessments   Focus on Therapeutic Outcomes (FOTO)   FOTO intake 68%, limtation 32% predicted 19%      AROM   Overall AROM   Deficits    Right Shoulder Flexion  160 Degrees   PROM 170   Left Shoulder Flexion  160 Degrees   170   Cervical Flexion  60    Cervical Extension  46    Cervical - Right Side Bend  55     Cervical - Left Side Bend  50    Cervical - Right Rotation  65    Cervical - Left Rotation  65      Strength   Overall Strength  Deficits    Overall Strength Comments  supine deep flex lift 1 inch from supine  for 30sec  normal is 35 sec    Right Shoulder Flexion  5/5    Right Shoulder ABduction  4+/5    Left Shoulder Flexion  5/5    Left Shoulder ABduction  4+/5      Palpation   Palpation comment  decreased tissue tension bil cervical paraspinals and upper trap. LT > RT today                   [facility] Adult PT Treatment/Exercise - 02/11/20 0001      Shoulder Exercises: Standing   Other Standing Exercises  45# DB DL 3 x 5 reps, suitcase carry with  10 lb RT and LT 15 lb for 80 ft,,  then elevated upper trap with both bil 10 lb wt x 20      Modalities   Modalities  Moist Heat      Moist Heat Therapy   Number Minutes Moist Heat  10 Minutes    Moist Heat Location  Cervical   upper thoracic     Manual Therapy   Manual Therapy  Soft tissue mobilization    Manual therapy comments  skilled PT with TPDN    Joint Mobilization  C-3to 5 UPA , PA glide, lateral glide bil grade 3     Soft tissue mobilization  bil upper trap and levator and rhomboids , sub scap       Trigger Point Dry Needling - 02/11/20 0001    Consent Given?  Yes    Education Handout Provided  Previously provided    Muscles Treated Head and Neck  Upper trapezius;Levator scapulae;Splenius capitus;Cervical multifidi;Suboccipitals   bil all TPDN   Muscles Treated Upper Quadrant  Rhomboids   mostly RT   Dry Needling Comments  40 mm .30 gage    Upper Trapezius Response  Twitch reponse elicited;Palpable increased muscle length    Suboccipitals Response  Twitch response elicited;Palpable increased muscle length    Levator Scapulae Response  Twitch response elicited;Palpable increased muscle length    Splenius capitus Response  Twitch reponse elicited;Palpable increased muscle length    Cervical multifidi  Response  Twitch reponse elicited;Palpable increased muscle length   C-2- C-5   Rhomboids Response  Twitch response elicited;Palpable increased muscle length           PT Education - 02/11/20 1129    Education Details  worked on [employer] and full body with emphasis on neck position and carrying laundry with wts in front    Person(s) Educated  Patient    Methods  Explanation;Demonstration    Comprehension  Verbalized understanding;Returned demonstration       PT Short Term Goals - 01/28/20 0824      PT SHORT TERM GOAL #1   Title  Pt will be independent with initial HEP    Baseline  moving on to advanced HEP    Time  3    Period  Weeks    Status  Achieved    Target Date  01/30/20      PT SHORT TERM GOAL #2   Title  Pt will be educated on Work Station set up    Baseline  has information and can continue asking questions as needs arise    Time  3    Period  Weeks    Status  Achieved    Target Date  01/30/20        PT Long Term Goals - 02/11/20 0824      PT LONG TERM GOAL #1   Title  Pt will be independent with advanced HEP    Baseline  Pt given advanced HEP ,  KB #45 DL    Time  8    Period  Weeks    Status  On-going      PT LONG TERM GOAL #2   Title  "Demonstrate understanding of proper sitting posture, body mechanics, work ergonomics, and be more conscious of position and posture throughout the day.     Baseline  plans to purchase standing desk.  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    Personal Factors and Comorbidities  Profession    Examination-Activity Limitations  Sit    Stability/Clinical Decision Making  Stable/Uncomplicated    Clinical Decision Making  Low    Rehab Potential  Good    PT Frequency  1x / week    PT Duration  8 weeks    PT Treatment/Interventions  Electrical Stimulation;Iontophoresis 25m/ml Dexamethasone;Moist Heat;Traction;Ultrasound;Therapeutic exercise;Neuromuscular re-education;Patient/family education;Manual techniques;Dry needling;Passive range of motion;Taping;Spinal Manipulations;Joint Manipulations    PT Next Visit Plan  continue full body and UE strengthe  TPDN    PT Home Exercise Plan  neck stretches UT, Levator Dead lift and hip hinge and serratus DB and KB lifts.    Consulted and Agree with Plan of Care  Patient       Patient will benefit from skilled therapeutic intervention in order to improve the following deficits and impairments:  Decreased activity tolerance, Increased fascial restricitons, Increased muscle spasms, Decreased  strength  Visit Diagnosis: Cervicalgia  Muscle weakness (generalized)  Cramp and spasm  Abnormal posture     Problem List Patient Active Problem List   Diagnosis Date Noted  . 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 Excessive daytime sleepiness 03/13/2018  . Snoring 03/13/2018  . Chronic migraine without aura, with intractable migraine, so stated, with status migrainosus 08/03/2016    LVoncille Lo PT Certified Exercise Expert for the Aging Adult  02/11/20 11:36 AM Phone: [phone]: 3[place] [id number] [address]Phone: [phone]  Fax:  [phone] Name: Jennifer Sims MRN: [id number] of Birth: [birth year]

## 2020-02-18 ENCOUNTER — Ambulatory Visit: Payer: BC Managed Care – PPO | Admitting: Physical Therapy

## 2020-02-25 ENCOUNTER — Ambulatory Visit: Payer: BC Managed Care – PPO | Admitting: Physical Therapy

## 2020-02-27 ENCOUNTER — Encounter: Payer: Self-pay | Admitting: Physical Therapy

## 2020-02-27 ENCOUNTER — Ambulatory Visit: Payer: BC Managed Care – PPO | Attending: Neurology | Admitting: Physical Therapy

## 2020-02-27 ENCOUNTER — Other Ambulatory Visit: Payer: Self-pay

## 2020-02-27 DIAGNOSIS — R252 Cramp and spasm: Secondary | ICD-10-CM | POA: Insufficient documentation

## 2020-02-27 DIAGNOSIS — M542 Cervicalgia: Secondary | ICD-10-CM | POA: Diagnosis not present

## 2020-02-27 DIAGNOSIS — M6281 Muscle weakness (generalized): Secondary | ICD-10-CM

## 2020-02-27 DIAGNOSIS — R293 Abnormal posture: Secondary | ICD-10-CM | POA: Diagnosis present

## 2020-02-27 NOTE — Therapy (Signed)
[place], [place], [address] Phone: [phone]   Fax:  [phone]  Physical Therapy Treatment  Patient Details  Name: Jennifer Sims MRN: [id number] Date of Birth: [birth year] Referring Provider (PT): Sarina Ill MD   Encounter Date: 02/27/2020  PT End of Session - 02/27/20 0844    Visit Number  6    Number of Visits  9    Date for PT Re-Evaluation  03/05/20    Authorization Type  BCBS    PT Start Time  0805    PT Stop Time  0856    PT Time Calculation (min)  51 min    Activity Tolerance  Patient tolerated treatment well    Behavior During Therapy  [facility] for tasks assessed/performed       Past Medical History:  Diagnosis Date  . Crohn's disease ([place])   . Migraine   . Osteopenia     Past Surgical History:  Procedure Laterality Date  . CHOLECYSTECTOMY  2009    There were no vitals filed for this visit.  Subjective Assessment - 02/27/20 0806    Subjective  Today I am a 1/10 this week  I only had on mild headache.    Limitations  Sitting    Patient Stated Goals  Strengthen neck , assess work station and decrease pain    Currently in Pain?  Yes    Pain Score  1     Pain Location  Neck    Pain Orientation  Right;Left                       [facility] Adult PT Treatment/Exercise - 02/27/20 0001      Shoulder Exercises: Standing   Other Standing Exercises  biceps curl and OH press with 8# bil  3 x 10   blue t band pallof press with on strand 2 x 10 and then 2 strands x 10    with RT and then LT rotation      Moist Heat Therapy   Number Minutes Moist Heat  15 Minutes    Moist Heat Location  Cervical   thoracic     Manual Therapy   Manual Therapy  Soft tissue mobilization;Joint mobilization    Manual therapy comments  skilled PT with TPDN    Joint Mobilization  C-3to 5 UPA , PA glide, lateral glide bil grade 3     Soft tissue mobilization  bil upper trap and levator and  rhomboids , sub occipital release scalenes       Trigger Point Dry Needling - 02/27/20 0001    Consent Given?  Yes    Education Handout Provided  Previously provided    Muscles Treated Head and Neck  Upper trapezius;Levator scapulae;Splenius capitus;Suboccipitals   bi   Upper Trapezius Response  Twitch reponse elicited;Palpable increased muscle length    Suboccipitals Response  Twitch response elicited;Palpable increased muscle length    Levator Scapulae Response  Twitch response elicited;Palpable increased muscle length    Splenius capitus Response  Palpable increased muscle length           PT Education - 02/27/20 0821    Education Details  added blue t band pallof press and DB biceps curl and overhead press to HEP    Person(s) Educated  Patient    Methods  Explanation;Demonstration;Tactile cues;Verbal cues;Handout    Comprehension  Verbalized understanding;Returned demonstration       PT Short Term  Goals - 01/28/20 0824      PT SHORT TERM GOAL #1   Title  Pt will be independent with initial HEP    Baseline  moving on to advanced HEP    Time  3    Period  Weeks    Status  Achieved    Target Date  01/30/20      PT SHORT TERM GOAL #2   Title  Pt will be educated on Work Station set up    Baseline  has information and can continue asking questions as needs arise    Time  3    Period  Weeks    Status  Achieved    Target Date  01/30/20        PT Long Term Goals - 02/11/20 0824      PT LONG TERM GOAL #1   Title  Pt will be independent with advanced HEP    Baseline  Pt given advanced HEP ,  KB #45 DL    Time  8    Period  Weeks    Status  On-going      PT LONG TERM GOAL #2   Title  "Demonstrate understanding of proper sitting posture, body mechanics, work ergonomics, and be more conscious of position and posture throughout the day.     Baseline  plans to purchase standing desk.  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  Added to HEP for strengthening with weights and pallof press.  Pt has one more visit before DC    Personal Factors and Comorbidities  Profession    Examination-Activity Limitations  Sit    Stability/Clinical Decision Making  Stable/Uncomplicated    Clinical Decision Making  Low    Rehab Potential  Good    PT Frequency  1x / week    PT Duration  8 weeks    PT Treatment/Interventions  Electrical Stimulation;Iontophoresis 37m/ml Dexamethasone;Moist Heat;Traction;Ultrasound;Therapeutic exercise;Neuromuscular re-education;Patient/family education;Manual techniques;Dry needling;Passive range of motion;Taping;Spinal Manipulations;Joint Manipulations  PT Next Visit Plan  continue full body and UE strengthe  TPDN prn    PT Home Exercise Plan  neck stretches UT, Levator Dead lift and hip hinge and serratus DB and KB lifts. OH press pallof press blue t band    Consulted and Agree with Plan of Care  Patient       Patient will benefit from skilled  therapeutic intervention in order to improve the following deficits and impairments:  Decreased activity tolerance, Increased fascial restricitons, Increased muscle spasms, Decreased strength  Visit Diagnosis: Cervicalgia  Muscle weakness (generalized)  Cramp and spasm  Abnormal posture    Access Code: [email or web address]  URL: [email or web address]  Date: 02/27/2020  Prepared by: Voncille Lo   Exercises  Standing Anti-Rotation Press with Anchored Resistance - 10 reps - 3 sets - 1x daily - 7x weekly  Shoulder Overhead Press in Flexion with Dumbbells - 1 reps - 2 sets - 3 hold - 1x daily - 7x weekly    Problem List Patient Active Problem List   Diagnosis Date Noted  . 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 Excessive daytime sleepiness 03/13/2018  . Snoring 03/13/2018  . Chronic migraine without aura, with intractable migraine, so stated, with status migrainosus 08/03/2016    Voncille Lo, PT Certified Exercise Expert for the Aging Adult  02/27/20 12:28 PM Phone: [phone] Fax: [place] [facility] [address] [place], [place], [address] Phone: [phone]   Fax:  [phone]  Name: Geraldine Sandberg Sims MRN: [id number] Date of Birth: 09/14/1966

## 2020-02-27 NOTE — Patient Instructions (Signed)
   Voncille Lo, PT Certified Exercise Expert for the Aging Adult  02/27/20 8:21 AM Phone: [phone] Fax: [phone]

## 2020-03-03 ENCOUNTER — Ambulatory Visit: Payer: BC Managed Care – PPO | Admitting: Physical Therapy

## 2020-03-05 ENCOUNTER — Other Ambulatory Visit: Payer: Self-pay

## 2020-03-05 ENCOUNTER — Ambulatory Visit: Payer: BC Managed Care – PPO | Attending: Family

## 2020-03-05 ENCOUNTER — Ambulatory Visit: Payer: BC Managed Care – PPO | Admitting: Physical Therapy

## 2020-03-05 ENCOUNTER — Encounter: Payer: Self-pay | Admitting: Physical Therapy

## 2020-03-05 DIAGNOSIS — Z23 Encounter for immunization: Secondary | ICD-10-CM

## 2020-03-05 DIAGNOSIS — M542 Cervicalgia: Secondary | ICD-10-CM

## 2020-03-05 DIAGNOSIS — M6281 Muscle weakness (generalized): Secondary | ICD-10-CM

## 2020-03-05 DIAGNOSIS — R293 Abnormal posture: Secondary | ICD-10-CM

## 2020-03-05 DIAGNOSIS — R252 Cramp and spasm: Secondary | ICD-10-CM

## 2020-03-05 NOTE — Therapy (Signed)
[place], [place], [address] Phone: [phone]   Fax:  [phone]  Physical Therapy Treatment/ERO  Patient Details  Name: Jennifer Sims MRN: [id number] Date of Birth: 10-Jan-1966 Referring Provider (PT): Sarina Ill MD   Encounter Date: 03/05/2020  PT End of Session - 03/05/20 0808    Visit Number  7    Number of Visits  9    Date for PT Re-Evaluation  03/26/20    Authorization Type  BCBS    PT Start Time  0800    PT Stop Time  0900    PT Time Calculation (min)  60 min    Activity Tolerance  Patient tolerated treatment well    Behavior During Therapy  [facility] for tasks assessed/performed       Past Medical History:  Diagnosis Date  . Crohn's disease ([place])   . Migraine   . Osteopenia     Past Surgical History:  Procedure Laterality Date  . CHOLECYSTECTOMY  2009    There were no vitals filed for this visit.  Subjective Assessment - 03/05/20 0809    Subjective  I woke with small headache but I weight lifted and exericised  17 min walk and used my machine universal machine and did knee ext with 70 lb and worked 20 lb with calf muscle.  Compound rows this morning 80lb    Limitations  Sitting    How long can you sit comfortably?  able to sit for 2 hours and use good posture!    How long can you stand comfortably?  went to [employer] for 3 hours with sons/family    How long can you walk comfortably?  went to [employer] for 3 hours with sons/family    Patient Stated Goals  Strengthen neck , assess work station and decrease pain    Pain Score  0-No pain    Pain Location  Neck    Pain Orientation  Right;Left    Pain Descriptors / Indicators  Tightness         [facility] PT Assessment - 03/05/20 0001      Assessment   Medical Diagnosis  Cervical Myofascial Syndrome Neck Pain R>L    Referring Provider (PT)  Sarina Ill MD    Onset Date/Surgical Date  12/30/19   has had neck pain > 30 years   recent exacerbation     Observation/Other Assessments   Focus on Therapeutic Outcomes (FOTO)   FOTO intake 96%, limtation 4% predicted 19%      AROM   Overall AROM   Deficits    Right Shoulder Flexion  160 Degrees   PROM 170   Left Shoulder Flexion  160 Degrees   170   Cervical Flexion  60    Cervical Extension  54    Cervical - Right Side Bend  55    Cervical - Left Side Bend  50    Cervical - Right Rotation  65    Cervical - Left Rotation  65      Strength   Overall Strength  Deficits    Overall Strength Comments  supine deep flex lift 1 inch from supine  for 40sec  normal is 35 sec    Right Shoulder Flexion  5/5    Right Shoulder ABduction  5/5    Left Shoulder Flexion  5/5    Left Shoulder ABduction  5/5      Palpation   Palpation comment  no pain only paraspinal tightness over cervical and thoracic area                   [facility] Adult PT Treatment/Exercise - 03/05/20 0001      Self-Care   Self-Care  Other Self-Care Comments    Other Self-Care Comments   utilized and demo theracane fortrigger pint release along with tennis balls previously given. discussed using Universal for weight traininng and pain relief      Moist Heat Therapy   Number Minutes Moist Heat  15 Minutes    Moist Heat Location  Cervical   thoracic     Manual Therapy   Manual Therapy  Soft tissue mobilization;Joint mobilization    Manual therapy comments  skilled PT with TPDN    Joint Mobilization  C-3to 5 UPA , PA glide, lateral glide bil grade 3     Soft tissue mobilization  bil upper trap and levator and rhomboids , sub occipital release scalenes all mx with TPDN       Trigger Point Dry Needling - 03/05/20 0001    Consent Given?  Yes    Education Handout Provided  Previously provided    Muscles Treated Head and Neck  Upper trapezius;Levator scapulae;Splenius capitus;Suboccipitals;Scalenes   bil   Muscles Treated Upper Quadrant  Rhomboids;Subscapularis   mostly RT   Dry Needling  Comments  40 mm .30 gage    Upper Trapezius Response  Twitch reponse elicited;Palpable increased muscle length    Suboccipitals Response  Twitch response elicited;Palpable increased muscle length    Levator Scapulae Response  Twitch response elicited;Palpable increased muscle length    Scalenes Response  Twitch reponse elicited;Palpable increased muscle length    Splenius capitus Response  Palpable increased muscle length    Cervical multifidi Response  Twitch reponse elicited;Palpable increased muscle length    Subscapularis Response  Palpable increased muscle length   LT          PT Education - 03/05/20 0909    Education Details  Discussed use of Theracane and trigger point release and use of Universal for weight training    Person(s) Educated  Patient    Methods  Explanation;Demonstration    Comprehension  Verbalized understanding;Returned demonstration       PT Short Term Goals - 01/28/20 0824      PT SHORT TERM GOAL #1   Title  Pt will be independent with initial HEP    Baseline  moving on to advanced HEP    Time  3    Period  Weeks    Status  Achieved    Target Date  01/30/20      PT SHORT TERM GOAL #2   Title  Pt will be educated on Work Station set up    Baseline  has information and can continue asking questions as needs arise    Time  3    Period  Weeks    Status  Achieved    Target Date  01/30/20        PT Long Term Goals - 03/05/20 0812      PT LONG TERM GOAL #1   Title  Pt will be independent with advanced HEP    Baseline  Pt given advanced HEP  needs reinforcement    Time  3    Period  Weeks    Status  On-going    Target Date  03/26/20      PT LONG TERM GOAL #2  Title  "Demonstrate understanding of proper sitting posture, body mechanics, work ergonomics, and be more conscious of position and posture throughout the day.     Baseline  plans to purchase standing desk.  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 She only needs to achieve LTG# 1 for being independent ith advanced HEP.  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 she described having a small headache this morning and was able to work it our using weights and exericise. FOTO limitation 4% ( predicted 19%)  Pt will benefit from 1-2 addtional appt to reinforce HEP and TPDN for residual minor trigger points over the next 3 weeks before DC.    Personal Factors and Comorbidities  Profession    Examination-Activity Limitations  Sit    Clinical Decision Making  Low    Rehab Potential  Excellent    PT Frequency  --   1-2 visits over the next 3 weeks   PT Duration  3 weeks    PT Treatment/Interventions  Electrical Stimulation;Iontophoresis 75m/ml Dexamethasone;Moist  Heat;Traction;Ultrasound;Therapeutic exercise;Neuromuscular re-education;Patient/family education;Manual techniques;Dry needling;Passive range of motion;Taping;Spinal Manipulations;Joint Manipulations    PT Next Visit Plan  needs to reinforce HEP on Universal and then DC    PT Home Exercise Plan  neck stretches UT, Levator Dead lift and hip hinge and serratus DB and KB lifts. OH press pallof press blue t band Universal and free weights    Consulted and Agree with Plan of Care  Patient       Patient will benefit from skilled therapeutic intervention in order to improve the following deficits and impairments:  Decreased activity tolerance, Increased fascial restricitons, Increased muscle spasms, Decreased strength  Visit Diagnosis: Cervicalgia  Muscle weakness (generalized)  Cramp and spasm  Abnormal posture     Problem List Patient Active Problem List   Diagnosis Date Noted  . 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 Excessive daytime sleepiness 03/13/2018  . Snoring 03/13/2018  . Chronic migraine without aura, with intractable migraine, so stated, with status migrainosus 08/03/2016    Voncille Lo, PT Certified Exercise Expert for the Aging Adult  03/05/20 9:19 AM Phone: [phone] Fax: [facility] [address] [place], [place], [address] Phone: [phone]   Fax:  [phone]  Name: Jennifer Sims MRN: [id number] Date of Birth: 07-26-1966

## 2020-03-05 NOTE — Progress Notes (Signed)
   Covid-19 Vaccination Clinic  Name:  Jennifer Sims    MRN: [id number] DOB: June 29, 1966  03/05/2020  [name] was observed post Covid-19 immunization for 15 minutes without incident. She was provided with Vaccine Information Sheet and instruction to access the V-Safe system.   [name] was instructed to call 911 with any severe reactions post vaccine: Marland Kitchen Difficulty breathing  . Swelling of face and throat  . A fast heartbeat  . A bad rash all over body  . Dizziness and weakness   Immunizations Administered    Name Date Dose VIS Date Route   Moderna COVID-19 Vaccine 03/05/2020  9:56 AM 0.5 mL 11/26/2019 Intramuscular   Manufacturer: Moderna   Lot: [id number]   [place]: 79444-619-01

## 2020-03-12 ENCOUNTER — Other Ambulatory Visit: Payer: Self-pay

## 2020-03-12 ENCOUNTER — Encounter: Payer: Self-pay | Admitting: Physical Therapy

## 2020-03-12 ENCOUNTER — Ambulatory Visit: Payer: BC Managed Care – PPO | Admitting: Physical Therapy

## 2020-03-12 DIAGNOSIS — M6281 Muscle weakness (generalized): Secondary | ICD-10-CM

## 2020-03-12 DIAGNOSIS — R293 Abnormal posture: Secondary | ICD-10-CM

## 2020-03-12 DIAGNOSIS — M542 Cervicalgia: Secondary | ICD-10-CM

## 2020-03-12 DIAGNOSIS — R252 Cramp and spasm: Secondary | ICD-10-CM

## 2020-03-12 NOTE — Patient Instructions (Signed)
   Voncille Lo, PT Certified Exercise Expert for the Aging Adult  03/12/20 8:17 AM Phone: [phone] Fax: [phone]

## 2020-03-12 NOTE — Therapy (Addendum)
[place], [place], [address] Phone: [phone]   Fax:  [phone]  Physical Therapy Treatment/Discharge Note  Patient Details  Name: Jennifer Sims MRN: [id number] Date of Birth: 04-Mar-1966 Referring Provider (PT): Sarina Ill MD   Encounter Date: 03/12/2020  PT End of Session - 03/12/20 0900    Visit Number  8    Number of Visits  9    Date for PT Re-Evaluation  03/26/20    Authorization Type  BCBS    PT Start Time  0800    PT Stop Time  0854    PT Time Calculation (min)  54 min    Activity Tolerance  Patient tolerated treatment well    Behavior During Therapy  [facility] for tasks assessed/performed       Past Medical History:  Diagnosis Date  . Crohn's disease ([place])   . Migraine   . Osteopenia     Past Surgical History:  Procedure Laterality Date  . CHOLECYSTECTOMY  2009    There were no vitals filed for this visit.  Subjective Assessment - 03/12/20 0805    Subjective  I walk ran for 40 minutes and I did some strengthening. LAQ 70 lb, 11 reps I do have questions about deadlifts and injury prevention    Limitations  Sitting    How long can you sit comfortably?  able to sit for 2 hours and use good posture!    How long can you stand comfortably?  went to [employer] for 3 hours with sons/family    How long can you walk comfortably?  went to [employer] for 3 hours with sons/family    Patient Stated Goals  Strengthen neck , assess work station and decrease pain    Currently in Pain?  No/denies    Pain Score  0-No pain    Pain Location  Neck         [facility] PT Assessment - 03/12/20 0001      Assessment   Medical Diagnosis  Cervical Myofascial Syndrome Neck Pain R>L    Referring Provider (PT)  Sarina Ill MD    Onset Date/Surgical Date  12/30/19   has had neck pain > 30 years  recent exacerbation     Observation/Other Assessments   Focus on Therapeutic Outcomes (FOTO)   FOTO  intake 96%, limtation 4% predicted 19%   taken on 03-05-20     AROM   Overall AROM   Deficits    Right Shoulder Flexion  160 Degrees   PROM 170   Left Shoulder Flexion  160 Degrees   170   Cervical Flexion  60    Cervical Extension  54    Cervical - Right Side Bend  55    Cervical - Left Side Bend  50    Cervical - Right Rotation  65    Cervical - Left Rotation  65      Strength   Overall Strength  Deficits    Overall Strength Comments  supine deep flex lift 1 inch from supine  for 40sec  normal is 35 sec    Right Shoulder Flexion  5/5    Right Shoulder ABduction  5/5    Left Shoulder Flexion  5/5    Left Shoulder ABduction  5/5      Palpation   Palpation comment  no pain only paraspinal tightness over cervical and thoracic area   responds well to [facility]  [facility] Adult PT Treatment/Exercise - 03/12/20 0001      Self-Care   Self-Care  Other Self-Care Comments    Other Self-Care Comments   discussed RPE and gave handout, working out with HIIT /aerobic levels , proper execution of DL and Hip hinge       Shoulder Exercises: Standing   Other Standing Exercises  worked on DL with 30# 3 x 5 with proper form and position for neck to prevent injuries.  Hip hinge with dowel 2 x 10       Moist Heat Therapy   Number Minutes Moist Heat  15 Minutes    Moist Heat Location  Cervical   thoracic     Manual Therapy   Manual Therapy  Soft tissue mobilization;Joint mobilization    Manual therapy comments  skilled PT with TPDN    Joint Mobilization  C-3to 5 UPA , PA glide, lateral glide bil grade 3     Soft tissue mobilization  bil upper trap and levator and rhomboids , sub occipital release scalenes all mx with TPDN       Trigger Point Dry Needling - 03/12/20 0001    Consent Given?  Yes    Education Handout Provided  Previously provided    Muscles Treated Head and Neck  Upper trapezius;Levator scapulae;Splenius capitus;Suboccipitals;Scalenes   bil   Muscles  Treated Upper Quadrant  Rhomboids;Subscapularis   mostly RT   Dry Needling Comments  40 mm .30 gage    Upper Trapezius Response  Twitch reponse elicited;Palpable increased muscle length    Suboccipitals Response  Twitch response elicited;Palpable increased muscle length    Levator Scapulae Response  Twitch response elicited;Palpable increased muscle length    Splenius capitus Response  Palpable increased muscle length    Cervical multifidi Response  Twitch reponse elicited;Palpable increased muscle length   C-3 to T-2 bil   Subscapularis Response  Palpable increased muscle length   RT          PT Education - 03/12/20 0811    Education Details  educated on RPE and use of DL and hip hinge with proper neck position to prevent injury,  Reviewed Sleep/exericise for health/strength for DC    Person(s) Educated  Patient    Methods  Explanation;Demonstration;Tactile cues;Verbal cues;Handout    Comprehension  Verbalized understanding;Returned demonstration       PT Short Term Goals - 01/28/20 0824      PT SHORT TERM GOAL #1   Title  Pt will be independent with initial HEP    Baseline  moving on to advanced HEP    Time  3    Period  Weeks    Status  Achieved    Target Date  01/30/20      PT SHORT TERM GOAL #2   Title  Pt will be educated on Work Station set up    Baseline  has information and can continue asking questions as needs arise    Time  3    Period  Weeks    Status  Achieved    Target Date  01/30/20        PT Long Term Goals - 03/12/20 0817      PT LONG TERM GOAL #1   Title  Pt will be independent with advanced HEP    Baseline  walk running on treadmill for 40 min  independent with HEP    Time  3    Period  Weeks    Status  Achieved  PT LONG TERM GOAL #2   Title  "Demonstrate understanding of proper sitting posture, body mechanics, work ergonomics, and be more conscious of position and posture throughout the day.     Baseline  plans to purchase standing  desk.  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  She has not been experiencing any headaches and has increased strength of deep neck flexors/ no pain in bil neck.  she was able to participate 3 hours at [employer] with sons and this morning was able to complete 40 minutes of exericise walk/running on treadmill before PT session.  She has completed all LTG's and limiation improved from 31% limitation to 4%.  She is ready for DC and independent with HEP. [name] is a joy for whom to serve.    Personal Factors and Comorbidities  Profession    Examination-Activity Limitations  Sit    PT Frequency  --   1-2 visits over next 3 weeks   PT Duration  3 weeks    PT Treatment/Interventions  Electrical Stimulation;Iontophoresis 78m/ml Dexamethasone;Moist Heat;Traction;Ultrasound;Therapeutic exercise;Neuromuscular re-education;Patient/family education;Manual techniques;Dry needling;Passive range of motion;Taping;Spinal Manipulations;Joint  Manipulations    PT Next Visit Plan  DC    PT Home Exercise Plan  neck stretches UT, Levator Dead lift and hip hinge and serratus DB and KB lifts. OH press pallof press blue t band Universal and free weights [id number]    Consulted and Agree with Plan of Care  Patient      Access Code: [email or web address]: 03/18/2021Prepared by: LDonnetta SimpersBeardsleyExercises  Kettlebell Deadlift - 1 x daily - 7 x weekly - 3 sets - 5-6 reps - 60 hold  Standing Hip Hinge with Dowel - 1 x daily - 7 x weekly - 10 reps - 3 sets   Patient will benefit from skilled therapeutic intervention in order to improve the following deficits and impairments:  Decreased activity tolerance, Increased fascial restricitons, Increased muscle spasms, Decreased strength  Visit Diagnosis: Cervicalgia  Muscle weakness (generalized)  Cramp and spasm  Abnormal posture  Problem List Patient Active Problem List   Diagnosis Date Noted  . 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 Excessive daytime sleepiness 03/13/2018  . Snoring 03/13/2018  . Chronic migraine without aura, with intractable migraine, so stated, with status migrainosus 08/03/2016   Voncille Lo, PT Certified Exercise Expert for the Aging Adult  03/12/20 9:59 AM Phone: [phone] Fax: [facility] [address] [place], [place], [address] Phone: [phone]   Fax:  [phone]  Name: Jennifer Sims MRN: [id number] Date of Birth: [birth year]   PHYSICAL THERAPY DISCHARGE SUMMARY  Visits from Start of Care: 8  Current functional level related to goals / functional outcomes: As above   Remaining deficits: None,  No headaches or neck pain   Education / Equipment: HEP Plan: Patient agrees to discharge.  Patient goals were met. Patient is being discharged due to meeting the stated rehab goals.  ?????    And being pleased with current  functional progress.  Voncille Lo, PT Certified Exercise Expert for the Aging Adult  03/12/20 9:59 AM Phone: [phone] Fax: [phone]

## 2020-03-24 ENCOUNTER — Ambulatory Visit: Payer: BC Managed Care – PPO | Admitting: Neurology

## 2020-03-24 ENCOUNTER — Telehealth: Payer: Self-pay | Admitting: Neurology

## 2020-03-24 NOTE — Telephone Encounter (Signed)
Thank you!    Jennifer Sims

## 2020-03-24 NOTE — Telephone Encounter (Signed)
Josem Kaufmann Form has been Faxed to [facility] for Botox Reference # [id number]  Fax [phone] Botox

## 2020-03-24 NOTE — Telephone Encounter (Signed)
Patient's Insurance is still pending with [employer] . Patient's Apt had to CX Patient was very understanding and kind. Bethany I told her once we get approval back we could try to  fit her in some where next week. I will keep you up dated on insurance process.

## 2020-03-25 NOTE — Telephone Encounter (Signed)
[employer] is calling and Patient has been on RX's   Susie RN  [phone]   UBRELVY 50 MG TABS ,  EMGALITY 120 MG/ML SOAJ , [employer] Is stating that patient needs to be on these RX's with out being on Botox and tried and failed with out the Botox . [employer] is asking to close this request ? Patient will probably will  denied. Per [employer] we should take Her off Botox for There Months Document and then reply .   Dr.Ahern Please help Can you call the nurse she has been getting Botox so long.

## 2020-03-25 NOTE — Telephone Encounter (Signed)
Let's see what she prefers.

## 2020-03-25 NOTE — Telephone Encounter (Signed)
I spoke with the pt. Her preference is to stop the Emgality and the [place] so Botox can be approved.

## 2020-03-26 NOTE — Telephone Encounter (Addendum)
Per Hinton Dyer, need office visit with documentation for Botox PA stating pt is going to stop the [employer] so she can have botox approved. I called pt & scheduled her for this coming Monday 03/30/20 @ 3 pm with Jinny Blossom NP. Pt verbalized appreciation for the call.

## 2020-03-30 ENCOUNTER — Encounter: Payer: Self-pay | Admitting: Adult Health

## 2020-03-30 ENCOUNTER — Ambulatory Visit: Payer: BC Managed Care – PPO | Admitting: Adult Health

## 2020-03-30 ENCOUNTER — Other Ambulatory Visit: Payer: Self-pay

## 2020-03-30 VITALS — BP 121/84 | HR 61 | Temp 98.2°F | Ht 62.0 in

## 2020-03-30 DIAGNOSIS — G43711 Chronic migraine without aura, intractable, with status migrainosus: Secondary | ICD-10-CM | POA: Diagnosis not present

## 2020-03-30 NOTE — Patient Instructions (Signed)
Your Plan:  Continue Botox  Stop Emgality and Roselyn Meier If your symptoms worsen or you develop new symptoms please let us know.    Thank you for coming to see [place] at [facility]. I hope we have been able to provide you high quality care today.  You may receive a patient satisfaction survey over the next few weeks. We would appreciate your feedback and comments so that we may continue to improve ourselves and the health of our patients.

## 2020-03-30 NOTE — Progress Notes (Addendum)
PATIENT: Jennifer Sims DOB: [birth year]  REASON FOR VISIT: follow up HISTORY FROM: patient  HISTORY OF PRESENT ILLNESS: Today 03/30/20:  Jennifer Sims is a 54 year old female with a history of migraine headaches.  She is currently taking Botox, Emgality for preventative treatment and Ubrelvy as a rescue medication.  She reports that she has 1-2 migraines a week.  Now insurance will not cover Botox if she is on [place].  Patient prefers to stay on Botox   HISTORY 12/24/2019: >95% improvement on botox, frequency migraines. Wil try the new medications, acute. Roselyn Meier works best. Also Nurtec. Resend to dry needling, she loved Donnetta Simpers will reuest for cervical myofascial pain syndrome. She has 2 kids 86 and 42. 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 Hanley Seamen her a few bio freeze to try.  REVIEW OF SYSTEMS: Out of a complete 14 system review of symptoms, the patient complains only of the following symptoms, and all other reviewed systems are negative.  See HPI  ALLERGIES: Allergies  Allergen Reactions  . Sulfur Nausea Only    HOME MEDICATIONS: Outpatient Medications Prior to Visit  Medication Sig Dispense Refill  . bisoprolol-hydrochlorothiazide (ZIAC) 5-6.25 MG tablet Take 1 tablet by mouth daily.    Marland Kitchen BOTOX 100 units SOLR injection INJECT  IN THE HEAD AND NECK MUSCLES AS DIRECTED. 2 each 1  . Cholecalciferol (VITAMIN D3) 1000 units CAPS Take 1 capsule by mouth daily.    . Diclofenac Potassium 50 MG PACK Take 50 mg by mouth once as needed. For migraine. 9 each 11  . docusate sodium (COLACE) 100 MG capsule Take 100 mg by mouth daily.    Marland Kitchen EMGALITY 120 MG/ML SOAJ INJECT 120 MG INTO THE SKIN EVERY 30 (THIRTY) DAYS. **DISCONTINUE AJOVY** 1 pen 11  . indomethacin (INDOCIN) 25 MG capsule Take 1-2 capsules (25-82m) capsules up to three times a day. Maximum 200 mg per day. 60 capsule 6  . Lasmiditan Succinate (REYVOW) 100 MG TABS Take 100 mg by mouth  once as needed for up to 1 dose. Once daily as needed for migraine. Watch for sedation 10 tablet 6  . magnesium oxide (MAG-OX) 400 MG tablet Take 400 mg by mouth 2 (two) times daily.    . Multiple Vitamins-Minerals (MULTIVITAMIN ADULTS PO) Take 1 tablet by mouth daily.    . Potassium Gluconate 550 MG TABS Take 1 tablet by mouth daily.    . propranolol (INDERAL) 10 MG tablet TAKE 1 TABLET (10 MG TOTAL) BY MOUTH 3 (THREE) TIMES DAILY AS NEEDED. AT ONSET OF MIGRAINE. 30 tablet 6  . RIBOFLAVIN PO Take 200 mg by mouth daily.    . Rimegepant Sulfate (NURTEC) 75 MG TBDP Take 75 mg by mouth daily as needed. For migraines. Take as close to onset of migraine as possible. One daily maximum. 10 tablet 6  . Tretinoin-Cleanser-Moisturizer 0.025 % CREAM KIT Apply topically daily.    .Marland KitchenUBRELVY 50 MG TABS TAKE 100 MG BY MOUTH EVERY 2 (TWO) HOURS AS NEEDED (MIGRAINE). MAX OF 2 DOSES (200 MG TOTAL) IN 24 HOURS. 10 tablet 6  . UNABLE TO FIND Take 1 tablet by mouth daily. Med Name: 2.4 gm  Lialda    . UNABLE TO FIND Apply 1 application topically every other day. Med Name: Minoxidil Applied to hairline    . vitamin B-12 (CYANOCOBALAMIN) 1000 MCG tablet Take 1,000 mcg by mouth daily.     No facility-administered medications prior to visit.  PAST MEDICAL HISTORY: Past Medical History:  Diagnosis Date  . Crohn's disease ([place])   . Migraine   . Osteopenia     PAST SURGICAL HISTORY: Past Surgical History:  Procedure Laterality Date  . CHOLECYSTECTOMY  2009    FAMILY HISTORY: Family History  Problem Relation Age of Onset  . Diabetes Mellitus II Mother   . Hypertension Mother   . Asthma Father   . Osteoporosis Maternal Grandmother     SOCIAL HISTORY: Social History   Socioeconomic History  . Marital status: Married    Spouse name: [name]  . Number of children: 2  . Years of education: PHD JD  . Highest education level: Not on file  Occupational History  . Occupation: University   Tobacco Use    . Smoking status: Never Smoker  . Smokeless tobacco: Never Used  Substance and Sexual Activity  . Alcohol use: Yes    Comment: 1 drink per month  . Drug use: No  . Sexual activity: Not on file  Other Topics Concern  . Not on file  Social History Narrative   Lives with husband and children   Caffeine use: 1 cup tea per day   Social Determinants of Health   Financial Resource Strain:   . Difficulty of Paying Living Expenses:   Food Insecurity:   . Worried About Charity fundraiser in the Last Year:   . Arboriculturist in the Last Year:   Transportation Needs:   . Film/video editor (Medical):   Marland Kitchen Lack of Transportation (Non-Medical):   Physical Activity:   . Days of Exercise per Week:   . Minutes of Exercise per Session:   Stress:   . Feeling of Stress :   Social Connections:   . Frequency of Communication with Friends and Family:   . Frequency of Social Gatherings with Friends and Family:   . Attends Religious Services:   . Active Member of Clubs or Organizations:   . Attends Archivist Meetings:   Marland Kitchen Marital Status:   Intimate Partner Violence:   . Fear of Current or Ex-Partner:   . Emotionally Abused:   Marland Kitchen Physically Abused:   . Sexually Abused:       PHYSICAL EXAM  Vitals:   03/30/20 1500  BP: 121/84  Pulse: 61  Temp: 98.2 F (36.8 C)  Height: 5' 2"  (1.575 m)   Body mass index is 24.51 kg/m.  Generalized: Well developed, in no acute distress   Neurological examination  Mentation: Alert oriented to time, place, history taking. Follows all commands speech and language fluent Cranial nerve II-XII:  Extraocular movements were full, visual field were full on confrontational test. Facial sensation and strength were normal. Uvula tongue midline. Head turning and shoulder shrug  were normal and symmetric. Motor: The motor testing reveals 5 over 5 strength of all 4 extremities. Good symmetric motor tone is noted throughout.  Sensory: Sensory  testing is intact to soft touch on all 4 extremities. No evidence of extinction is noted.  Coordination: Cerebellar testing reveals good finger-nose-finger and heel-to-shin bilaterally.  Gait and station: Gait is normal.   DIAGNOSTIC DATA (LABS, IMAGING, TESTING) - I reviewed patient records, labs, notes, testing and imaging myself where available.  Lab Results  Component Value Date   WBC 7.4 08/06/2007   HGB 11.6 (L) 08/06/2007   HCT 34.9 (L) 08/06/2007   MCV 86.8 08/06/2007   PLT 241 08/06/2007      Component Value  Date/Time   NA 135 08/06/2007 1115   K 3.8 08/06/2007 1115   CL 104 08/06/2007 1115   CO2 28 08/06/2007 1115   GLUCOSE 90 08/06/2007 1115   BUN 3 DELTA CHECK NOTED (L) 08/06/2007 1115   CREATININE 0.71 08/06/2007 1115   CALCIUM 8.5 08/06/2007 1115   PROT 5.8 (L) 08/06/2007 1115   ALBUMIN 2.6 (L) 08/06/2007 1115   AST 29 08/06/2007 1115   ALT 18 08/06/2007 1115   ALKPHOS 89 08/06/2007 1115   BILITOT 0.6 08/06/2007 1115   GFRNONAA >60 08/06/2007 1115   GFRAA  08/06/2007 1115    >60        The eGFR has been calculated using the MDRD equation. This calculation has not been validated in all clinical     ASSESSMENT AND PLAN 54 y.o. year old female  has a past medical history of Crohn's disease ([place]), Migraine, and Osteopenia. here with:  1.  Migraine headache  -Continue Botox therapy -Stop Emgality -Originally insurance reported that she will need to stop it Roselyn Meier if she wants to continue on Botox however I had Jayme CloudEngineering geologist ) call and the patient can actually stay on you Roselyn Meier - advised Botox coordinator will call to schedule an appointment once this has been approved    I spent 20 minutes of face-to-face and non-face-to-face time with patient.  This included previsit chart review, lab review, study review, order entry, electronic health record documentation, patient education.  Ward Givens, MSN, NP-C 03/30/2020, 3:06 PM [facility] [address], [place], [place] [address] [phone]  Made any corrections needed, and agree with history, physical, neuro exam,assessment and plan as stated.     Sarina Ill, MD [facility]

## 2020-03-31 NOTE — Telephone Encounter (Signed)
[name]'s Notes have been faxed to [place] for Botox approval .   Reference # [id number]  Fax [phone] Botox  Called patient and made her aware.

## 2020-03-31 NOTE — Telephone Encounter (Signed)
Per Jayme Cloud, Botox approved! I called pt and scheduled her for this Thursday April 8th @ 1:30 pm. Pt verbalized appreciation.

## 2020-04-01 ENCOUNTER — Telehealth: Payer: Self-pay | Admitting: Neurology

## 2020-04-01 NOTE — Telephone Encounter (Addendum)
04/01/2020  [id number] Approved Botox  [facility]   Reff # [id number] 03/24/20-03/24/21 Jayme Cloud

## 2020-04-02 ENCOUNTER — Ambulatory Visit (INDEPENDENT_AMBULATORY_CARE_PROVIDER_SITE_OTHER): Payer: BC Managed Care – PPO | Admitting: Neurology

## 2020-04-02 ENCOUNTER — Other Ambulatory Visit: Payer: Self-pay

## 2020-04-02 VITALS — Temp 97.7°F

## 2020-04-02 DIAGNOSIS — G43711 Chronic migraine without aura, intractable, with status migrainosus: Secondary | ICD-10-CM

## 2020-04-02 NOTE — Progress Notes (Signed)
Botox- 200 units x 1 vial Lot: [id number] Expiration: 11/2022 NDC: 7473-4037-09  Bacteriostatic 0.9% Sodium Chloride- 51m total Lot: [id number]: 06/25/2020 NDC: [address]-36 Dx: [id number]/B

## 2020-04-02 NOTE — Progress Notes (Signed)
Interval history 04/02/2020: >95% improvement on botox, frequency migraines. Wil try the new medications, acute. Roselyn Meier works best.. Resend to dry needling, she loved Donnetta Simpers will reuest for cervical myofascial pain syndrome. 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 Hanley Seamen her a few bio freeze to try, she said the biofreeze worked.  Interval history 05/29/2019: sill doing well on botox. She had 100% relief. Has had a few migraines as we are late on botox theray due to covid19.   Interval history 02/19/2019: Not one headache since last botox, remarkable improvement on botox. +temples and oo. No masseters. +traps and +levators. She loved the dry needling, [place] on [address]. She had rebound from imitrex, ibuprofen and other meds. She is on Emgality and botox. She has not had to use any acute management meds. She had an allergic reaction to ajovy. Patient's neck has worsened, acute cervical strain on chronic myofascial cervical pain. She did great with dry needling will refer again and she is now doing pilates and doin well.   She had eye lower lid surgery Dr. Freddi Starr, saw .Marland Kitchen..doing well.      Consent Form Botulism Toxin Injection For Chronic Migraine    Reviewed orally with patient, additionally signature is on file:  Botulism toxin has been approved by the Federal drug administration for treatment of chronic migraine. Botulism toxin does not cure chronic migraine and it may not be effective in some patients.  The administration of botulism toxin is accomplished by injecting a small amount of toxin into the muscles of the neck and head. Dosage must be titrated for each individual. Any benefits resulting from botulism toxin tend to wear off after 3 months with a repeat injection required if benefit is to be maintained. Injections are usually done every 3-4 months with maximum effect peak achieved by about 2 or 3 weeks. Botulism toxin is expensive and you  should be sure of what costs you will incur resulting from the injection.  The side effects of botulism toxin use for chronic migraine may include:   -Transient, and usually mild, facial weakness with facial injections  -Transient, and usually mild, head or neck weakness with head/neck injections  -Reduction or loss of forehead facial animation due to forehead muscle weakness  -Eyelid drooping  -Dry eye  -Pain at the site of injection or bruising at the site of injection  -Double vision  -Potential unknown long term risks  Contraindications: You should not have Botox if you are pregnant, nursing, allergic to albumin, have an infection, skin condition, or muscle weakness at the site of the injection, or have myasthenia gravis, Lambert-Eaton syndrome, or ALS.  It is also possible that as with any injection, there may be an allergic reaction or no effect from the medication. Reduced effectiveness after repeated injections is sometimes seen and rarely infection at the injection site may occur. All care will be taken to prevent these side effects. If therapy is given over a long time, atrophy and wasting in the muscle injected may occur. Occasionally the patient's become refractory to treatment because they develop antibodies to the toxin. In this event, therapy needs to be modified.  I have read the above information and consent to the administration of botulism toxin.    BOTOX PROCEDURE NOTE FOR MIGRAINE HEADACHE    Contraindications and precautions discussed with patient(above). Aseptic procedure was observed and patient tolerated procedure. Procedure performed by Dr. Georgia Dom  The condition has  existed for more than 6 months, and pt does not have a diagnosis of ALS, Myasthenia Gravis or Lambert-Eaton Syndrome.  Risks and benefits of injections discussed and pt agrees to proceed with the procedure.  Written consent obtained  These injections are medically necessary. Pt  receives good  benefits from these injections. These injections do not cause sedations or hallucinations which the oral therapies may cause.  Description of procedure:  The patient was placed in a sitting position. The standard protocol was used for Botox as follows, with 5 units of Botox injected at each site:   -Procerus muscle, midline injection  -Corrugator muscle, bilateral injection  -Frontalis muscle, bilateral injection, with 2 sites each side, medial injection was performed in the upper one third of the frontalis muscle, in the region vertical from the medial inferior edge of the superior orbital rim. The lateral injection was again in the upper one third of the forehead vertically above the lateral limbus of the cornea, 1.5 cm lateral to the medial injection site.  - Levator Scapulae: 5 units bilaterally  -Temporalis muscle injection, 5 sites, bilaterally. The first injection was 3 cm above the tragus of the ear, second injection site was 1.5 cm to 3 cm up from the first injection site in line with the tragus of the ear. The third injection site was 1.5-3 cm forward between the first 2 injection sites. The fourth injection site was 1.5 cm posterior to the second injection site. 5th site laterally in the temporalis  muscleat the level of the outer canthus.  - Patient feels the migraines are centered around the eyes +5 units bilaterally at the outer canthus in the orbicularis occuli  -Occipitalis muscle injection, 3 sites, bilaterally. The first injection was done one half way between the occipital protuberance and the tip of the mastoid process behind the ear. The second injection site was done lateral and superior to the first, 1 fingerbreadth from the first injection. The third injection site was 1 fingerbreadth superiorly and medially from the first injection site.  -Cervical paraspinal muscle injection, 2 sites, bilateral knee first injection site was 1 cm from the midline of the cervical spine, 3  cm inferior to the lower border of the occipital protuberance. The second injection site was 1.5 cm superiorly and laterally to the first injection site.  -Trapezius muscle injection was performed at 3 sites, bilaterally. The first injection site was in the upper trapezius muscle halfway between the inflection point of the neck, and the acromion. The second injection site was one half way between the acromion and the first injection site. The third injection was done between the first injection site and the inflection point of the neck.   Will return for repeat injection in 3 months.   A 200 unit sof Botox was used, any Botox not injected was wasted. The patient tolerated the procedure well, there were no complications of the above procedure.

## 2020-04-07 ENCOUNTER — Ambulatory Visit: Payer: BC Managed Care – PPO | Attending: Family

## 2020-04-07 DIAGNOSIS — Z23 Encounter for immunization: Secondary | ICD-10-CM

## 2020-04-07 NOTE — Progress Notes (Signed)
   Covid-19 Vaccination Clinic  Name:  Jennifer Sims    MRN: [id number] DOB: [birth year]  04/07/2020  Jennifer Sims was observed post Covid-19 immunization for 15 minutes without incident. She was provided with Vaccine Information Sheet and instruction to access the V-Safe system.   Jennifer Sims was instructed to call 911 with any severe reactions post vaccine: Marland Kitchen Difficulty breathing  . Swelling of face and throat  . A fast heartbeat  . A bad rash all over body  . Dizziness and weakness   Immunizations Administered    Name Date Dose VIS Date Route   Moderna COVID-19 Vaccine 04/07/2020  9:55 AM 0.5 mL 11/26/2019 Intramuscular   Manufacturer: Moderna   Lot: [id number]   [place]: [id number]

## 2020-05-18 ENCOUNTER — Other Ambulatory Visit: Payer: Self-pay | Admitting: Internal Medicine

## 2020-05-18 DIAGNOSIS — M458 Ankylosing spondylitis sacral and sacrococcygeal region: Secondary | ICD-10-CM

## 2020-06-01 ENCOUNTER — Other Ambulatory Visit: Payer: Self-pay

## 2020-06-01 ENCOUNTER — Ambulatory Visit
Admission: RE | Admit: 2020-06-01 | Discharge: 2020-06-01 | Disposition: A | Discharge status: Home / Self Care | Payer: BC Managed Care – PPO | Source: Ambulatory Visit | Attending: Internal Medicine | Admitting: Internal Medicine

## 2020-06-01 DIAGNOSIS — M458 Ankylosing spondylitis sacral and sacrococcygeal region: Secondary | ICD-10-CM | POA: Diagnosis not present

## 2020-06-30 ENCOUNTER — Ambulatory Visit: Payer: BC Managed Care – PPO | Admitting: Neurology

## 2020-07-06 NOTE — Progress Notes (Signed)
Interval history 07/07/2020: >95% improvement in frequency and severity of migraines.   Interval history 04/02/2020: >95% improvement on botox, frequency migraines. Wil try the new medications, acute. Jennifer Sims works best.. Resend to dry needling, she loved Donnetta Simpers will reuest for cervical myofascial pain syndrome. 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 Hanley Seamen her a few bio freeze to try, she said the biofreeze worked.  Interval history 05/29/2019: sill doing well on botox. She had 100% relief. Has had a few migraines as we are late on botox theray due to covid19.   Interval history 02/19/2019: Not one headache since last botox, remarkable improvement on botox. +temples and oo. No masseters. +traps and +levators. She loved the dry needling, [place] on [address]. She had rebound from imitrex, ibuprofen and other meds. She is on Emgality and botox. She has not had to use any acute management meds. She had an allergic reaction to ajovy. Patient's neck has worsened, acute cervical strain on chronic myofascial cervical pain. She did great with dry needling will refer again and she is now doing pilates and doin well.   She had eye lower lid surgery Dr. Freddi Starr, saw .Marland Kitchen..doing well.      Consent Form Botulism Toxin Injection For Chronic Migraine    Reviewed orally with patient, additionally signature is on file:  Botulism toxin has been approved by the Federal drug administration for treatment of chronic migraine. Botulism toxin does not cure chronic migraine and it may not be effective in some patients.  The administration of botulism toxin is accomplished by injecting a small amount of toxin into the muscles of the neck and head. Dosage must be titrated for each individual. Any benefits resulting from botulism toxin tend to wear off after 3 months with a repeat injection required if benefit is to be maintained. Injections are usually done every 3-4 months with  maximum effect peak achieved by about 2 or 3 weeks. Botulism toxin is expensive and you should be sure of what costs you will incur resulting from the injection.  The side effects of botulism toxin use for chronic migraine may include:   -Transient, and usually mild, facial weakness with facial injections  -Transient, and usually mild, head or neck weakness with head/neck injections  -Reduction or loss of forehead facial animation due to forehead muscle weakness  -Eyelid drooping  -Dry eye  -Pain at the site of injection or bruising at the site of injection  -Double vision  -Potential unknown long term risks  Contraindications: You should not have Botox if you are pregnant, nursing, allergic to albumin, have an infection, skin condition, or muscle weakness at the site of the injection, or have myasthenia gravis, Lambert-Eaton syndrome, or ALS.  It is also possible that as with any injection, there may be an allergic reaction or no effect from the medication. Reduced effectiveness after repeated injections is sometimes seen and rarely infection at the injection site may occur. All care will be taken to prevent these side effects. If therapy is given over a long time, atrophy and wasting in the muscle injected may occur. Occasionally the patient's become refractory to treatment because they develop antibodies to the toxin. In this event, therapy needs to be modified.  I have read the above information and consent to the administration of botulism toxin.    BOTOX PROCEDURE NOTE FOR MIGRAINE HEADACHE    Contraindications and precautions discussed with patient(above). Aseptic procedure was observed and  patient tolerated procedure. Procedure performed by Dr. Georgia Dom  The condition has existed for more than 6 months, and pt does not have a diagnosis of ALS, Myasthenia Gravis or Lambert-Eaton Syndrome.  Risks and benefits of injections discussed and pt agrees to proceed with the procedure.   Written consent obtained  These injections are medically necessary. Pt  receives good benefits from these injections. These injections do not cause sedations or hallucinations which the oral therapies may cause.  Description of procedure:  The patient was placed in a sitting position. The standard protocol was used for Botox as follows, with 5 units of Botox injected at each site:   -Procerus muscle, midline injection  -Corrugator muscle, bilateral injection  -Frontalis muscle, bilateral injection, with 2 sites each side, medial injection was performed in the upper one third of the frontalis muscle, in the region vertical from the medial inferior edge of the superior orbital rim. The lateral injection was again in the upper one third of the forehead vertically above the lateral limbus of the cornea, 1.5 cm lateral to the medial injection site.  - Levator Scapulae: 5 units bilaterally  -Temporalis muscle injection, 5 sites, bilaterally. The first injection was 3 cm above the tragus of the ear, second injection site was 1.5 cm to 3 cm up from the first injection site in line with the tragus of the ear. The third injection site was 1.5-3 cm forward between the first 2 injection sites. The fourth injection site was 1.5 cm posterior to the second injection site. 5th site laterally in the temporalis  muscleat the level of the outer canthus.  - Patient feels the migraines are centered around the eyes +5 units bilaterally at the outer canthus in the orbicularis occuli  -Occipitalis muscle injection, 3 sites, bilaterally. The first injection was done one half way between the occipital protuberance and the tip of the mastoid process behind the ear. The second injection site was done lateral and superior to the first, 1 fingerbreadth from the first injection. The third injection site was 1 fingerbreadth superiorly and medially from the first injection site.  -Cervical paraspinal muscle injection, 2  sites, bilateral knee first injection site was 1 cm from the midline of the cervical spine, 3 cm inferior to the lower border of the occipital protuberance. The second injection site was 1.5 cm superiorly and laterally to the first injection site.  -Trapezius muscle injection was performed at 3 sites, bilaterally. The first injection site was in the upper trapezius muscle halfway between the inflection point of the neck, and the acromion. The second injection site was one half way between the acromion and the first injection site. The third injection was done between the first injection site and the inflection point of the neck.   Will return for repeat injection in 3 months.   A 200 unit sof Botox was used, any Botox not injected was wasted. The patient tolerated the procedure well, there were no complications of the above procedure.

## 2020-07-07 ENCOUNTER — Ambulatory Visit (INDEPENDENT_AMBULATORY_CARE_PROVIDER_SITE_OTHER): Payer: BC Managed Care – PPO | Admitting: Neurology

## 2020-07-07 DIAGNOSIS — G43711 Chronic migraine without aura, intractable, with status migrainosus: Secondary | ICD-10-CM

## 2020-07-07 NOTE — Progress Notes (Signed)
Botox- 100 units x 2 vials Lot: [id number]  Expiration: 06/2022 NDC: 4301-4840-39  Bacteriostatic 0.9% Sodium Chloride- 56m total Lot: [id number]: 09/25/2021 NDC: [address]-49 Dx: [id number]/P

## 2020-08-11 ENCOUNTER — Telehealth: Payer: Self-pay | Admitting: Neurology

## 2020-08-11 NOTE — Telephone Encounter (Signed)
Patient's next Botox appointment is 10/26. Today I received (2) 100U vials of Botox from [facility] for this appointment.

## 2020-08-27 DIAGNOSIS — K5080 Crohn's disease of both small and large intestine without complications: Secondary | ICD-10-CM | POA: Insufficient documentation

## 2020-09-15 DIAGNOSIS — K5903 Drug induced constipation: Secondary | ICD-10-CM | POA: Insufficient documentation

## 2020-10-01 ENCOUNTER — Other Ambulatory Visit: Payer: Self-pay

## 2020-10-01 ENCOUNTER — Ambulatory Visit: Payer: BC Managed Care – PPO | Admitting: Adult Health

## 2020-10-01 ENCOUNTER — Encounter: Payer: Self-pay | Admitting: Adult Health

## 2020-10-01 VITALS — BP 129/88 | HR 44 | Ht 62.0 in | Wt 141.0 lb

## 2020-10-01 DIAGNOSIS — G43709 Chronic migraine without aura, not intractable, without status migrainosus: Secondary | ICD-10-CM

## 2020-10-01 NOTE — Progress Notes (Addendum)
PATIENT: Jennifer Sims DOB: 01-21-1966  REASON FOR VISIT: follow up HISTORY FROM: patient  HISTORY OF PRESENT ILLNESS: Today 10/01/20:  Jennifer Sims is a 54 year old female with a history of migraine headaches.  She is getting Botox every 3 months.  She reports that this has been beneficial for headaches.  Reports approximately 1 headache a month.  Typically when she does get a headache she is able to sleep it off.  She states that her triggers are food with MSG and sleep deprivation.  She returns today for an evaluation.  HISTORY  03/30/20: Retirement home issue Emgality 7 has her headaches although yet follow-up in a month's time you think you are having mom will I had them when I was after I ate food with MSG high AA I do have all 1 last night to sleep  Jennifer Sims is a 54 year old female with a history of migraine headaches.  She is currently taking Botox, Emgality for preventative treatment and Ubrelvy as a rescue medication.  She reports that she has 1-2 migraines a week.  Now insurance will not cover Botox if she is on [place].  Patient prefers to stay on Botox   HISTORY 12/24/2019: >95% improvement on botox, frequency migraines. Wil try the new medications, acute.Roselyn Meier works best. Also Nurtec. Resend to dry needling, she loved Donnetta Simpers will reuest for cervical myofascial pain syndrome. She has 2 kids 27 and 80. 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 Hanley Seamen her a few bio freeze to try.  REVIEW OF SYSTEMS: Out of a complete 14 system review of symptoms, the patient complains only of the following symptoms, and all other reviewed systems are negative.  See HPI  ALLERGIES: Allergies  Allergen Reactions  . Sulfur Nausea Only    HOME MEDICATIONS: Outpatient Medications Prior to Visit  Medication Sig Dispense Refill  . Adalimumab (HUMIRA [place]) Inject into the skin. 1 shot every 2 weeks.    . bisoprolol-hydrochlorothiazide  (ZIAC) 5-6.25 MG tablet Take 1 tablet by mouth daily.    Marland Kitchen BOTOX 100 units SOLR injection INJECT  IN THE HEAD AND NECK MUSCLES AS DIRECTED. 2 each 1  . Cholecalciferol (VITAMIN D3) 1000 units CAPS Take 1 capsule by mouth daily.    Marland Kitchen docusate sodium (COLACE) 100 MG capsule Take 100 mg by mouth daily.    . finasteride (PROSCAR) 5 MG tablet Take 5 mg by mouth daily.    . hydroquinone 4 % cream Apply topically daily.    . Multiple Vitamins-Minerals (MULTIVITAMIN ADULTS PO) Take 1 tablet by mouth daily.    . Tretinoin-Cleanser-Moisturizer 0.025 % CREAM KIT Apply topically daily.    . vitamin B-12 (CYANOCOBALAMIN) 1000 MCG tablet Take 1,000 mcg by mouth daily.    . indomethacin (INDOCIN) 25 MG capsule Take 1-2 capsules (25-66m) capsules up to three times a day. Maximum 200 mg per day. 60 capsule 6  . UNABLE TO FIND Take 1 tablet by mouth daily. Med Name: 2.4 gm  Lialda     No facility-administered medications prior to visit.    PAST MEDICAL HISTORY: Past Medical History:  Diagnosis Date  . [place]'s disease [place]   . Migraine   . Osteopenia     PAST SURGICAL HISTORY: Past Surgical History:  Procedure Laterality Date  . CHOLECYSTECTOMY  2009    FAMILY HISTORY: Family History  Problem Relation Age of Onset  . Diabetes Mellitus II Mother   . Hypertension Mother   .  Asthma Father   . Osteoporosis Maternal Grandmother     SOCIAL HISTORY: Social History   Socioeconomic History  . Marital status: Married    Spouse name: [name]  . Number of children: 2  . Years of education: PHD JD  . Highest education level: Not on file  Occupational History  . Occupation: University   Tobacco Use  . Smoking status: Never Smoker  . Smokeless tobacco: Never Used  Substance and Sexual Activity  . Alcohol use: Yes    Comment: 1 drink per month  . Drug use: No  . Sexual activity: Not on file  Other Topics Concern  . Not on file  Social History Narrative   Lives with husband and children    Caffeine use: 1 cup tea per day   Social Determinants of Health   Financial Resource Strain:   . Difficulty of Paying Living Expenses: Not on file  Food Insecurity:   . Worried About Charity fundraiser in the Last Year: Not on file  . Ran Out of Food in the Last Year: Not on file  Transportation Needs:   . Lack of Transportation (Medical): Not on file  . Lack of Transportation (Non-Medical): Not on file  Physical Activity:   . Days of Exercise per Week: Not on file  . Minutes of Exercise per Session: Not on file  Stress:   . Feeling of Stress : Not on file  Social Connections:   . Frequency of Communication with Friends and Family: Not on file  . Frequency of Social Gatherings with Friends and Family: Not on file  . Attends Religious Services: Not on file  . Active Member of Clubs or Organizations: Not on file  . Attends Archivist Meetings: Not on file  . Marital Status: Not on file  Intimate Partner Violence:   . Fear of Current or Ex-Partner: Not on file  . Emotionally Abused: Not on file  . Physically Abused: Not on file  . Sexually Abused: Not on file      PHYSICAL EXAM  Vitals:   10/01/20 1321  BP: 129/88  Pulse: (!) 44  Weight: 141 lb (64 kg)  Height: 5' 2"  (1.575 m)   Body mass index is 25.79 kg/m.  Generalized: Well developed, in no acute distress   Neurological examination  Mentation: Alert oriented to time, place, history taking. Follows all commands speech and language fluent Cranial nerve II-XII: Pupils were equal round reactive to light. Extraocular movements were full, visual field were full on confrontational test. . Head turning and shoulder shrug  were normal and symmetric. Motor: The motor testing reveals 5 over 5 strength of all 4 extremities. Good symmetric motor tone is noted throughout.  Sensory: Sensory testing is intact to soft touch on all 4 extremities. No evidence of extinction is noted.  Coordination: Cerebellar testing  reveals good finger-nose-finger and heel-to-shin bilaterally.  Gait and station: Gait is normal. Tandem gait is normal. Romberg is negative. No drift is seen.  Reflexes: Deep tendon reflexes are symmetric and normal bilaterally.   DIAGNOSTIC DATA (LABS, IMAGING, TESTING) - I reviewed patient records, labs, notes, testing and imaging myself where available.  Lab Results  Component Value Date   WBC 7.4 08/06/2007   HGB 11.6 (L) 08/06/2007   HCT 34.9 (L) 08/06/2007   MCV 86.8 08/06/2007   PLT 241 08/06/2007      Component Value Date/Time   NA 135 08/06/2007 1115   K 3.8 08/06/2007 1115  CL 104 08/06/2007 1115   CO2 28 08/06/2007 1115   GLUCOSE 90 08/06/2007 1115   BUN 3 DELTA CHECK NOTED (L) 08/06/2007 1115   CREATININE 0.71 08/06/2007 1115   CALCIUM 8.5 08/06/2007 1115   PROT 5.8 (L) 08/06/2007 1115   ALBUMIN 2.6 (L) 08/06/2007 1115   AST 29 08/06/2007 1115   ALT 18 08/06/2007 1115   ALKPHOS 89 08/06/2007 1115   BILITOT 0.6 08/06/2007 1115   GFRNONAA >60 08/06/2007 1115   GFRAA  08/06/2007 1115    >60        The eGFR has been calculated using the MDRD equation. This calculation has not been validated in all clinical      ASSESSMENT AND PLAN 53 y.o. year old female  has a past medical history of Crohn's disease ([place]), Migraine, and Osteopenia. here with:  1.  Migraine headache  -Continue Botox every 3 months -Advised if headache frequency worsen she should let us know    I spent 25 minutes of face-to-face and non-face-to-face time with patient.  This included previsit chart review, lab review, study review, order entry, electronic health record documentation, patient education.  Ward Givens, MSN, NP-C 10/01/2020, 1:41 PM [facility] [address] [place], [place] [address] [phone]  Made any corrections needed, and agree with procedure  Sarina Ill, MD [facility]

## 2020-10-01 NOTE — Patient Instructions (Signed)
Your Plan:  Continue botox     Thank you for coming to see [place] at [facility]. I hope we have been able to provide you high quality care today.  You may receive a patient satisfaction survey over the next few weeks. We would appreciate your feedback and comments so that we may continue to improve ourselves and the health of our patients.

## 2020-10-20 ENCOUNTER — Ambulatory Visit: Payer: BC Managed Care – PPO | Admitting: Neurology

## 2020-10-20 ENCOUNTER — Other Ambulatory Visit: Payer: Self-pay

## 2020-10-20 DIAGNOSIS — G43711 Chronic migraine without aura, intractable, with status migrainosus: Secondary | ICD-10-CM

## 2020-10-20 NOTE — Progress Notes (Signed)
Botox- 100 units x 2 vials Lot: [id number] Expiration: 10/2022 NDC: 5461-2432-75  Bacteriostatic 0.9% Sodium Chloride- 65m total Lot: [id number]: 01/26/2022 NDC: [address]-87 Dx: [id number]/P

## 2020-10-20 NOTE — Progress Notes (Signed)
Interval history 10/20/2020: She rarely has a headache, can;t remember when she had one last, +orb oculi, no masseters, no temples, +paraspinals around T2-T4 for pain there +5 bilat extra. NO CGRP. Was on Ajovy* but insurance would not pay for both, doing great on botox alone however. 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 Husband is radiology.   Interval history 07/07/2020: >95% improvement in frequency and severity of migraines.   Interval history 04/02/2020: >95% improvement on botox, frequency migraines. Wil try the new medications, acute. Roselyn Meier works best.. Resend to dry needling, she loved Donnetta Simpers will reuest for cervical myofascial pain syndrome. She has 2 kids 68 and 24. 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 Hanley Seamen her a few bio freeze to try, she said the biofreeze worked.  Interval history 05/29/2019: sill doing well on botox. She had 100% relief. Has had a few migraines as we are late on botox theray due to covid19.   Interval history 02/19/2019: Not one headache since last botox, remarkable improvement on botox. +temples and oo. No masseters. +traps and +levators. She loved the dry needling, [place] on [address]. She had rebound from imitrex, ibuprofen and other meds. She is on Emgality and botox. She has not had to use any acute management meds. She had an allergic reaction to ajovy. Patient's neck has worsened, acute cervical strain on chronic myofascial cervical pain. She did great with dry needling will refer again and she is now doing pilates and doin well.   She had eye lower lid surgery Dr. Freddi Starr, saw .Marland Kitchen..doing well.      Consent Form Botulism Toxin Injection For Chronic Migraine    Reviewed orally with patient, additionally signature is on file:  Botulism toxin has been approved by the Federal drug administration for treatment of chronic migraine. Botulism toxin does not cure chronic migraine and it may not be  effective in some patients.  The administration of botulism toxin is accomplished by injecting a small amount of toxin into the muscles of the neck and head. Dosage must be titrated for each individual. Any benefits resulting from botulism toxin tend to wear off after 3 months with a repeat injection required if benefit is to be maintained. Injections are usually done every 3-4 months with maximum effect peak achieved by about 2 or 3 weeks. Botulism toxin is expensive and you should be sure of what costs you will incur resulting from the injection.  The side effects of botulism toxin use for chronic migraine may include:   -Transient, and usually mild, facial weakness with facial injections  -Transient, and usually mild, head or neck weakness with head/neck injections  -Reduction or loss of forehead facial animation due to forehead muscle weakness  -Eyelid drooping  -Dry eye  -Pain at the site of injection or bruising at the site of injection  -Double vision  -Potential unknown long term risks  Contraindications: You should not have Botox if you are pregnant, nursing, allergic to albumin, have an infection, skin condition, or muscle weakness at the site of the injection, or have myasthenia gravis, Lambert-Eaton syndrome, or ALS.  It is also possible that as with any injection, there may be an allergic reaction or no effect from the medication. Reduced effectiveness after repeated injections is sometimes seen and rarely infection at the injection site may occur. All care will be taken to prevent these side effects. If therapy is given over a long time, atrophy  and wasting in the muscle injected may occur. Occasionally the patient's become refractory to treatment because they develop antibodies to the toxin. In this event, therapy needs to be modified.  I have read the above information and consent to the administration of botulism toxin.    BOTOX PROCEDURE NOTE FOR MIGRAINE  HEADACHE    Contraindications and precautions discussed with patient(above). Aseptic procedure was observed and patient tolerated procedure. Procedure performed by Dr. Georgia Dom  The condition has existed for more than 6 months, and pt does not have a diagnosis of ALS, Myasthenia Gravis or Lambert-Eaton Syndrome.  Risks and benefits of injections discussed and pt agrees to proceed with the procedure.  Written consent obtained  These injections are medically necessary. Pt  receives good benefits from these injections. These injections do not cause sedations or hallucinations which the oral therapies may cause.  Description of procedure:  The patient was placed in a sitting position. The standard protocol was used for Botox as follows, with 5 units of Botox injected at each site:   -Procerus muscle, midline injection  -Corrugator muscle, bilateral injection  -Frontalis muscle, bilateral injection, with 2 sites each side, medial injection was performed in the upper one third of the frontalis muscle, in the region vertical from the medial inferior edge of the superior orbital rim. The lateral injection was again in the upper one third of the forehead vertically above the lateral limbus of the cornea, 1.5 cm lateral to the medial injection site.  - Levator Scapulae: 5 units bilaterally  -Temporalis muscle injection, 5 sites, bilaterally. The first injection was 3 cm above the tragus of the ear, second injection site was 1.5 cm to 3 cm up from the first injection site in line with the tragus of the ear. The third injection site was 1.5-3 cm forward between the first 2 injection sites. The fourth injection site was 1.5 cm posterior to the second injection site. 5th site laterally in the temporalis  muscleat the level of the outer canthus.  - Patient feels the migraines are centered around the eyes +5 units bilaterally at the outer canthus in the orbicularis occuli  -Occipitalis muscle injection,  3 sites, bilaterally. The first injection was done one half way between the occipital protuberance and the tip of the mastoid process behind the ear. The second injection site was done lateral and superior to the first, 1 fingerbreadth from the first injection. The third injection site was 1 fingerbreadth superiorly and medially from the first injection site.  -Cervical paraspinal muscle injection, 2 sites, bilateral knee first injection site was 1 cm from the midline of the cervical spine, 3 cm inferior to the lower border of the occipital protuberance. The second injection site was 1.5 cm superiorly and laterally to the first injection site.  -Trapezius muscle injection was performed at 3 sites, bilaterally. The first injection site was in the upper trapezius muscle halfway between the inflection point of the neck, and the acromion. The second injection site was one half way between the acromion and the first injection site. The third injection was done between the first injection site and the inflection point of the neck.   Will return for repeat injection in 3 months.   A 200 unit sof Botox was used, any Botox not injected was wasted. The patient tolerated the procedure well, there were no complications of the above procedure.

## 2020-11-03 ENCOUNTER — Telehealth: Payer: Self-pay | Admitting: Neurology

## 2020-11-03 NOTE — Telephone Encounter (Signed)
Patient's next Botox appointment is 01/26/21. I received a fax from [facility] stating that patient's Botox will be delivered 11/10.

## 2020-11-04 NOTE — Telephone Encounter (Signed)
Received (2) 100U vials of Botox today from [facility].

## 2021-01-26 ENCOUNTER — Other Ambulatory Visit: Payer: Self-pay

## 2021-01-26 ENCOUNTER — Other Ambulatory Visit: Payer: Self-pay | Admitting: *Deleted

## 2021-01-26 ENCOUNTER — Ambulatory Visit: Payer: BC Managed Care – PPO | Admitting: Neurology

## 2021-01-26 ENCOUNTER — Telehealth: Payer: Self-pay | Admitting: Neurology

## 2021-01-26 DIAGNOSIS — G43711 Chronic migraine without aura, intractable, with status migrainosus: Secondary | ICD-10-CM | POA: Diagnosis not present

## 2021-01-26 MED ORDER — BOTOX 100 UNITS IJ SOLR: INTRAMUSCULAR | 1 refills | Status: DC

## 2021-01-26 NOTE — Telephone Encounter (Signed)
Patient has a Botox appointment today. She has (2) 100U vials of Botox already here. Today I received 2 more 100U vials of Botox from [facility] for her next appointment around May.

## 2021-01-26 NOTE — Progress Notes (Signed)
Interval history: No changes  Interval history 10/20/2020: She rarely has a headache, can;t remember when she had one last, +orb oculi, no masseters, no temples, +paraspinals around T2-T4 for pain there +5 bilat extra. NO CGRP. Was on Ajovy* but insurance would not pay for both, doing great on botox alone however. 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 Husband is radiology.   Interval history 07/07/2020: >95% improvement in frequency and severity of migraines.   Interval history 04/02/2020: >95% improvement on botox, frequency migraines. Wil try the new medications, acute. Jennifer Sims works best.. Resend to dry needling, she loved Donnetta Simpers will reuest for cervical myofascial pain syndrome. She has 2 kids 62 and 65. 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 Hanley Seamen her a few bio freeze to try, she said the biofreeze worked.  Interval history 05/29/2019: sill doing well on botox. She had 100% relief. Has had a few migraines as we are late on botox theray due to covid19.   Interval history 02/19/2019: Not one headache since last botox, remarkable improvement on botox. +temples and oo. No masseters. +traps and +levators. She loved the dry needling, [place] [address]. She had rebound from imitrex, ibuprofen and other meds. She is on Emgality and botox. She has not had to use any acute management meds. She had an allergic reaction to ajovy. Patient's neck has worsened, acute cervical strain on chronic myofascial cervical pain. She did great with dry needling will refer again and she is now doing pilates and doin well.   She had eye lower lid surgery Dr. Freddi Starr, saw .Marland Kitchen..doing well.      Consent Form Botulism Toxin Injection For Chronic Migraine    Reviewed orally with patient, additionally signature is on file:  Botulism toxin has been approved by the Federal drug administration for treatment of chronic migraine. Botulism toxin does not cure chronic  migraine and it may not be effective in some patients.  The administration of botulism toxin is accomplished by injecting a small amount of toxin into the muscles of the neck and head. Dosage must be titrated for each individual. Any benefits resulting from botulism toxin tend to wear off after 3 months with a repeat injection required if benefit is to be maintained. Injections are usually done every 3-4 months with maximum effect peak achieved by about 2 or 3 weeks. Botulism toxin is expensive and you should be sure of what costs you will incur resulting from the injection.  The side effects of botulism toxin use for chronic migraine may include:   -Transient, and usually mild, facial weakness with facial injections  -Transient, and usually mild, head or neck weakness with head/neck injections  -Reduction or loss of forehead facial animation due to forehead muscle weakness  -Eyelid drooping  -Dry eye  -Pain at the site of injection or bruising at the site of injection  -Double vision  -Potential unknown long term risks  Contraindications: You should not have Botox if you are pregnant, nursing, allergic to albumin, have an infection, skin condition, or muscle weakness at the site of the injection, or have myasthenia gravis, Lambert-Eaton syndrome, or ALS.  It is also possible that as with any injection, there may be an allergic reaction or no effect from the medication. Reduced effectiveness after repeated injections is sometimes seen and rarely infection at the injection site may occur. All care will be taken to prevent these side effects. If therapy is given  over a long time, atrophy and wasting in the muscle injected may occur. Occasionally the patient's become refractory to treatment because they develop antibodies to the toxin. In this event, therapy needs to be modified.  I have read the above information and consent to the administration of botulism toxin.    BOTOX PROCEDURE NOTE FOR  MIGRAINE HEADACHE    Contraindications and precautions discussed with patient(above). Aseptic procedure was observed and patient tolerated procedure. Procedure performed by Dr. Georgia Dom  The condition has existed for more than 6 months, and pt does not have a diagnosis of ALS, Myasthenia Gravis or Lambert-Eaton Syndrome.  Risks and benefits of injections discussed and pt agrees to proceed with the procedure.  Written consent obtained  These injections are medically necessary. Pt  receives good benefits from these injections. These injections do not cause sedations or hallucinations which the oral therapies may cause.  Description of procedure:  The patient was placed in a sitting position. The standard protocol was used for Botox as follows, with 5 units of Botox injected at each site:   -Procerus muscle, midline injection  -Corrugator muscle, bilateral injection  -Frontalis muscle, bilateral injection, with 2 sites each side, medial injection was performed in the upper one third of the frontalis muscle, in the region vertical from the medial inferior edge of the superior orbital rim. The lateral injection was again in the upper one third of the forehead vertically above the lateral limbus of the cornea, 1.5 cm lateral to the medial injection site.  - Levator Scapulae: 5 units bilaterally  -Temporalis muscle injection, 5 sites, bilaterally. The first injection was 3 cm above the tragus of the ear, second injection site was 1.5 cm to 3 cm up from the first injection site in line with the tragus of the ear. The third injection site was 1.5-3 cm forward between the first 2 injection sites. The fourth injection site was 1.5 cm posterior to the second injection site. 5th site laterally in the temporalis  muscleat the level of the outer canthus.  - Patient feels the migraines are centered around the eyes +5 units bilaterally at the outer canthus in the orbicularis occuli  -Occipitalis muscle  injection, 3 sites, bilaterally. The first injection was done one half way between the occipital protuberance and the tip of the mastoid process behind the ear. The second injection site was done lateral and superior to the first, 1 fingerbreadth from the first injection. The third injection site was 1 fingerbreadth superiorly and medially from the first injection site.  -Cervical paraspinal muscle injection, 2 sites, bilateral knee first injection site was 1 cm from the midline of the cervical spine, 3 cm inferior to the lower border of the occipital protuberance. The second injection site was 1.5 cm superiorly and laterally to the first injection site.  -Trapezius muscle injection was performed at 3 sites, bilaterally. The first injection site was in the upper trapezius muscle halfway between the inflection point of the neck, and the acromion. The second injection site was one half way between the acromion and the first injection site. The third injection was done between the first injection site and the inflection point of the neck.   Will return for repeat injection in 3 months.   A 200 unit sof Botox was used, any Botox not injected was wasted. The patient tolerated the procedure well, there were no complications of the above procedure.

## 2021-01-26 NOTE — Progress Notes (Signed)
Botox- 100 units x 2 vials Lot: [id number] Expiration: 02/2023 NDC: 2883-3744-51  Bacteriostatic 0.9% Sodium Chloride- 36m total Lot: [id number]: 01/26/2022 NDC: [address]-27 Dx: [id number]/P

## 2021-02-02 ENCOUNTER — Telehealth: Payer: Self-pay | Admitting: Neurology

## 2021-02-02 NOTE — Telephone Encounter (Signed)
Patient's next Botox appointment is 5/3. Her PA with [employer] will expire on 03/24/21. I filled out continuation form and will give to MD to sign.  Patient has a PA on file with [facility] for pharmacy benefit that expires 07/13/21.

## 2021-02-10 NOTE — Telephone Encounter (Signed)
Faxed signed PA form to [facility] with notes.

## 2021-02-17 NOTE — Telephone Encounter (Signed)
I called [employer] and spoke with Marshelle to check PA status. Renee Harder states that she does not see that they received the fax I submitted on 2/16. She advised me to try faxing it again. When our call completed, I faxed the PA request to [facility] at [phone].

## 2021-02-24 NOTE — Telephone Encounter (Signed)
I called [employer] again today and spoke with Gwyndolyn Saxon. He states that generally it is better to send in a PA request closer to the expiration date. He advised me to resubmit the request closer to 3/30.

## 2021-03-22 NOTE — Telephone Encounter (Signed)
Faxed [employer] PA form today with notes.

## 2021-03-23 NOTE — Telephone Encounter (Signed)
Received approval from [facility] via fax. Reference #[id number] (03/22/21- 03/21/22).

## 2021-04-08 ENCOUNTER — Telehealth: Payer: Self-pay | Admitting: Neurology

## 2021-04-08 NOTE — Telephone Encounter (Signed)
Patient has a Botox appointment 5/3. She has (2) 100 unit vials of Botox here already that arrived in February. Received call from [place] to schedule delivery of a second Botox shipment. That shipment will arrive 4/19.

## 2021-04-13 NOTE — Telephone Encounter (Signed)
Received (2) 100 unit vials of Botox today from [place].

## 2021-04-27 ENCOUNTER — Ambulatory Visit: Payer: Self-pay | Admitting: Neurology

## 2021-04-27 NOTE — Telephone Encounter (Signed)
Called patient and LVM to reschedule due to MD out.

## 2021-04-27 NOTE — Telephone Encounter (Signed)
Patient returned my call and we rescheduled her appointment to 5/5 at 1:30.

## 2021-04-29 ENCOUNTER — Ambulatory Visit: Payer: BC Managed Care – PPO | Admitting: Neurology

## 2021-04-29 ENCOUNTER — Other Ambulatory Visit: Payer: Self-pay

## 2021-04-29 DIAGNOSIS — G43711 Chronic migraine without aura, intractable, with status migrainosus: Secondary | ICD-10-CM

## 2021-04-29 NOTE — Progress Notes (Signed)
Interval history5/04/2021: No changes  Interval history 10/20/2020: She rarely has a headache, can;t remember when she had one last, +orb oculi, no masseters, no temples, +paraspinals around T2-T4 for pain there +5 bilat extra. NO CGRP. Was on Ajovy* but insurance would not pay for both, doing great on botox alone however. 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 Husband is radiology.   Interval history 07/07/2020: >95% improvement in frequency and severity of migraines.   Interval history 04/02/2020: >95% improvement on botox, frequency migraines. Wil try the new medications, acute. Roselyn Meier works best.. Resend to dry needling, she loved Donnetta Simpers will reuest for cervical myofascial pain syndrome. She has 2 kids 48 and 47. 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 Hanley Seamen her a few bio freeze to try, she said the biofreeze worked.  Interval history 05/29/2019: sill doing well on botox. She had 100% relief. Has had a few migraines as we are late on botox theray due to covid19.   Interval history 02/19/2019: Not one headache since last botox, remarkable improvement on botox. +temples and oo. No masseters. +traps and +levators. She loved the dry needling, [place] on [address]. She had rebound from imitrex, ibuprofen and other meds. She is on Emgality and botox. She has not had to use any acute management meds. She had an allergic reaction to ajovy. Patient's neck has worsened, acute cervical strain on chronic myofascial cervical pain. She did great with dry needling will refer again and she is now doing pilates and doin well.   She had eye lower lid surgery Dr. Freddi Starr, saw .Marland Kitchen..doing well.      Consent Form Botulism Toxin Injection For Chronic Migraine    Reviewed orally with patient, additionally signature is on file:  Botulism toxin has been approved by the Federal drug administration for treatment of chronic migraine. Botulism toxin does not  cure chronic migraine and it may not be effective in some patients.  The administration of botulism toxin is accomplished by injecting a small amount of toxin into the muscles of the neck and head. Dosage must be titrated for each individual. Any benefits resulting from botulism toxin tend to wear off after 3 months with a repeat injection required if benefit is to be maintained. Injections are usually done every 3-4 months with maximum effect peak achieved by about 2 or 3 weeks. Botulism toxin is expensive and you should be sure of what costs you will incur resulting from the injection.  The side effects of botulism toxin use for chronic migraine may include:   -Transient, and usually mild, facial weakness with facial injections  -Transient, and usually mild, head or neck weakness with head/neck injections  -Reduction or loss of forehead facial animation due to forehead muscle weakness  -Eyelid drooping  -Dry eye  -Pain at the site of injection or bruising at the site of injection  -Double vision  -Potential unknown long term risks  Contraindications: You should not have Botox if you are pregnant, nursing, allergic to albumin, have an infection, skin condition, or muscle weakness at the site of the injection, or have myasthenia gravis, Lambert-Eaton syndrome, or ALS.  It is also possible that as with any injection, there may be an allergic reaction or no effect from the medication. Reduced effectiveness after repeated injections is sometimes seen and rarely infection at the injection site may occur. All care will be taken to prevent these side effects. If therapy is  given over a long time, atrophy and wasting in the muscle injected may occur. Occasionally the patient's become refractory to treatment because they develop antibodies to the toxin. In this event, therapy needs to be modified.  I have read the above information and consent to the administration of botulism toxin.    BOTOX  PROCEDURE NOTE FOR MIGRAINE HEADACHE    Contraindications and precautions discussed with patient(above). Aseptic procedure was observed and patient tolerated procedure. Procedure performed by Dr. Georgia Dom  The condition has existed for more than 6 months, and pt does not have a diagnosis of ALS, Myasthenia Gravis or Lambert-Eaton Syndrome.  Risks and benefits of injections discussed and pt agrees to proceed with the procedure.  Written consent obtained  These injections are medically necessary. Pt  receives good benefits from these injections. These injections do not cause sedations or hallucinations which the oral therapies may cause.  Description of procedure:  The patient was placed in a sitting position. The standard protocol was used for Botox as follows, with 5 units of Botox injected at each site:   -Procerus muscle, midline injection  -Corrugator muscle, bilateral injection  -Frontalis muscle, bilateral injection, with 2 sites each side, medial injection was performed in the upper one third of the frontalis muscle, in the region vertical from the medial inferior edge of the superior orbital rim. The lateral injection was again in the upper one third of the forehead vertically above the lateral limbus of the cornea, 1.5 cm lateral to the medial injection site.  - Levator Scapulae: 5 units bilaterally  -Temporalis muscle injection, 5 sites, bilaterally. The first injection was 3 cm above the tragus of the ear, second injection site was 1.5 cm to 3 cm up from the first injection site in line with the tragus of the ear. The third injection site was 1.5-3 cm forward between the first 2 injection sites. The fourth injection site was 1.5 cm posterior to the second injection site. 5th site laterally in the temporalis  muscleat the level of the outer canthus.  - Patient feels the migraines are centered around the eyes +5 units bilaterally at the outer canthus in the orbicularis  occuli  -Occipitalis muscle injection, 3 sites, bilaterally. The first injection was done one half way between the occipital protuberance and the tip of the mastoid process behind the ear. The second injection site was done lateral and superior to the first, 1 fingerbreadth from the first injection. The third injection site was 1 fingerbreadth superiorly and medially from the first injection site.  -Cervical paraspinal muscle injection, 2 sites, bilateral knee first injection site was 1 cm from the midline of the cervical spine, 3 cm inferior to the lower border of the occipital protuberance. The second injection site was 1.5 cm superiorly and laterally to the first injection site.  -Trapezius muscle injection was performed at 3 sites, bilaterally. The first injection site was in the upper trapezius muscle halfway between the inflection point of the neck, and the acromion. The second injection site was one half way between the acromion and the first injection site. The third injection was done between the first injection site and the inflection point of the neck.   Will return for repeat injection in 3 months.   A 200 unit sof Botox was used, any Botox not injected was wasted. The patient tolerated the procedure well, there were no complications of the above procedure.

## 2021-04-29 NOTE — Progress Notes (Signed)
Botox- 100 units x 2 vials Lot: [id number] Expiration: 01/2023 NDC: 0023/-1145-01  Bacteriostatic 0.9% Sodium Chloride- 31m total Lot: [id number]: 05/26/2022 NDC: [address]-76 Dx: [id number]/P

## 2021-07-07 ENCOUNTER — Telehealth: Payer: Self-pay | Admitting: Neurology

## 2021-07-07 NOTE — Telephone Encounter (Signed)
Patient's next Botox appointment is 8/9. Today I received (2) 100 unit vials of Botox from [place]. Patient has PA on file with [employer] that is good until 03/21/22.

## 2021-08-03 ENCOUNTER — Encounter: Payer: Self-pay | Admitting: Neurology

## 2021-08-03 ENCOUNTER — Ambulatory Visit: Payer: BC Managed Care – PPO | Admitting: Neurology

## 2021-08-03 DIAGNOSIS — G43711 Chronic migraine without aura, intractable, with status migrainosus: Secondary | ICD-10-CM

## 2021-08-03 NOTE — Progress Notes (Signed)
08/03/2021; Stable. Playsmals, she sees Shirlean Mylar for those, if she needs more botox from her cervical spine pain I told her to email me and I can mix some botox samples  Interval history5/04/2021: No changes  Interval history 10/20/2020: She rarely has a headache, can;t remember when she had one last, +orb oculi, no masseters, no temples, +paraspinals around T2-T4 for pain there +5 bilat extra. NO CGRP. Was on Ajovy* but insurance would not pay for both, doing great on botox alone however. 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 Husband is radiology.   Interval history 07/07/2020: >95% improvement in frequency and severity of migraines.   Interval history 04/02/2020: >95% improvement on botox, frequency migraines. Wil try the new medications, acute. Roselyn Meier works best.. Resend to dry needling, she loved Donnetta Simpers will reuest for cervical myofascial pain syndrome. She has 2 kids 24 and 28. 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 Hanley Seamen her a few bio freeze to try, she said the biofreeze worked.  Interval history 05/29/2019: sill doing well on botox. She had 100% relief. Has had a few migraines as we are late on botox theray due to covid19.   Interval history 02/19/2019: Not one headache since last botox, remarkable improvement on botox. +temples and oo. No masseters. +traps and +levators. She loved the dry needling, [place] [address]. She had rebound from imitrex, ibuprofen and other meds. She is on Emgality and botox. She has not had to use any acute management meds. She had an allergic reaction to ajovy. Patient's neck has worsened, acute cervical strain on chronic myofascial cervical pain. She did great with dry needling will refer again and she is now doing pilates and doin well.   She had eye lower lid surgery Dr. Freddi Starr, saw .Marland Kitchen..doing well.      Consent Form Botulism Toxin Injection For Chronic Migraine    Reviewed orally with patient,  additionally signature is on file:  Botulism toxin has been approved by the Federal drug administration for treatment of chronic migraine. Botulism toxin does not cure chronic migraine and it may not be effective in some patients.  The administration of botulism toxin is accomplished by injecting a small amount of toxin into the muscles of the neck and head. Dosage must be titrated for each individual. Any benefits resulting from botulism toxin tend to wear off after 3 months with a repeat injection required if benefit is to be maintained. Injections are usually done every 3-4 months with maximum effect peak achieved by about 2 or 3 weeks. Botulism toxin is expensive and you should be sure of what costs you will incur resulting from the injection.  The side effects of botulism toxin use for chronic migraine may include:   -Transient, and usually mild, facial weakness with facial injections  -Transient, and usually mild, head or neck weakness with head/neck injections  -Reduction or loss of forehead facial animation due to forehead muscle weakness  -Eyelid drooping  -Dry eye  -Pain at the site of injection or bruising at the site of injection  -Double vision  -Potential unknown long term risks  Contraindications: You should not have Botox if you are pregnant, nursing, allergic to albumin, have an infection, skin condition, or muscle weakness at the site of the injection, or have myasthenia gravis, Lambert-Eaton syndrome, or ALS.  It is also possible that as with any injection, there may be an allergic reaction or no effect from the  medication. Reduced effectiveness after repeated injections is sometimes seen and rarely infection at the injection site may occur. All care will be taken to prevent these side effects. If therapy is given over a long time, atrophy and wasting in the muscle injected may occur. Occasionally the patient's become refractory to treatment because they develop antibodies to  the toxin. In this event, therapy needs to be modified.  I have read the above information and consent to the administration of botulism toxin.    BOTOX PROCEDURE NOTE FOR MIGRAINE HEADACHE    Contraindications and precautions discussed with patient(above). Aseptic procedure was observed and patient tolerated procedure. Procedure performed by Dr. Georgia Dom  The condition has existed for more than 6 months, and pt does not have a diagnosis of ALS, Myasthenia Gravis or Lambert-Eaton Syndrome.  Risks and benefits of injections discussed and pt agrees to proceed with the procedure.  Written consent obtained  These injections are medically necessary. Pt  receives good benefits from these injections. These injections do not cause sedations or hallucinations which the oral therapies may cause.  Description of procedure:  The patient was placed in a sitting position. The standard protocol was used for Botox as follows, with 5 units of Botox injected at each site:   -Procerus muscle, midline injection  -Corrugator muscle, bilateral injection  -Frontalis muscle, bilateral injection, with 2 sites each side, medial injection was performed in the upper one third of the frontalis muscle, in the region vertical from the medial inferior edge of the superior orbital rim. The lateral injection was again in the upper one third of the forehead vertically above the lateral limbus of the cornea, 1.5 cm lateral to the medial injection site.  - Levator Scapulae: 5 units bilaterally  -Temporalis muscle injection, 5 sites, bilaterally. The first injection was 3 cm above the tragus of the ear, second injection site was 1.5 cm to 3 cm up from the first injection site in line with the tragus of the ear. The third injection site was 1.5-3 cm forward between the first 2 injection sites. The fourth injection site was 1.5 cm posterior to the second injection site. 5th site laterally in the temporalis  muscleat the  level of the outer canthus.  - Patient feels the migraines are centered around the eyes +5 units bilaterally at the outer canthus in the orbicularis occuli  -Occipitalis muscle injection, 3 sites, bilaterally. The first injection was done one half way between the occipital protuberance and the tip of the mastoid process behind the ear. The second injection site was done lateral and superior to the first, 1 fingerbreadth from the first injection. The third injection site was 1 fingerbreadth superiorly and medially from the first injection site.  -Cervical paraspinal muscle injection, 2 sites, bilateral knee first injection site was 1 cm from the midline of the cervical spine, 3 cm inferior to the lower border of the occipital protuberance. The second injection site was 1.5 cm superiorly and laterally to the first injection site.  -Trapezius muscle injection was performed at 3 sites, bilaterally. The first injection site was in the upper trapezius muscle halfway between the inflection point of the neck, and the acromion. The second injection site was one half way between the acromion and the first injection site. The third injection was done between the first injection site and the inflection point of the neck.   Will return for repeat injection in 3 months.   A 200 unit sof Botox was used,  any Botox not injected was wasted. The patient tolerated the procedure well, there were no complications of the above procedure.

## 2021-08-03 NOTE — Progress Notes (Signed)
Botox- 100 units x 2 vials Lot: [id number] Expiration: 04/2023 NDC: 9444-6190-12  Bacteriostatic 0.9% Sodium Chloride- 40m total Lot: [id number]: 12/26/2022 NDC: [address]-70 Dx: [id number] [id number] S/P

## 2021-08-24 ENCOUNTER — Other Ambulatory Visit: Payer: Self-pay

## 2021-08-24 ENCOUNTER — Ambulatory Visit (HOSPITAL_BASED_OUTPATIENT_CLINIC_OR_DEPARTMENT_OTHER): Payer: BC Managed Care – PPO | Admitting: Cardiovascular Disease

## 2021-08-24 ENCOUNTER — Encounter (HOSPITAL_BASED_OUTPATIENT_CLINIC_OR_DEPARTMENT_OTHER): Payer: Self-pay | Admitting: Cardiovascular Disease

## 2021-08-24 VITALS — BP 112/80 | HR 70 | Ht 62.5 in | Wt 133.8 lb

## 2021-08-24 DIAGNOSIS — Z5181 Encounter for therapeutic drug level monitoring: Secondary | ICD-10-CM | POA: Diagnosis not present

## 2021-08-24 DIAGNOSIS — I10 Essential (primary) hypertension: Secondary | ICD-10-CM | POA: Diagnosis not present

## 2021-08-24 DIAGNOSIS — Z7189 Other specified counseling: Secondary | ICD-10-CM | POA: Diagnosis not present

## 2021-08-24 DIAGNOSIS — M459 Ankylosing spondylitis of unspecified sites in spine: Secondary | ICD-10-CM

## 2021-08-24 DIAGNOSIS — K50919 Crohn's disease, unspecified, with unspecified complications: Secondary | ICD-10-CM

## 2021-08-24 DIAGNOSIS — K5090 Crohn's disease, unspecified, without complications: Secondary | ICD-10-CM

## 2021-08-24 HISTORY — DX: Crohn's disease, unspecified, without complications: K50.90

## 2021-08-24 HISTORY — DX: Ankylosing spondylitis of unspecified sites in spine: M45.9

## 2021-08-24 HISTORY — DX: Essential (primary) hypertension: I10

## 2021-08-24 MED ORDER — HYDROCHLOROTHIAZIDE 12.5 MG PO TABS
12.5000 mg | ORAL_TABLET | Freq: Every day | ORAL | 3 refills | Status: DC
Start: 2021-08-24 — End: 2022-05-11

## 2021-08-24 NOTE — Assessment & Plan Note (Signed)
Blood pressures well have been controlled in clinic today.  She wonders if she could stop taking her medicine.  On many visits her diastolic blood pressure has been over 80.  I suspect if she stops her blood pressure medicine it will be much higher.  She thinks her blood pressure is better controlled at home.  However, she does not check her blood pressure often.  She notes fatigue with exercise and not being able to do as much as she used to.  She has transiently had heart rates recorded as low as the 40s.  She has no lightheadedness or dizziness.  We will stop her bisoprolol/HCTZ and increase the HCTZ to 12.5 mg daily.  She will come back for BMP and a TSH in a week.  She will track her blood pressures twice a day.  She will also work on following the [place] and limit sodium to 1500 to 2000 mg a day.  She will continue with her regular exercise.  She is interested in enrolling in the PREP program through the [employer]

## 2021-08-24 NOTE — Assessment & Plan Note (Signed)
Patient's ASCVD 10-year risk is 2%.  She does have a family history of CAD.  She is interested in getting a coronary calcium score.  For now, we will not consider any lipid-lowering medication and she will continue working on diet and exercise.

## 2021-08-24 NOTE — Patient Instructions (Addendum)
Medication Instructions:  STOP BISOPROLOL HCT   START HYDROCHLOROTHIAZIDE 12.5 MG DAILY    Labwork: BMET/TSH IN 1 WEEK    Testing/Procedures: CALCIUM SCORE - THIS WILL COST YOU $99 OUT OF [facility] [place] [address]    Follow-Up: 09/28/2021 10:00 AM AT [place] [address]    You will receive a phone call from the PREP exercise and nutrition program to schedule an initial assessment   Special Instructions:   MONITOR YOUR BLOOD PRESSURE TWICE A DAY, LOG IN THE BOOK PROVIDED. BRING THE BOOK AND YOUR BLOOD PRESSURE MACHINE TO YOUR FOLLOW UP IN 1 MONTH   DASH Eating Plan DASH stands for "Dietary Approaches to Stop Hypertension." The DASH eating plan is a healthy eating plan that has been shown to reduce high blood pressure (hypertension). It may also reduce your risk for type 2 diabetes, heart disease, and stroke. The DASH eating plan may also help with weight loss. What are tips for following this plan?  General guidelines Avoid eating more than 2,300 mg (milligrams) of salt (sodium) a day. If you have hypertension, you may need to reduce your sodium intake to 1,500 mg a day. Limit alcohol intake to no more than 1 drink a day for nonpregnant women and 2 drinks a day for men. One drink equals 12 oz of beer, 5 oz of wine, or 1 oz of hard liquor. Work with your health care provider to maintain a healthy body weight or to lose weight. Ask what an ideal weight is for you. Get at least 30 minutes of exercise that causes your heart to beat faster (aerobic exercise) most days of the week. Activities may include walking, swimming, or biking. Work with your health care provider or diet and nutrition specialist (dietitian) to adjust your eating plan to your individual calorie needs. Reading food labels  Check food labels for the amount of sodium per serving. Choose foods with less than 5 percent of the Daily Value of sodium. Generally, foods with less than 300 mg of  sodium per serving fit into this eating plan. To find whole grains, look for the word "whole" as the first word in the ingredient list. Shopping Buy products labeled as "low-sodium" or "no salt added." Buy fresh foods. Avoid canned foods and premade or frozen meals. Cooking Avoid adding salt when cooking. Use salt-free seasonings or herbs instead of table salt or sea salt. Check with your health care provider or pharmacist before using salt substitutes. Do not fry foods. Cook foods using healthy methods such as baking, boiling, grilling, and broiling instead. Cook with heart-healthy oils, such as olive, canola, soybean, or sunflower oil. Meal planning Eat a balanced diet that includes: 5 or more servings of fruits and vegetables each day. At each meal, try to fill half of your plate with fruits and vegetables. Up to 6-8 servings of whole grains each day. Less than 6 oz of lean meat, poultry, or fish each day. A 3-oz serving of meat is about the same size as a deck of cards. One egg equals 1 oz. 2 servings of low-fat dairy each day. A serving of nuts, seeds, or beans 5 times each week. Heart-healthy fats. Healthy fats called Omega-3 fatty acids are found in foods such as flaxseeds and coldwater fish, like sardines, salmon, and mackerel. Limit how much you eat of the following: Canned or prepackaged foods. Food that is high in trans fat, such as fried foods. Food that is  high in saturated fat, such as fatty meat. Sweets, desserts, sugary drinks, and other foods with added sugar. Full-fat dairy products. Do not salt foods before eating. Try to eat at least 2 vegetarian meals each week. Eat more home-cooked food and less restaurant, buffet, and fast food. When eating at a restaurant, ask that your food be prepared with less salt or no salt, if possible. What foods are recommended? The items listed may not be a complete list. Talk with your dietitian about what dietary choices are best for  you. Grains Whole-grain or whole-wheat bread. Whole-grain or whole-wheat pasta. Brown rice. [place]. Bulgur. Whole-grain and low-sodium cereals. Pita bread. Low-fat, low-sodium crackers. Whole-wheat flour tortillas. Vegetables Fresh or frozen vegetables (raw, steamed, roasted, or grilled). Low-sodium or reduced-sodium tomato and vegetable juice. Low-sodium or reduced-sodium tomato sauce and tomato paste. Low-sodium or reduced-sodium canned vegetables. Fruits All fresh, dried, or frozen fruit. Canned fruit in natural juice (without added sugar). Meat and other protein foods Skinless chicken or [place]. Ground chicken or [place]. Pork with fat trimmed off. Fish and seafood. Egg whites. Dried beans, peas, or lentils. Unsalted nuts, nut butters, and seeds. Unsalted canned beans. Lean cuts of beef with fat trimmed off. Low-sodium, lean deli meat. Dairy Low-fat (1%) or fat-free (skim) milk. Fat-free, low-fat, or reduced-fat cheeses. Nonfat, low-sodium ricotta or cottage cheese. Low-fat or nonfat yogurt. Low-fat, low-sodium cheese. Fats and oils Soft margarine without trans fats. Vegetable oil. Low-fat, reduced-fat, or light mayonnaise and salad dressings (reduced-sodium). Canola, safflower, olive, soybean, and sunflower oils. Avocado. Seasoning and other foods Herbs. Spices. Seasoning mixes without salt. Unsalted popcorn and pretzels. Fat-free sweets. What foods are not recommended? The items listed may not be a complete list. Talk with your dietitian about what dietary choices are best for you. Grains Baked goods made with fat, such as croissants, muffins, or some breads. Dry pasta or rice meal packs. Vegetables Creamed or fried vegetables. Vegetables in a cheese sauce. Regular canned vegetables (not low-sodium or reduced-sodium). Regular canned tomato sauce and paste (not low-sodium or reduced-sodium). Regular tomato and vegetable juice (not low-sodium or reduced-sodium). Angie Fava.  Olives. Fruits Canned fruit in a light or heavy syrup. Fried fruit. Fruit in cream or butter sauce. Meat and other protein foods Fatty cuts of meat. Ribs. Fried meat. Berniece Salines. Sausage. Bologna and other processed lunch meats. Salami. Fatback. Hotdogs. Bratwurst. Salted nuts and seeds. Canned beans with added salt. Canned or smoked fish. Whole eggs or egg yolks. Chicken or [place] with skin. Dairy Whole or 2% milk, cream, and half-and-half. Whole or full-fat cream cheese. Whole-fat or sweetened yogurt. Full-fat cheese. Nondairy creamers. Whipped toppings. Processed cheese and cheese spreads. Fats and oils Butter. Stick margarine. Lard. Shortening. Ghee. Bacon fat. Tropical oils, such as coconut, palm kernel, or palm oil. Seasoning and other foods Salted popcorn and pretzels. Onion salt, garlic salt, seasoned salt, table salt, and sea salt. Worcestershire sauce. Tartar sauce. Barbecue sauce. Teriyaki sauce. Soy sauce, including reduced-sodium. Steak sauce. Canned and packaged gravies. Fish sauce. Oyster sauce. Cocktail sauce. Horseradish that you find on the shelf. Ketchup. Mustard. Meat flavorings and tenderizers. Bouillon cubes. Hot sauce and Tabasco sauce. Premade or packaged marinades. Premade or packaged taco seasonings. Relishes. Regular salad dressings. Where to find more information: National Heart, Lung, and [place]: https://wilson-eaton.com/ American Heart Association: www.heart.org Summary The DASH eating plan is a healthy eating plan that has been shown to reduce high blood pressure (hypertension). It may also reduce your risk for type 2 diabetes,  heart disease, and stroke. With the DASH eating plan, you should limit salt (sodium) intake to 2,300 mg a day. If you have hypertension, you may need to reduce your sodium intake to 1,500 mg a day. When on the DASH eating plan, aim to eat more fresh fruits and vegetables, whole grains, lean proteins, low-fat dairy, and heart-healthy fats. Work with  your health care provider or diet and nutrition specialist (dietitian) to adjust your eating plan to your individual calorie needs. This information is not intended to replace advice given to you by your health care provider. Make sure you discuss any questions you have with your health care provider. Document Released: 12/01/2011 Document Revised: 11/24/2017 Document Reviewed: 12/05/2016 Elsevier Patient Education  2020 [employer].

## 2021-08-24 NOTE — Progress Notes (Signed)
Advanced Hypertension Clinic Initial Assessment:    Date:  08/24/2021   ID:  Jennifer Sims, DOB Apr 18, 1966, MRN [id number]  PCP:  Idelle Crouch, MD  Cardiologist:  None  Nephrologist:  Referring MD: Jennifer Salina, MD   CC: Hypertension  History of Present Illness:    Jennifer Sims is a 55 y.o. female with a hx of hypertension, Crohn's disease, and ankylosing spondylitis, here to establish care in the Advanced Hypertension Clinic. She was first dx with hypertension 04/2021. She was started on bisoprolol and HCTZ. She was Dr. Garwin Brothers 05/12/2021 and her blood pressure was 109/79. She was referred to advanced hypertension clinic to discuss whether she could stop the medicine and to consider a calcium score.  Today, she reports her blood pressure was first elevated during her pregnancy in 2006 (no preeclampsia). However, she was not on medication until 1-2 years ago. Previous clinic visits usually found her blood pressure to be 140/90 with automatic machines and 120/80 with manual machines. Lately she is not checking her blood pressure at home. For her diet, she usually orders out for lunch, and otherwise cooks meals at home. She is not monitoring her salt intake, but does drink well over 8 cups of water daily. Caffeine is usually 1 cup of green tea in the morning, and maybe decaffeinated tea or coffee. She does not drink much alcohol, may have virgin pina coladas. Supplements include vitamin D3, vitamin B12, and magnesium oxide. She takes Humira for ankylosing spondylitis, which manages her pain well. She avoids tylenol and ibuprofen due to side effects of rebound headaches, which she attributes to taking this medication too often in 2016 for migraines. Now her migraines are controlled with Botox. 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 Her exercise routine includes running for 35 minutes (3 miles) for 5-7 days a week. She also does strength exercises 2-3  times a week, which have been "a bit of a struggle" recently. For example, she is not able to complete the amount of push-ups she wants to complete due to fatigue. However, she denies any chest pains. Running does not cause her to struggle because she has been running for a long time. Her husband has told her that she snores. In the morning she does not feel well-rested, and usually sleeps 6.5 hours. She falls asleep easily throughout the day, including while driving. Four years ago she had a sleep study, which was negative for sleep apnea and narcolepsy. A few years ago she was having an issue with LE edema, but this has improved. She denies any palpitations, or shortness of breath. No lightheadedness, syncope, orthopnea, or PND. Her 10-year risk score is 2%.   Past Medical History:  Diagnosis Date   Ankylosing spondylitis ([place]) 08/24/2021   Crohn disease ([place]) 08/24/2021   Crohn's disease ([place])    Essential hypertension 08/24/2021   Migraine    Osteopenia     Past Surgical History:  Procedure Laterality Date   CHOLECYSTECTOMY  2009    Current Medications: Current Meds  Medication Sig   Adalimumab (HUMIRA ) Inject into the skin. 1 shot every 2 weeks.   BOTOX 100 units SOLR injection PROVIDER TO INJECT 155 UNITS INTO THE HEAD AND NECK MUSCLES AS DIRECTED. DISCARD REMAINDER.   Cholecalciferol (VITAMIN D3) 1000 units CAPS Take 1 capsule by mouth daily.   finasteride (PROSCAR) 5 MG tablet Take 5 mg by mouth daily.   hydrochlorothiazide (HYDRODIURIL) 12.5 MG tablet Take 1  tablet (12.5 mg total) by mouth daily.   hydroquinone 4 % cream Apply topically daily.   Multiple Vitamins-Minerals (MULTIVITAMIN ADULTS PO) Take 1 tablet by mouth daily.   Tretinoin-Cleanser-Moisturizer 0.025 % CREAM KIT Apply topically daily.   vitamin B-12 (CYANOCOBALAMIN) 1000 MCG tablet Take 1,000 mcg by mouth daily.   [DISCONTINUED] bisoprolol-hydrochlorothiazide (ZIAC) 5-6.25 MG tablet Take 1 tablet by mouth daily.      Allergies:   Elemental sulfur   Social History   Socioeconomic History   Marital status: Married    Spouse name: Medical laboratory scientific officer   Number of children: 2   Years of education: PHD JD   Highest education level: Not on file  Occupational History   Occupation: University   Tobacco Use   Smoking status: Never   Smokeless tobacco: Never  Substance and Sexual Activity   Alcohol use: Yes    Comment: 1 drink per month   Drug use: No   Sexual activity: Not on file  Other Topics Concern   Not on file  Social History Narrative   Lives with husband and children   Caffeine use: 1 cup tea per day   Social Determinants of Health   Financial Resource Strain: Low Risk    Difficulty of Paying Living Expenses: Not hard at all  Food Insecurity: No Food Insecurity   Worried About Charity fundraiser in the Last Year: Never true   [place] in the Last Year: Never true  Transportation Needs: No Transportation Needs   Lack of Transportation (Medical): No   Lack of Transportation (Non-Medical): No  Physical Activity: Sufficiently Active   Days of Exercise per Week: 6 days   Minutes of Exercise per Session: 40 min  Stress: Not on file  Social Connections: Not on file     Family History: The patient's family history includes Asthma in her father; Diabetes Mellitus II in her mother; Heart failure in her father and maternal grandmother; Hypertension in her brother and mother; Osteoporosis in her maternal grandmother.  ROS:   Please see the history of present illness.    (+) Fatigue (+) Migraines (+) Snores (+) Thinning hair All other systems reviewed and are negative.  EKGs/Labs/Other Studies Reviewed:    No prior CV studies available.  EKG:   08/24/2021: Sinus rhythm. Rate 70 bpm.  Recent Labs: No results found for requested labs within last 8760 hours.   Recent Lipid Panel No results found for: CHOL, TRIG, HDL, CHOLHDL, VLDL, LDLCALC, LDLDIRECT  Physical Exam:   VS:  BP  112/80 (BP Location: Right Arm, Patient Position: Sitting)   Pulse 70   Ht 5' 2.5" (1.588 m)   Wt 133 lb 12.8 oz (60.7 kg)   BMI 24.08 kg/m  , BMI Body mass index is 24.08 kg/m. GENERAL:  Well appearing HEENT: Pupils equal round and reactive, fundi not visualized, oral mucosa unremarkable NECK:  No jugular venous distention, waveform within normal limits, carotid upstroke brisk and symmetric, no bruits LUNGS:  Clear to auscultation bilaterally HEART:  RRR.  PMI not displaced or sustained,S1 and S2 within normal limits, no S3, no S4, no clicks, no rubs, no murmurs ABD:  Flat, positive bowel sounds normal in frequency in pitch, no bruits, no rebound, no guarding, no midline pulsatile mass, no hepatomegaly, no splenomegaly EXT:  2 plus pulses throughout, no edema, no cyanosis no clubbing SKIN:  No rashes no nodules NEURO:  Cranial nerves II through XII grossly intact, motor grossly intact throughout PSYCH:  Cognitively intact, oriented to person place and time   ASSESSMENT/PLAN:    Essential hypertension Blood pressures well have been controlled in clinic today.  She wonders if she could stop taking her medicine.  On many visits her diastolic blood pressure has been over 80.  I suspect if she stops her blood pressure medicine it will be much higher.  She thinks her blood pressure is better controlled at home.  However, she does not check her blood pressure often.  She notes fatigue with exercise and not being able to do as much as she used to.  She has transiently had heart rates recorded as low as the 40s.  She has no lightheadedness or dizziness.  We will stop her bisoprolol/HCTZ and increase the HCTZ to 12.5 mg daily.  She will come back for BMP and a TSH in a week.  She will track her blood pressures twice a day.  She will also work on following the [place] and limit sodium to 1500 to 2000 mg a day.  She will continue with her regular exercise.  She is interested in enrolling in the PREP  program through the [facility].  Counseling on health promotion and disease prevention Patient's ASCVD 10-year risk is 2%.  She does have a family history of CAD.  She is interested in getting a coronary calcium score.  For now, we will not consider any lipid-lowering medication and she will continue working on diet and exercise.   Screening for Secondary Hypertension: 45 Causes 08/24/2021  Drugs/Herbals Screened     - Comments will work on sodium intake  Renovascular HTN N/A    Relevant Labs/Studies: Basic Labs 08/06/2007 08/04/2007  Sodium 135 135  Potassium 3.8 3.7  Creatinine 0.71 0.67                    Disposition:    FU with Donna Silverman C. Oval Linsey, MD, [facility] in 1 month.    Time spent: 45 minutes-Greater than 50% of this time was spent in counseling, explanation of diagnosis, planning of further management, and coordination of care.  Medication Adjustments/Labs and Tests Ordered: Current medicines are reviewed at length with the patient today.  Concerns regarding medicines are outlined above.  Orders Placed This Encounter  Procedures   CT CARDIAC SCORING (SELF PAY ONLY)   TSH   Basic metabolic panel   Amb Referral To Provider Referral Exercise Program (P.R.E.P)   EKG 12-Lead    Meds ordered this encounter  Medications   hydrochlorothiazide (HYDRODIURIL) 12.5 MG tablet    Sig: Take 1 tablet (12.5 mg total) by mouth daily.    Dispense:  90 tablet    Refill:  3    D/C BISOPROLOL-HCTZ    I,Mathew Stumpf,acting as a scribe for Skeet Latch, MD.,have documented all relevant documentation on the behalf of Skeet Latch, MD,as directed by  Skeet Latch, MD while in the presence of Skeet Latch, MD.  I, [place] Oval Linsey, MD have reviewed all documentation for this visit.  The documentation of the exam, diagnosis, procedures, and orders on 08/24/2021 are all accurate and complete.   Signed, Skeet Latch, MD  08/24/2021 1:21 PM    [facility]

## 2021-08-27 ENCOUNTER — Telehealth: Payer: Self-pay

## 2021-08-27 NOTE — Telephone Encounter (Signed)
Called to discuss PREP program referral, left voicemail

## 2021-09-14 ENCOUNTER — Ambulatory Visit (INDEPENDENT_AMBULATORY_CARE_PROVIDER_SITE_OTHER)
Admission: RE | Admit: 2021-09-14 | Discharge: 2021-09-14 | Disposition: A | Discharge status: Home / Self Care | Payer: Self-pay | Source: Ambulatory Visit | Attending: Cardiovascular Disease | Admitting: Cardiovascular Disease

## 2021-09-14 ENCOUNTER — Other Ambulatory Visit: Payer: Self-pay

## 2021-09-14 DIAGNOSIS — I10 Essential (primary) hypertension: Secondary | ICD-10-CM

## 2021-09-15 LAB — BASIC METABOLIC PANEL
BUN/Creatinine Ratio: 22 (ref 9–23)
BUN: 14 mg/dL (ref 6–24)
CO2: 26 mmol/L (ref 20–29)
Calcium: 9.8 mg/dL (ref 8.7–10.2)
Chloride: 100 mmol/L (ref 96–106)
Creatinine, Ser: 0.63 mg/dL (ref 0.57–1.00)
Glucose: 88 mg/dL (ref 65–99)
Potassium: 4.1 mmol/L (ref 3.5–5.2)
Sodium: 140 mmol/L (ref 134–144)
eGFR: 105 mL/min/{1.73_m2} (ref >59)

## 2021-09-15 LAB — TSH: TSH: 0.942 u[IU]/mL (ref 0.450–4.500)

## 2021-09-21 ENCOUNTER — Telehealth: Payer: Self-pay | Admitting: Neurology

## 2021-09-21 NOTE — Telephone Encounter (Signed)
Patient's next Botox appointment is 11/14. Today I received (2) 100 unit vials of Botox from [facility]. Patient has PA on file with [employer] that is good until 03/21/22.

## 2021-09-28 ENCOUNTER — Ambulatory Visit: Payer: BC Managed Care – PPO | Admitting: Pharmacist

## 2021-09-28 ENCOUNTER — Encounter: Payer: Self-pay | Admitting: Pharmacist

## 2021-09-28 ENCOUNTER — Ambulatory Visit: Payer: BC Managed Care – PPO

## 2021-09-28 ENCOUNTER — Other Ambulatory Visit: Payer: Self-pay

## 2021-09-28 VITALS — BP 120/88 | HR 97 | Resp 17 | Ht 62.0 in | Wt 133.4 lb

## 2021-09-28 DIAGNOSIS — I10 Essential (primary) hypertension: Secondary | ICD-10-CM | POA: Diagnosis not present

## 2021-09-28 DIAGNOSIS — D649 Anemia, unspecified: Secondary | ICD-10-CM | POA: Insufficient documentation

## 2021-09-28 NOTE — Progress Notes (Signed)
Patient ID: Jennifer Sims                 DOB: 01/21/1966                      MRN: [id number]     HPI: Sallye Lunz Sims is a 55 y.o. female referred by Dr. Oval Linsey to  ADV HTN clinic. PMH is significant for HTN, Crohns Disease, and ankylosing spondylitis.  Previously managed on bisoprolol/HCTZ however BP was controllled at last visit with Dr Oval Linsey and patient was switched to HCTZ alone.  BMP normal.  Patient presents today in good spirits.  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  Has begun checking her BP at home:  09/17/21: 115/83 9/22: 113/77, 113/81 9/21: 115/82, 122/84 9/20: 119/80, 112/80 9/19: 131/78, 107/69 9/18: 129/84 9/17: 113/74 9/16: 116/74, 105/77  She reports her diet is "ok."  Eats vegetables often but during the week she often eats on the run.  Does not watch her salt intake.  At last visit, bisoprolol was discontinued and she is only on HCTZ.  Reports dry mouth.  Diastolic BP has been slightly above goal.  Is physically active.  Runs on a treadmill for 30 minutes at least 5 days a week. Also does 10-15 minutes of stretching.  Current HTN meds: HCTZ 12.91m daily Previously tried: bisoprolol/HCTZ  BP goal: <130/80   Wt Readings from Last 3 Encounters:  08/24/21 133 lb 12.8 oz (60.7 kg)  10/01/20 141 lb (64 kg)  03/13/18 134 lb (60.8 kg)   BP Readings from Last 3 Encounters:  08/24/21 112/80  10/01/20 129/88  03/30/20 121/84   Pulse Readings from Last 3 Encounters:  08/24/21 70  10/01/20 (!) 44  03/30/20 61    Renal function: CrCl cannot be calculated (Unknown ideal weight.).  Past Medical History:  Diagnosis Date   [place]osing [place]ond[place]itis ([place] 08/24/2021   [place] disease [place] 08/24/2021   [place]'s disease ([place]    Essential hypertension 08/24/2021   Migraine    Osteopenia     Current Outpatient Medications on File Prior to Visit  Medication Sig Dispense Refill   Adalimumab (HUMIRA [place]) Inject into the skin.  1 shot every 2 weeks.     BOTOX 100 units SOLR injection PROVIDER TO INJECT 155 UNITS INTO THE HEAD AND NECK MUSCLES AS DIRECTED. DISCARD REMAINDER. 2 each 1   Cholecalciferol (VITAMIN D3) 1000 units CAPS Take 1 capsule by mouth daily.     finasteride (PROSCAR) 5 MG tablet Take 5 mg by mouth daily.     hydrochlorothiazide (HYDRODIURIL) 12.5 MG tablet Take 1 tablet (12.5 mg total) by mouth daily. 90 tablet 3   hydroquinone 4 % cream Apply topically daily.     magnesium oxide (MAG-OX) 400 MG tablet Take 1 tablet by mouth daily.     Multiple Vitamins-Minerals (MULTIVITAMIN ADULTS PO) Take 1 tablet by mouth daily.     Tretinoin-Cleanser-Moisturizer 0.025 % CREAM KIT Apply topically daily.     vitamin B-12 (CYANOCOBALAMIN) 1000 MCG tablet Take 1,000 mcg by mouth daily.     No current facility-administered medications on file prior to visit.    Allergies  Allergen Reactions   Elemental Sulfur Nausea Only     Assessment/Plan:  1. Hypertension -  Patient BP in room today 120/88 which is slightly above goal of <130/80. Similar to home BP readings.  This could be due to sodium intake.  Recommended she watch  her salt intake and try to keep less than 2000 mg a day.  Recommended she continue to monitor home BP. Systolic remains at goal but to call if she makes diet changes and diastolic readings remain above 80.  No med changes at this time.  Continue HCTZ 12.75m daily  CKarren Cobble PharmD, BCACP, CWhite City CMontclair S[place]my[place]armony[place] [address]Phone: [phone] Fax: [phone]

## 2021-09-28 NOTE — Patient Instructions (Signed)
It was nice meeting you today  We would like you to keep your blood pressure less than 130/80  Try to watch the sodium in your diet and try to keep it less than 2087m a day  Continue your hydrochlorothiazide 12.570monce a day  Continue logging your blood pressure at home and if your diastolic blood pressure (the bottom number) remains over 80, please let usKoreanow  Please call with any questions!  ChKarren CobblePharmD, BCACP, [id number]. Ch64 Foster RoadGrBaxter SpringsNC [address]hone: [phone]: ([phone]/03/2021 10:34 AM

## 2021-11-08 ENCOUNTER — Ambulatory Visit: Payer: BC Managed Care – PPO | Admitting: Neurology

## 2021-11-08 DIAGNOSIS — G43009 Migraine without aura, not intractable, without status migrainosus: Secondary | ICD-10-CM

## 2021-11-08 DIAGNOSIS — G43711 Chronic migraine without aura, intractable, with status migrainosus: Secondary | ICD-10-CM

## 2021-11-08 MED ORDER — NURTEC 75 MG PO TBDP: 75.0000 mg | ORAL_TABLET | Freq: Every day | ORAL | 6 refills | Status: DC | PRN

## 2021-11-08 NOTE — Progress Notes (Signed)
Botox- 100 units x 2 vials Lot: [id number] Expiration: 05/2023 NDC: 4720-7218-28  Bacteriostatic 0.9% Sodium Chloride- 69m total Lot: [id number]: 12/26/2022 NDC: [address]-98 Dx: GX21.587S/P

## 2021-11-08 NOTE — Progress Notes (Signed)
Consent Form Botulism Toxin Injection For Chronic Migraine 11/08/2021: >95% improvement in frequency and severity of migraines. See notes. +a.if she needs more botox from her cervical spine pain I told her to email me and I can mix some botox samples Wil botox she has 4 migraine days a month (4 total headache days a month including migraines). She sees Robin at another practice who does her platysma muscles ask what she had done last time before injecting anywhere. Tried to get Nurtec sent her to [facility], she failed multiple triptans   Reviewed orally with patient, additionally signature is on file:  Botulism toxin has been approved by the Federal drug administration for treatment of chronic migraine. Botulism toxin does not cure chronic migraine and it may not be effective in some patients.  The administration of botulism toxin is accomplished by injecting a small amount of toxin into the muscles of the neck and head. Dosage must be titrated for each individual. Any benefits resulting from botulism toxin tend to wear off after 3 months with a repeat injection required if benefit is to be maintained. Injections are usually done every 3-4 months with maximum effect peak achieved by about 2 or 3 weeks. Botulism toxin is expensive and you should be sure of what costs you will incur resulting from the injection.  The side effects of botulism toxin use for chronic migraine may include:   -Transient, and usually mild, facial weakness with facial injections  -Transient, and usually mild, head or neck weakness with head/neck injections  -Reduction or loss of forehead facial animation due to forehead muscle weakness  -Eyelid drooping  -Dry eye  -Pain at the site of injection or bruising at the site of injection  -Double vision  -Potential unknown long term risks  Contraindications: You should not have Botox if you are pregnant, nursing, allergic to albumin, have an infection, skin condition,  or muscle weakness at the site of the injection, or have myasthenia gravis, Lambert-Eaton syndrome, or ALS.  It is also possible that as with any injection, there may be an allergic reaction or no effect from the medication. Reduced effectiveness after repeated injections is sometimes seen and rarely infection at the injection site may occur. All care will be taken to prevent these side effects. If therapy is given over a long time, atrophy and wasting in the muscle injected may occur. Occasionally the patient's become refractory to treatment because they develop antibodies to the toxin. In this event, therapy needs to be modified.  I have read the above information and consent to the administration of botulism toxin.    BOTOX PROCEDURE NOTE FOR MIGRAINE HEADACHE    Contraindications and precautions discussed with patient(above). Aseptic procedure was observed and patient tolerated procedure. Procedure performed by Dr. [place] Dom  The condition has existed for more than 6 months, and pt does not have a diagnosis of ALS, Myasthenia Gravis or Lambert-Eaton Syndrome.  Risks and benefits of injections discussed and pt agrees to proceed with the procedure.  Written consent obtained  These injections are medically necessary. Pt  receives good benefits from these injections. These injections do not cause sedations or hallucinations which the oral therapies may cause.  Description of procedure:  The patient was placed in a sitting position. The standard protocol was used for Botox as follows, with 5 units of Botox injected at each site:   -Procerus muscle, midline injection  -Corrugator muscle, bilateral injection  -Frontalis muscle, bilateral injection, with  2 sites each side, medial injection was performed in the upper one third of the frontalis muscle, in the region vertical from the medial inferior edge of the superior orbital rim. The lateral injection was again in the upper one third of the  forehead vertically above the lateral limbus of the cornea, 1.5 cm lateral to the medial injection site.  - Levator Scapulae: 5 units bilaterally  -Temporalis muscle injection, 5 sites, bilaterally. The first injection was 3 cm above the tragus of the ear, second injection site was 1.5 cm to 3 cm up from the first injection site in line with the tragus of the ear. The third injection site was 1.5-3 cm forward between the first 2 injection sites. The fourth injection site was 1.5 cm posterior to the second injection site. 5th site laterally in the temporalis  muscleat the level of the outer canthus.  - Patient feels the migraines are centered around the eyes +5 units bilaterally at the outer canthus in the orbicularis occuli  -Occipitalis muscle injection, 3 sites, bilaterally. The first injection was done one half way between the occipital protuberance and the tip of the mastoid process behind the ear. The second injection site was done lateral and superior to the first, 1 fingerbreadth from the first injection. The third injection site was 1 fingerbreadth superiorly and medially from the first injection site.  -Cervical paraspinal muscle injection, 2 sites, bilateral knee first injection site was 1 cm from the midline of the cervical spine, 3 cm inferior to the lower border of the occipital protuberance. The second injection site was 1.5 cm superiorly and laterally to the first injection site.  -Trapezius muscle injection was performed at 3 sites, bilaterally. The first injection site was in the upper trapezius muscle halfway between the inflection point of the neck, and the acromion. The second injection site was one half way between the acromion and the first injection site. The third injection was done between the first injection site and the inflection point of the neck.   Will return for repeat injection in 3 months.   A 155 unit sof Botox was used, 45u Botox not injected was wasted. The  patient tolerated the procedure well, there were no complications of the above procedure.

## 2021-11-09 ENCOUNTER — Ambulatory Visit: Payer: BC Managed Care – PPO | Admitting: Neurology

## 2022-02-01 ENCOUNTER — Ambulatory Visit: Payer: BC Managed Care – PPO | Admitting: Neurology

## 2022-02-01 ENCOUNTER — Telehealth: Payer: Self-pay | Admitting: Neurology

## 2022-02-01 DIAGNOSIS — G43711 Chronic migraine without aura, intractable, with status migrainosus: Secondary | ICD-10-CM

## 2022-02-01 NOTE — Progress Notes (Signed)
Botox- 100 units x 2 vials Lot: [id number] Expiration: 03/2023 NDC: 5732-2025-42  Bacteriostatic 0.9% Sodium Chloride- 62mL total Lot: [id number] Expiration: 01/26/2023 NDC: 6283-1517-61  Dx: Y07.371 S/P

## 2022-02-01 NOTE — Progress Notes (Signed)
Consent Form Botulism Toxin Injection For Chronic Migraine 02/01/2022; stable oding great 11/08/2021: >95% improvement in frequency and severity of migraines.+a  Wil botox she has 4 migraine days a month (4 total headache days a month including migraines). She sees Robin at another practice who does her platysma muscles ask what she had done last time before injecting anywhere. Tried to get Nurtec sent her to [facility], she failed multiple triptans   Reviewed orally with patient, additionally signature is on file:  Botulism toxin has been approved by the Federal drug administration for treatment of chronic migraine. Botulism toxin does not cure chronic migraine and it may not be effective in some patients.  The administration of botulism toxin is accomplished by injecting a small amount of toxin into the muscles of the neck and head. Dosage must be titrated for each individual. Any benefits resulting from botulism toxin tend to wear off after 3 months with a repeat injection required if benefit is to be maintained. Injections are usually done every 3-4 months with maximum effect peak achieved by about 2 or 3 weeks. Botulism toxin is expensive and you should be sure of what costs you will incur resulting from the injection.  The side effects of botulism toxin use for chronic migraine may include:   -Transient, and usually mild, facial weakness with facial injections  -Transient, and usually mild, head or neck weakness with head/neck injections  -Reduction or loss of forehead facial animation due to forehead muscle weakness  -Eyelid drooping  -Dry eye  -Pain at the site of injection or bruising at the site of injection  -Double vision  -Potential unknown long term risks  Contraindications: You should not have Botox if you are pregnant, nursing, allergic to albumin, have an infection, skin condition, or muscle weakness at the site of the injection, or have myasthenia gravis, Lambert-Eaton  syndrome, or ALS.  It is also possible that as with any injection, there may be an allergic reaction or no effect from the medication. Reduced effectiveness after repeated injections is sometimes seen and rarely infection at the injection site may occur. All care will be taken to prevent these side effects. If therapy is given over a long time, atrophy and wasting in the muscle injected may occur. Occasionally the patient's become refractory to treatment because they develop antibodies to the toxin. In this event, therapy needs to be modified.  I have read the above information and consent to the administration of botulism toxin.    BOTOX PROCEDURE NOTE FOR MIGRAINE HEADACHE    Contraindications and precautions discussed with patient(above). Aseptic procedure was observed and patient tolerated procedure. Procedure performed by Dr. Artemio Aly  The condition has existed for more than 6 months, and pt does not have a diagnosis of ALS, Myasthenia Gravis or Lambert-Eaton Syndrome.  Risks and benefits of injections discussed and pt agrees to proceed with the procedure.  Written consent obtained  These injections are medically necessary. Pt  receives good benefits from these injections. These injections do not cause sedations or hallucinations which the oral therapies may cause.  Description of procedure:  The patient was placed in a sitting position. The standard protocol was used for Botox as follows, with 5 units of Botox injected at each site:   -Procerus muscle, midline injection  -Corrugator muscle, bilateral injection  -Frontalis muscle, bilateral injection, with 2 sites each side, medial injection was performed in the upper one third of the frontalis muscle, in the region  vertical from the medial inferior edge of the superior orbital rim. The lateral injection was again in the upper one third of the forehead vertically above the lateral limbus of the cornea, 1.5 cm lateral to the medial  injection site.  - Levator Scapulae: 5 units bilaterally  -Temporalis muscle injection, 5 sites, bilaterally. The first injection was 3 cm above the tragus of the ear, second injection site was 1.5 cm to 3 cm up from the first injection site in line with the tragus of the ear. The third injection site was 1.5-3 cm forward between the first 2 injection sites. The fourth injection site was 1.5 cm posterior to the second injection site. 5th site laterally in the temporalis  muscleat the level of the outer canthus.  - Patient feels the migraines are centered around the eyes +5 units bilaterally at the outer canthus in the orbicularis occuli  -Occipitalis muscle injection, 3 sites, bilaterally. The first injection was done one half way between the occipital protuberance and the tip of the mastoid process behind the ear. The second injection site was done lateral and superior to the first, 1 fingerbreadth from the first injection. The third injection site was 1 fingerbreadth superiorly and medially from the first injection site.  -Cervical paraspinal muscle injection, 2 sites, bilateral knee first injection site was 1 cm from the midline of the cervical spine, 3 cm inferior to the lower border of the occipital protuberance. The second injection site was 1.5 cm superiorly and laterally to the first injection site.  -Trapezius muscle injection was performed at 3 sites, bilaterally. The first injection site was in the upper trapezius muscle halfway between the inflection point of the neck, and the acromion. The second injection site was one half way between the acromion and the first injection site. The third injection was done between the first injection site and the inflection point of the neck.   Will return for repeat injection in 3 months.   A 155 unit sof Botox was used, 45u Botox not injected was wasted. The patient tolerated the procedure well, there were no complications of the above  procedure.

## 2022-02-01 NOTE — Telephone Encounter (Signed)
Completed [employer] Botox continuation form. Placed in Nurse Pod for MD signature. °

## 2022-02-02 NOTE — Telephone Encounter (Signed)
Faxed signed PA form with OV notes to [employer]. ?

## 2022-02-14 MED ORDER — BOTOX 100 UNITS IJ SOLR: INTRAMUSCULAR | 1 refills | Status: DC

## 2022-02-14 NOTE — Telephone Encounter (Signed)
Received approval from [place], [place] # [id number] (02/02/2022-01/03/2023).

## 2022-02-14 NOTE — Addendum Note (Signed)
Addended by: Bertram Savin on: 02/14/2022 10:13 AM   Modules accepted: Orders

## 2022-02-14 NOTE — Telephone Encounter (Signed)
Botox refill sent to [facility].

## 2022-02-14 NOTE — Telephone Encounter (Signed)
Please send Botox RX to [facility]. ?

## 2022-03-17 MED ORDER — BOTOX 100 UNITS IJ SOLR: INTRAMUSCULAR | 3 refills | Status: DC

## 2022-03-17 NOTE — Telephone Encounter (Signed)
Can you please send patient's Botox prescription to [facility]? ?

## 2022-03-17 NOTE — Addendum Note (Signed)
Addended by: Gildardo Griffes on: 03/17/2022 11:53 AM ? ? Modules accepted: Orders ? ?

## 2022-04-26 ENCOUNTER — Other Ambulatory Visit: Payer: Self-pay | Admitting: Podiatry

## 2022-04-27 ENCOUNTER — Ambulatory Visit: Payer: BC Managed Care – PPO | Admitting: Neurology

## 2022-04-27 DIAGNOSIS — G43711 Chronic migraine without aura, intractable, with status migrainosus: Secondary | ICD-10-CM | POA: Diagnosis not present

## 2022-04-27 NOTE — Progress Notes (Signed)
Botox consent signed ? ?Botox- 200 units x 1 vial ?Lot: [id number] ?Expiration: 08/2024 ?NDC: 8921-1941-74 ? ?0.9% Sodium Chloride- 49m total ?Lot: [id number]?Expiration: 01/26/2023 [place] [address]? ?Dx: GY63.785?S/P  ?

## 2022-04-27 NOTE — Progress Notes (Signed)
? ? ? ? ?Consent Form ?Botulism Toxin Injection For Chronic Migraine ? ?04/27/2022: Stable still doing great ?02/01/2022; stable oding great ?11/08/2021: >95% improvement in frequency and severity of migraines ? ? ? ? ?Reviewed orally with patient, additionally signature is on file: ? ?Botulism toxin has been approved by the Federal drug administration for treatment of chronic migraine. Botulism toxin does not cure chronic migraine and it may not be effective in some patients. ? ?The administration of botulism toxin is accomplished by injecting a small amount of toxin into the muscles of the neck and head. Dosage must be titrated for each individual. Any benefits resulting from botulism toxin tend to wear off after 3 months with a repeat injection required if benefit is to be maintained. Injections are usually done every 3-4 months with maximum effect peak achieved by about 2 or 3 weeks. Botulism toxin is expensive and you should be sure of what costs you will incur resulting from the injection. ? ?The side effects of botulism toxin use for chronic migraine may include: ? ? -Transient, and usually mild, facial weakness with facial injections ? -Transient, and usually mild, head or neck weakness with head/neck injections ? -Reduction or loss of forehead facial animation due to forehead muscle weakness ? -Eyelid drooping ? -Dry eye ? -Pain at the site of injection or bruising at the site of injection ? -Double vision ? -Potential unknown long term risks ? ?Contraindications: You should not have Botox if you are pregnant, nursing, allergic to albumin, have an infection, skin condition, or muscle weakness at the site of the injection, or have myasthenia gravis, Lambert-Eaton syndrome, or ALS. ? ?It is also possible that as with any injection, there may be an allergic reaction or no effect from the medication. Reduced effectiveness after repeated injections is sometimes seen and rarely infection at the injection site may  occur. All care will be taken to prevent these side effects. If therapy is given over a long time, atrophy and wasting in the muscle injected may occur. Occasionally the patient's become refractory to treatment because they develop antibodies to the toxin. In this event, therapy needs to be modified. ? ?I have read the above information and consent to the administration of botulism toxin. ? ? ? ?BOTOX PROCEDURE NOTE FOR MIGRAINE HEADACHE ? ? ? ?Contraindications and precautions discussed with patient(above). Aseptic procedure was observed and patient tolerated procedure. Procedure performed by Dr. [place] Dom ? ?The condition has existed for more than 6 months, and pt does not have a diagnosis of ALS, Myasthenia Gravis or Lambert-Eaton Syndrome.  Risks and benefits of injections discussed and pt agrees to proceed with the procedure.  Written consent obtained ? ?These injections are medically necessary. Pt  receives good benefits from these injections. These injections do not cause sedations or hallucinations which the oral therapies may cause. ? ?Description of procedure: ? ?The patient was placed in a sitting position. The standard protocol was used for Botox as follows, with 5 units of Botox injected at each site: ? ? ?-Procerus muscle, midline injection ? ?-Corrugator muscle, bilateral injection ? ?-Frontalis muscle, bilateral injection, with 2 sites each side, medial injection was performed in the upper one third of the frontalis muscle, in the region vertical from the medial inferior edge of the superior orbital rim. The lateral injection was again in the upper one third of the forehead vertically above the lateral limbus of the cornea, 1.5 cm lateral to the medial injection site. ? ?-Temporalis muscle  injection, 4 sites, bilaterally. The first injection was 3 cm above the tragus of the ear, second injection site was 1.5 cm to 3 cm up from the first injection site in line with the tragus of the ear. The third  injection site was 1.5-3 cm forward between the first 2 injection sites. The fourth injection site was 1.5 cm posterior to the second injection site.  ? ? ?-Occipitalis muscle injection, 3 sites, bilaterally. The first injection was done one half way between the occipital protuberance and the tip of the mastoid process behind the ear. The second injection site was done lateral and superior to the first, 1 fingerbreadth from the first injection. The third injection site was 1 fingerbreadth superiorly and medially from the first injection site. ? ?-Cervical paraspinal muscle injection, 2 sites, bilateral knee first injection site was 1 cm from the midline of the cervical spine, 3 cm inferior to the lower border of the occipital protuberance. The second injection site was 1.5 cm superiorly and laterally to the first injection site. ? ?-Trapezius muscle injection was performed at 3 sites, bilaterally. The first injection site was in the upper trapezius muscle halfway between the inflection point of the neck, and the acromion. The second injection site was one half way between the acromion and the first injection site. The third injection was done between the first injection site and the inflection point of the neck. ? ? ?Will return for repeat injection in 3 months. ? ? ?A 155 unit sof Botox was used, 45u Botox not injected was wasted. The patient tolerated the procedure well, there were no complications of the above procedure. ? ? ? ? ?

## 2022-05-09 NOTE — Telephone Encounter (Signed)
Received approval from [place], [place] # [id number] (05/05/2022-04/05/2023). ?

## 2022-05-09 NOTE — Discharge Instructions (Signed)
[place] ?[place] ? ?POST OPERATIVE INSTRUCTIONS FOR DR. Vickki Muff AND DR. BAKER ?[place] ? ? ?Take your medication as prescribed.  Pain medication should be taken only as needed. ? ?Keep the dressing clean, dry and intact. ? ?Keep your foot elevated above the heart level for the first 48 hours. ? ?Walking to the bathroom and brief periods of walking are acceptable, unless we have instructed you to be non-weight bearing. ? ?Always wear your post-op shoe when walking.  Always use your crutches if you are to be non-weight bearing. ? ?Do not take a shower. Baths are permissible as long as the foot is kept out of the water.  ? ?Every hour you are awake:  ?Bend your knee 15 times. ?Flex foot 15 times ?Massage calf 15 times ? ?Call [facility] [phone]) if any of the following problems occur: ?You develop a temperature or fever. ?The bandage becomes saturated with blood. ?Medication does not stop your pain. ?Injury of the foot occurs. ?Any symptoms of infection including redness, odor, or red streaks running from wound.  ?

## 2022-05-10 ENCOUNTER — Encounter: Payer: Self-pay | Admitting: Podiatry

## 2022-05-11 ENCOUNTER — Telehealth (HOSPITAL_BASED_OUTPATIENT_CLINIC_OR_DEPARTMENT_OTHER): Payer: Self-pay | Admitting: *Deleted

## 2022-05-11 ENCOUNTER — Encounter (HOSPITAL_BASED_OUTPATIENT_CLINIC_OR_DEPARTMENT_OTHER): Payer: Self-pay | Admitting: Cardiovascular Disease

## 2022-05-11 DIAGNOSIS — I10 Essential (primary) hypertension: Secondary | ICD-10-CM

## 2022-05-11 DIAGNOSIS — Z5181 Encounter for therapeutic drug level monitoring: Secondary | ICD-10-CM

## 2022-05-11 MED ORDER — VALSARTAN 80 MG PO TABS: 80.0000 mg | ORAL_TABLET | Freq: Every day | ORAL | 1 refills | Status: DC

## 2022-05-11 NOTE — Telephone Encounter (Signed)
Lets switch her to valsartan 80 mg and have her see PharmD in 3-4 weeks.   (BMET in 2 weeks)  ?

## 2022-05-11 NOTE — Telephone Encounter (Signed)
Mychart message sent to patient.

## 2022-05-11 NOTE — Telephone Encounter (Signed)
Patient viewed mychart message  ?

## 2022-05-11 NOTE — Telephone Encounter (Signed)
Hello Dr. Oval Linsey, ?  ?Would you please prescribe a different medication for my hypertension? I have melasma and my dermatologist informed me that hydrochlorothiazide is a photosensitizing drug. ?  ?Jennifer Sims ? ?Above message sent via mychart. Will forward to Dr Oval Linsey and Sherian Rein D for review  ?

## 2022-05-17 NOTE — Telephone Encounter (Signed)
Received (2) 100 unit vials of Botox from [facility].

## 2022-05-18 ENCOUNTER — Other Ambulatory Visit: Payer: Self-pay

## 2022-05-18 ENCOUNTER — Ambulatory Visit
Admission: RE | Admit: 2022-05-18 | Discharge: 2022-05-18 | Disposition: A | Discharge status: Home / Self Care | Payer: BC Managed Care – PPO | Attending: Podiatry | Admitting: Podiatry

## 2022-05-18 ENCOUNTER — Encounter: Payer: Self-pay | Admitting: Podiatry

## 2022-05-18 ENCOUNTER — Ambulatory Visit: Payer: BC Managed Care – PPO | Admitting: Anesthesiology

## 2022-05-18 ENCOUNTER — Ambulatory Visit: Payer: Self-pay

## 2022-05-18 ENCOUNTER — Encounter
Admission: RE | Disposition: A | Discharge status: Home / Self Care | Payer: Self-pay | Source: Home / Self Care | Attending: Podiatry

## 2022-05-18 DIAGNOSIS — M2011 Hallux valgus (acquired), right foot: Secondary | ICD-10-CM | POA: Diagnosis present

## 2022-05-18 DIAGNOSIS — Z79899 Other long term (current) drug therapy: Secondary | ICD-10-CM | POA: Insufficient documentation

## 2022-05-18 DIAGNOSIS — G43909 Migraine, unspecified, not intractable, without status migrainosus: Secondary | ICD-10-CM | POA: Insufficient documentation

## 2022-05-18 DIAGNOSIS — I10 Essential (primary) hypertension: Secondary | ICD-10-CM | POA: Insufficient documentation

## 2022-05-18 DIAGNOSIS — K5090 Crohn's disease, unspecified, without complications: Secondary | ICD-10-CM | POA: Diagnosis not present

## 2022-05-18 HISTORY — PX: BUNIONECTOMY: SHX129

## 2022-05-18 HISTORY — DX: Gastro-esophageal reflux disease without esophagitis: K21.9

## 2022-05-18 HISTORY — DX: Presence of spectacles and contact lenses: Z97.3

## 2022-05-18 HISTORY — DX: Prediabetes: R73.03

## 2022-05-18 SURGERY — BUNIONECTOMY
Anesthesia: General | Site: Toe | Laterality: Right

## 2022-05-18 MED ORDER — ACETAMINOPHEN 10 MG/ML IV SOLN
1000.0000 mg | Freq: Once | INTRAVENOUS | Status: AC
Start: 2022-05-18 — End: 2022-05-18
  Administered 2022-05-18: 1000 mg via INTRAVENOUS

## 2022-05-18 MED ORDER — DEXMEDETOMIDINE (PRECEDEX) IN NS 20 MCG/5ML (4 MCG/ML) IV SYRINGE
PREFILLED_SYRINGE | INTRAVENOUS | Status: DC | PRN
  Administered 2022-05-18: 10 ug via INTRAVENOUS

## 2022-05-18 MED ORDER — OXYCODONE-ACETAMINOPHEN 5-325 MG PO TABS: 1.0000 | ORAL_TABLET | Freq: Four times a day (QID) | ORAL | 0 refills | Status: DC | PRN

## 2022-05-18 MED ORDER — CEFAZOLIN SODIUM-DEXTROSE 2-4 GM/100ML-% IV SOLN
2.0000 g | INTRAVENOUS | Status: AC
  Administered 2022-05-18: 2 g via INTRAVENOUS

## 2022-05-18 MED ORDER — PROPOFOL 10 MG/ML IV BOLUS
INTRAVENOUS | Status: DC | PRN
  Administered 2022-05-18: 100 mg via INTRAVENOUS

## 2022-05-18 MED ORDER — ONDANSETRON HCL 4 MG/2ML IJ SOLN
INTRAMUSCULAR | Status: DC | PRN
  Administered 2022-05-18: 4 mg via INTRAVENOUS

## 2022-05-18 MED ORDER — SODIUM CHLORIDE 0.9 % IR SOLN
Status: DC | PRN
  Administered 2022-05-18: 500 mL

## 2022-05-18 MED ORDER — MIDAZOLAM HCL 5 MG/5ML IJ SOLN
INTRAMUSCULAR | Status: DC | PRN
  Administered 2022-05-18: 2 mg via INTRAVENOUS

## 2022-05-18 MED ORDER — BUPIVACAINE HCL (PF) 0.25 % IJ SOLN
INTRAMUSCULAR | Status: DC | PRN
  Administered 2022-05-18: 5 mL

## 2022-05-18 MED ORDER — LACTATED RINGERS IV SOLN: INTRAVENOUS | Status: DC

## 2022-05-18 MED ORDER — DEXAMETHASONE SODIUM PHOSPHATE 4 MG/ML IJ SOLN
INTRAMUSCULAR | Status: DC | PRN
  Administered 2022-05-18: 4 mg via INTRAVENOUS

## 2022-05-18 MED ORDER — BUPIVACAINE LIPOSOME 1.3 % IJ SUSP
INTRAMUSCULAR | Status: DC | PRN
  Administered 2022-05-18: 10 mL
  Administered 2022-05-18: 5 mL

## 2022-05-18 MED ORDER — GLYCOPYRROLATE 0.2 MG/ML IJ SOLN
INTRAMUSCULAR | Status: DC | PRN
  Administered 2022-05-18: .2 mg via INTRAVENOUS

## 2022-05-18 MED ORDER — KETOROLAC TROMETHAMINE 30 MG/ML IJ SOLN
INTRAMUSCULAR | Status: DC | PRN
  Administered 2022-05-18: 30 mg via INTRAVENOUS

## 2022-05-18 MED ORDER — FENTANYL CITRATE (PF) 100 MCG/2ML IJ SOLN
INTRAMUSCULAR | Status: DC | PRN
  Administered 2022-05-18 (×2): 50 ug via INTRAVENOUS

## 2022-05-18 MED ORDER — LIDOCAINE HCL (CARDIAC) PF 100 MG/5ML IV SOSY
PREFILLED_SYRINGE | INTRAVENOUS | Status: DC | PRN
  Administered 2022-05-18: 50 mg via INTRATRACHEAL

## 2022-05-18 SURGICAL SUPPLY — 45 items
APL SKNCLS STERI-STRIP NONHPOA (GAUZE/BANDAGES/DRESSINGS) ×1
BENZOIN TINCTURE PRP APPL 2/3 (GAUZE/BANDAGES/DRESSINGS) ×2 IMPLANT
BIT DRILL 1.7 LNG CANN (DRILL) ×1 IMPLANT
BLADE MED AGGRESSIVE (BLADE) ×1 IMPLANT
BNDG CMPR 75X41 PLY HI ABS (GAUZE/BANDAGES/DRESSINGS) ×1
BNDG COHESIVE 4X5 TAN ST LF (GAUZE/BANDAGES/DRESSINGS) ×2 IMPLANT
BNDG ELASTIC 4X5.8 VLCR STR LF (GAUZE/BANDAGES/DRESSINGS) ×2 IMPLANT
BNDG ESMARK 4X12 TAN STRL LF (GAUZE/BANDAGES/DRESSINGS) ×2 IMPLANT
BNDG GAUZE ELAST 4 BULKY (GAUZE/BANDAGES/DRESSINGS) ×2 IMPLANT
BNDG STRETCH 4X75 STRL LF (GAUZE/BANDAGES/DRESSINGS) ×2 IMPLANT
CANISTER SUCT 1200ML W/VALVE (MISCELLANEOUS) ×2 IMPLANT
COUNTERSINK HEADED 2.5 (ORTHOPEDIC DISPOSABLE SUPPLIES) ×2
COUNTERSINK HEADLESS 2.5 (ORTHOPEDIC DISPOSABLE SUPPLIES) ×2
COVER LIGHT HANDLE UNIVERSAL (MISCELLANEOUS) ×4 IMPLANT
DRAPE FLUOR MINI C-ARM 54X84 (DRAPES) ×2 IMPLANT
DURAPREP 26ML APPLICATOR (WOUND CARE) ×2 IMPLANT
ELECT REM PT RETURN 9FT ADLT (ELECTROSURGICAL) ×2
ELECTRODE REM PT RTRN 9FT ADLT (ELECTROSURGICAL) ×1 IMPLANT
GAUZE SPONGE 4X4 12PLY STRL (GAUZE/BANDAGES/DRESSINGS) ×2 IMPLANT
GAUZE XEROFORM 1X8 LF (GAUZE/BANDAGES/DRESSINGS) ×2 IMPLANT
GLOVE SRG 8 PF TXTR STRL LF DI (GLOVE) ×2 IMPLANT
GLOVE SURG ENC MOIS LTX SZ7.5 (GLOVE) ×4 IMPLANT
GLOVE SURG UNDER POLY LF SZ8 (GLOVE) ×4
GOWN STRL REUS W/ TWL LRG LVL3 (GOWN DISPOSABLE) ×2 IMPLANT
GOWN STRL REUS W/TWL LRG LVL3 (GOWN DISPOSABLE) ×4
K-WIRE .9X150 (WIRE) ×4
K-WIRE DBL END TROCAR 6X.045 (WIRE) ×2
KIT TURNOVER KIT A (KITS) ×2 IMPLANT
KWIRE .9X150 (WIRE) IMPLANT
KWIRE DBL END TROCAR 6X.045 (WIRE) IMPLANT
NS IRRIG 500ML POUR BTL (IV SOLUTION) ×2 IMPLANT
PACK EXTREMITY ARMC (MISCELLANEOUS) ×2 IMPLANT
RASP SM TEAR CROSS CUT (RASP) ×1 IMPLANT
SCREW CANN HDLS ST 2.5X16 (Screw) ×1 IMPLANT
SCREW COUNTERSINK HEADED 2.5 (ORTHOPEDIC DISPOSABLE SUPPLIES) IMPLANT
SCREW COUNTERSINK HEADLESS 2.5 (ORTHOPEDIC DISPOSABLE SUPPLIES) IMPLANT
STOCKINETTE IMPERVIOUS LG (DRAPES) ×2 IMPLANT
STRIP CLOSURE SKIN 1/4X4 (GAUZE/BANDAGES/DRESSINGS) ×2 IMPLANT
SUT MNCRL 4-0 (SUTURE) ×2
SUT MNCRL 4-0 27XMFL (SUTURE) ×1
SUT VIC AB 3-0 SH 27 (SUTURE) ×2
SUT VIC AB 3-0 SH 27X BRD (SUTURE) IMPLANT
SUT VIC AB 4-0 SH 27 (SUTURE) ×2
SUT VIC AB 4-0 SH 27XANBCTRL (SUTURE) IMPLANT
SUTURE MNCRL 4-0 27XMF (SUTURE) IMPLANT

## 2022-05-18 NOTE — Anesthesia Postprocedure Evaluation (Signed)
Anesthesia Post Note  Patient: Jennifer Sims  Procedure(s) Performed: [name] (MITCHELL) (Right: Toe)     Patient location during evaluation: PACU Anesthesia Type: General Level of consciousness: awake and alert Pain management: pain level controlled Vital Signs Assessment: post-procedure vital signs reviewed and stable Respiratory status: spontaneous breathing, nonlabored ventilation, respiratory function stable and patient connected to nasal cannula oxygen Cardiovascular status: blood pressure returned to baseline and stable Postop Assessment: no apparent nausea or vomiting Anesthetic complications: no   No notable events documented.  Adele Barthel Nataliah Hatlestad

## 2022-05-18 NOTE — Op Note (Signed)
Operative note   Surgeon:Anant Agard Lawyer: None    Preop diagnosis: Right foot hallux valgus deformity    Postop diagnosis: Same    Procedure: [place] hallux valgus correction right foot    EBL: Minimal    Anesthesia:local and general.  Local consisted of a total of 5 cc of 0.25% bupivacaine and 15 cc of Exparel long-acting anesthetic    Hemostasis: Ankle tourniquet inflated to 200 mmHg for approximately 45-minute    Specimen: None    Complications: None    Operative indications:Jennifer Sims is an 56 y.o. that presents today for surgical intervention.  The risks/benefits/alternatives/complications have been discussed and consent has been given.    Procedure:  Patient was brought into the OR and placed on the operating table in thesupine position. After anesthesia was obtained theright lower extremity was prepped and draped in usual sterile fashion.  Attention was directed to the dorsomedial right first MTPJ where an incision was performed.  Sharp and blunt dissection carried down to the capsule.  A T capsulotomy was performed.  The prominent dorsomedial eminence was noted and transected with a power saw and smoothed with a power rasp.  Next a V osteotomy was created at the surgical neck.  The capital fragment was translocated laterally.  A 2.5 mm headless screw from the [employer] monster screw set was placed crossing the osteotomy site with excellent stabilization.  The ensuing overhanging ledge was then transected.  All further areas were smoothed with a power rasp.  Good realignment of the first MTPJ and great toe was noted.  At this time the wound was flushed with copious amounts of irrigation.  A small capsulorrhaphy was performed medially.  The capsule was closed with a 3-0 Vicryl, the subcutaneous tissue was closed with a 4-0 Vicryl and the skin closed with a 4-0 Monocryl in a subcuticular fashion.  A bulky sterile dressing was applied to the left foot.  She will be  placed in a wedge shoe.  A prescription for Percocet was sent to her pharmacy.    Patient tolerated the procedure and anesthesia well.  Was transported from the OR to the PACU with all vital signs stable and vascular status intact. To be discharged per routine protocol.  Will follow up in approximately 1 week in the outpatient clinic.

## 2022-05-18 NOTE — Anesthesia Preprocedure Evaluation (Signed)
Anesthesia Evaluation  Patient identified by MRN, date of birth, ID band Patient awake    History of Anesthesia Complications Negative for: history of anesthetic complications  Airway Mallampati: II  TM Distance: >3 FB Neck ROM: Full    Dental no notable dental hx.    Pulmonary neg pulmonary ROS,    Pulmonary exam normal        Cardiovascular hypertension, Pt. on medications Normal cardiovascular exam     Neuro/Psych  Headaches (chronic migraines),    GI/Hepatic Neg liver ROS, Crohn's diease   Endo/Other  negative endocrine ROS  Renal/GU negative Renal ROS     Musculoskeletal Ankylosing spondylitis   Abdominal   Peds  Hematology negative hematology ROS (+)   Anesthesia Other Findings   Reproductive/Obstetrics                             Anesthesia Physical Anesthesia Plan  ASA: 3  Anesthesia Plan: General   Post-op Pain Management: Ofirmev IV (intra-op), Toradol IV (intra-op) and Oxycodone PO   Induction: Intravenous  PONV Risk Score and Plan: 3 and Ondansetron, Dexamethasone, Midazolam and Treatment may vary due to age or medical condition  Airway Management Planned: LMA  Additional Equipment: None  Intra-op Plan:   Post-operative Plan: Extubation in OR  Informed Consent: I have reviewed the patients History and Physical, chart, labs and discussed the procedure including the risks, benefits and alternatives for the proposed anesthesia with the patient or authorized representative who has indicated his/her understanding and acceptance.       Plan Discussed with: CRNA  Anesthesia Plan Comments:         Anesthesia Quick Evaluation

## 2022-05-18 NOTE — Transfer of Care (Signed)
Immediate Anesthesia Transfer of Care Note  Patient: Jennifer Sims  Procedure(s) Performed: [facility] [place] ([place]) (Right: Toe)  Patient Location: PACU  Anesthesia Type: General  Level of Consciousness: awake, alert  and patient cooperative  Airway and Oxygen Therapy: Patient Spontanous Breathing and Patient connected to supplemental oxygen  Post-op Assessment: Post-op Vital signs reviewed, Patient's Cardiovascular Status Stable, Respiratory Function Stable, Patent Airway and No signs of Nausea or vomiting  Post-op Vital Signs: Reviewed and stable  Complications: No notable events documented.

## 2022-05-18 NOTE — Anesthesia Procedure Notes (Signed)
Procedure Name: LMA Insertion Date/Time: 05/18/2022 12:41 PM Performed by: Jeannene Patella, CRNA Pre-anesthesia Checklist: Patient identified, Emergency Drugs available, Suction available, Patient being monitored and Timeout performed Patient Re-evaluated:Patient Re-evaluated prior to induction Oxygen Delivery Method: Circle system utilized Preoxygenation: Pre-oxygenation with 100% oxygen Induction Type: IV induction Ventilation: Mask ventilation without difficulty LMA: LMA inserted LMA Size: 4.0 Placement Confirmation: positive ETCO2 and breath sounds checked- equal and bilateral Tube secured with: Tape Dental Injury: Teeth and Oropharynx as per pre-operative assessment

## 2022-05-18 NOTE — H&P (Signed)
HISTORY AND PHYSICAL INTERVAL NOTE:  05/18/2022  11:55 AM  Jennifer Sims  has presented today for surgery, with the diagnosis of M20.11 - Hallux valgus of right foot.  The various methods of treatment have been discussed with the patient.  No guarantees were given.  After consideration of risks, benefits and other options for treatment, the patient has consented to surgery.  I have reviewed the patients' chart and labs.     A history and physical examination was performed in my office.  The patient was reexamined.  There have been no changes to this history and physical examination.  [name] A

## 2022-05-19 ENCOUNTER — Encounter: Payer: Self-pay | Admitting: Podiatry

## 2022-05-25 ENCOUNTER — Ambulatory Visit (HOSPITAL_BASED_OUTPATIENT_CLINIC_OR_DEPARTMENT_OTHER): Payer: BC Managed Care – PPO

## 2022-06-01 ENCOUNTER — Ambulatory Visit: Payer: BC Managed Care – PPO

## 2022-06-07 ENCOUNTER — Ambulatory Visit: Payer: BC Managed Care – PPO | Admitting: Pharmacist Clinician (PhC)/ Clinical Pharmacy Specialist

## 2022-06-07 ENCOUNTER — Encounter: Payer: Self-pay | Admitting: Pharmacist Clinician (PhC)/ Clinical Pharmacy Specialist

## 2022-06-07 DIAGNOSIS — I10 Essential (primary) hypertension: Secondary | ICD-10-CM | POA: Diagnosis not present

## 2022-06-07 NOTE — Patient Instructions (Signed)
Return for a a follow up appointment with Dr. Oval Linsey December 12 at 9 am  Check your blood pressure at home 2-3 times each week and keep record of the readings.  Take your BP meds as follows:   Continue with your current medications.  Bring all of your meds, your BP cuff and your record of home blood pressures to your next appointment.  Exercise as you're able, try to walk approximately 30 minutes per day.  Keep salt intake to a minimum, especially watch canned and prepared boxed foods.  Eat more fresh fruits and vegetables and fewer canned items.  Avoid eating in fast food restaurants.    HOW TO TAKE YOUR BLOOD PRESSURE: Rest 5 minutes before taking your blood pressure.  Don't smoke or drink caffeinated beverages for at least 30 minutes before. Take your blood pressure before (not after) you eat. Sit comfortably with your back supported and both feet on the floor (don't cross your legs). Elevate your arm to heart level on a table or a desk. Use the proper sized cuff. It should fit smoothly and snugly around your bare upper arm. There should be enough room to slip a fingertip under the cuff. The bottom edge of the cuff should be 1 inch above the crease of the elbow. Ideally, take 3 measurements at one sitting and record the average.

## 2022-06-07 NOTE — Assessment & Plan Note (Signed)
Patient with essential hypertension, currently with slight elevation to diastolic readings.  Advised she be more cautious with sodium intake and continue to monitor pressure at home 2-3 times per week.  Should she notice diastolic readings continue to remain > 80 over time, she should let the office know.  She will be scheduled to see Dr. Oval Linsey for follow up in 6 months.

## 2022-06-07 NOTE — Progress Notes (Signed)
06/07/2022 Jennifer Sims 02-26-1966 [id number]   HPI:  Jennifer Sims is a 56 y.o. female patient of Dr Oval Linsey, who presents today for hypertension clinic evaluation.  Her medical history is otherwise significant for migraines, Crohn's disease and ankylosing spondylitis.  She was most recently seen by Karren Cobble PharmD in October 2022, at which time her pressure was noted to be 120/88.  No medication changes were made at that time and she remained only on hctz 12.5 mg qd, with advice to watch salt intake more closely.  She then called the office in May, noting she had been diagnosed with melasma and needed a non photosensitizing medication.  Dr. Oval Linsey had her switch to valsartan 80, and labs 2 weeks later were stable.  Today she returns for follow up.  States no concerns with switching to the valsartan, and has not checked home BP more than a few times since then.    Blood Pressure Goal:  130/80  Current Medications: valsartan 80 mg daily  Family Hx: mother has hypertension/hyperlipidemia; father deceased w/o heart issues; brother hypertension; children healthy  Social Hx: no tobacco, occasional alcohol; coffee and tea regularly  Diet: mix of home cooked and eating out - out is often fast food (has 2 teenage sons); no added salt at home, some vegetables; mix of meats  Exercise: recently had foot surgery, still on scooter, previously exercised regularly (running, stretching), will be able to get back to that in 2-3 months.  Home BP readings: none  Intolerances: sulfa  Labs: 06/01/22;  Na 140, K 4.5, Gllu 90, BUN 11, SCr 0.7, GFR 105   Wt Readings from Last 3 Encounters:  05/18/22 136 lb 3.2 oz (61.8 kg)  09/28/21 133 lb 6.4 oz (60.5 kg)  08/24/21 133 lb 12.8 oz (60.7 kg)   BP Readings from Last 3 Encounters:  06/07/22 112/83  05/18/22 125/88  09/28/21 120/88   Pulse Readings from Last 3 Encounters:  06/07/22 74  05/18/22 96  09/28/21 97     Current Outpatient Medications  Medication Sig Dispense Refill   Adalimumab (HUMIRA [place]) Inject into the skin. 1 shot every 1 week.     Boswellia-Glucosamine-Vit D (OSTEO BI-FLEX ONE PER DAY PO) Take by mouth.     BOTOX 100 units SOLR injection PROVIDER TO INJECT 155 UNITS INTO THE HEAD AND NECK MUSCLES AS DIRECTED EVERY 3 MONTHS. DISCARD REMAINDER. 2 each 3   Cholecalciferol (VITAMIN D3) 1000 units CAPS Take 1 capsule by mouth daily.     docusate sodium (COLACE) 100 MG capsule Take 1 capsule by mouth daily.     finasteride (PROSCAR) 5 MG tablet Take 5 mg by mouth daily.     HYDROQUINONE EX Apply topically. Apply once daily  -12% cream     ketoconazole (NIZORAL) 2 % shampoo Apply 1 application topically 2 (two) times a week.     levocetirizine (XYZAL) 5 MG tablet Take 5 mg by mouth every evening.     magnesium oxide (MAG-OX) 400 MG tablet Take 1 tablet by mouth daily.     Methylcellulose, Laxative, (CITRUCEL PO) Take by mouth.     Multiple Vitamins-Minerals (MULTIVITAMIN ADULTS PO) Take 1 tablet by mouth daily.     NON FORMULARY Nutrafol - biotin and others for hair loss/stimulate growth     Polypodium Leucotomos (HELIOCARE) 240 MG CAPS Take by mouth.     Probiotic Product (PROBIOTIC DAILY PO) Take by mouth daily.     Rimegepant Sulfate (NURTEC)  75 MG TBDP Take 75 mg by mouth daily as needed. For migraines. Take as close to onset of migraine as possible. One daily maximum. 16 tablet 6   tranexamic acid (LYSTEDA) 650 MG TABS tablet Take 325 mg by mouth 2 (two) times daily.     Tretinoin-Cleanser-Moisturizer 0.025 % CREAM KIT Apply topically daily.     valsartan (DIOVAN) 80 MG tablet Take 1 tablet (80 mg total) by mouth daily. 90 tablet 1   vitamin B-12 (CYANOCOBALAMIN) 1000 MCG tablet Take 1,000 mcg by mouth daily.     No current facility-administered medications for this visit.    Allergies  Allergen Reactions   Elemental Sulfur Nausea Only    Past Medical History:  Diagnosis  Date   Ankylosing spondylitis ([place]) 08/24/2021   Crohn disease ([place]) 08/24/2021   Essential hypertension 08/24/2021   GERD (gastroesophageal reflux disease)    Migraine    Osteopenia    Pre-diabetes    Wears contact lenses     Blood pressure 112/83, pulse 74.  Essential hypertension Patient with essential hypertension, currently with slight elevation to diastolic readings.  Advised she be more cautious with sodium intake and continue to monitor pressure at home 2-3 times per week.  Should she notice diastolic readings continue to remain > 80 over time, she should let the office know.  She will be scheduled to see Dr. Oval Linsey for follow up in 6 months.     Tommy Medal PharmD CPP [facility] [address] [place], [place] [address] [phone]

## 2022-07-26 ENCOUNTER — Ambulatory Visit: Payer: BC Managed Care – PPO | Admitting: Neurology

## 2022-07-26 DIAGNOSIS — G43009 Migraine without aura, not intractable, without status migrainosus: Secondary | ICD-10-CM

## 2022-07-26 DIAGNOSIS — G43711 Chronic migraine without aura, intractable, with status migrainosus: Secondary | ICD-10-CM

## 2022-07-26 MED ORDER — ONABOTULINUMTOXINA 200 UNITS IJ SOLR: 155.0000 [IU] | Freq: Once | INTRAMUSCULAR | Status: DC

## 2022-07-26 MED ORDER — ONABOTULINUMTOXINA 100 UNITS IJ SOLR
155.0000 [IU] | Freq: Once | INTRAMUSCULAR | Status: AC
  Administered 2022-07-26: 155 [IU] via INTRAMUSCULAR

## 2022-07-26 NOTE — Progress Notes (Signed)
Botox- 100 units x 2 vials Lot: [id number] Expiration: 10/26/2024 NDC: 6015-6153-79  Bacteriostatic 0.9% Sodium Chloride- 19m total Lot: [id number]: 07/27/2023 NDC: [address]-47 Dx: [id number]/P

## 2022-07-26 NOTE — Progress Notes (Signed)
Consent Form Botulism Toxin Injection For Chronic Migraine 07/26/2022: stable still doing great >>95% improvement 04/27/2022: Stable still doing great 02/01/2022; stable oding great 11/08/2021: >95% improvement in frequency and severity of migraines     Reviewed orally with patient, additionally signature is on file:  Botulism toxin has been approved by the Federal drug administration for treatment of chronic migraine. Botulism toxin does not cure chronic migraine and it may not be effective in some patients.  The administration of botulism toxin is accomplished by injecting a small amount of toxin into the muscles of the neck and head. Dosage must be titrated for each individual. Any benefits resulting from botulism toxin tend to wear off after 3 months with a repeat injection required if benefit is to be maintained. Injections are usually done every 3-4 months with maximum effect peak achieved by about 2 or 3 weeks. Botulism toxin is expensive and you should be sure of what costs you will incur resulting from the injection.  The side effects of botulism toxin use for chronic migraine may include:   -Transient, and usually mild, facial weakness with facial injections  -Transient, and usually mild, head or neck weakness with head/neck injections  -Reduction or loss of forehead facial animation due to forehead muscle weakness  -Eyelid drooping  -Dry eye  -Pain at the site of injection or bruising at the site of injection  -Double vision  -Potential unknown long term risks  Contraindications: You should not have Botox if you are pregnant, nursing, allergic to albumin, have an infection, skin condition, or muscle weakness at the site of the injection, or have myasthenia gravis, Lambert-Eaton syndrome, or ALS.  It is also possible that as with any injection, there may be an allergic reaction or no effect from the medication. Reduced effectiveness after repeated injections is sometimes  seen and rarely infection at the injection site may occur. All care will be taken to prevent these side effects. If therapy is given over a long time, atrophy and wasting in the muscle injected may occur. Occasionally the patient's become refractory to treatment because they develop antibodies to the toxin. In this event, therapy needs to be modified.  I have read the above information and consent to the administration of botulism toxin.    BOTOX PROCEDURE NOTE FOR MIGRAINE HEADACHE    Contraindications and precautions discussed with patient(above). Aseptic procedure was observed and patient tolerated procedure. Procedure performed by Dr. Georgia Dom  The condition has existed for more than 6 months, and pt does not have a diagnosis of ALS, Myasthenia Gravis or Lambert-Eaton Syndrome.  Risks and benefits of injections discussed and pt agrees to proceed with the procedure.  Written consent obtained  These injections are medically necessary. Pt  receives good benefits from these injections. These injections do not cause sedations or hallucinations which the oral therapies may cause.  Description of procedure:  The patient was placed in a sitting position. The standard protocol was used for Botox as follows, with 5 units of Botox injected at each site:   -Procerus muscle, midline injection  -Corrugator muscle, bilateral injection  -Frontalis muscle, bilateral injection, with 2 sites each side, medial injection was performed in the upper one third of the frontalis muscle, in the region vertical from the medial inferior edge of the superior orbital rim. The lateral injection was again in the upper one third of the forehead vertically above the lateral limbus of the cornea, 1.5 cm lateral to the  medial injection site.  -Temporalis muscle injection, 4 sites, bilaterally. The first injection was 3 cm above the tragus of the ear, second injection site was 1.5 cm to 3 cm up from the first injection  site in line with the tragus of the ear. The third injection site was 1.5-3 cm forward between the first 2 injection sites. The fourth injection site was 1.5 cm posterior to the second injection site.    -Occipitalis muscle injection, 3 sites, bilaterally. The first injection was done one half way between the occipital protuberance and the tip of the mastoid process behind the ear. The second injection site was done lateral and superior to the first, 1 fingerbreadth from the first injection. The third injection site was 1 fingerbreadth superiorly and medially from the first injection site.  -Cervical paraspinal muscle injection, 2 sites, bilateral knee first injection site was 1 cm from the midline of the cervical spine, 3 cm inferior to the lower border of the occipital protuberance. The second injection site was 1.5 cm superiorly and laterally to the first injection site.  -Trapezius muscle injection was performed at 3 sites, bilaterally. The first injection site was in the upper trapezius muscle halfway between the inflection point of the neck, and the acromion. The second injection site was one half way between the acromion and the first injection site. The third injection was done between the first injection site and the inflection point of the neck.   Will return for repeat injection in 3 months.   A 155 unit sof Botox was used, 45u Botox not injected was wasted. The patient tolerated the procedure well, there were no complications of the above procedure.

## 2022-07-26 NOTE — Patient Instructions (Signed)
Consent Form Botulism Toxin Injection For Chronic Migraine  04/27/2022: Stable still doing great 02/01/2022; stable oding great 11/08/2021: >95% improvement in frequency and severity of migraines     Reviewed orally with patient, additionally signature is on file:  Botulism toxin has been approved by the Federal drug administration for treatment of chronic migraine. Botulism toxin does not cure chronic migraine and it may not be effective in some patients.  The administration of botulism toxin is accomplished by injecting a small amount of toxin into the muscles of the neck and head. Dosage must be titrated for each individual. Any benefits resulting from botulism toxin tend to wear off after 3 months with a repeat injection required if benefit is to be maintained. Injections are usually done every 3-4 months with maximum effect peak achieved by about 2 or 3 weeks. Botulism toxin is expensive and you should be sure of what costs you will incur resulting from the injection.  The side effects of botulism toxin use for chronic migraine may include:   -Transient, and usually mild, facial weakness with facial injections  -Transient, and usually mild, head or neck weakness with head/neck injections  -Reduction or loss of forehead facial animation due to forehead muscle weakness  -Eyelid drooping  -Dry eye  -Pain at the site of injection or bruising at the site of injection  -Double vision  -Potential unknown long term risks  Contraindications: You should not have Botox if you are pregnant, nursing, allergic to albumin, have an infection, skin condition, or muscle weakness at the site of the injection, or have myasthenia gravis, Lambert-Eaton syndrome, or ALS.  It is also possible that as with any injection, there may be an allergic reaction or no effect from the medication. Reduced effectiveness after repeated injections is sometimes seen and rarely infection at the injection site may  occur. All care will be taken to prevent these side effects. If therapy is given over a long time, atrophy and wasting in the muscle injected may occur. Occasionally the patient's become refractory to treatment because they develop antibodies to the toxin. In this event, therapy needs to be modified.  I have read the above information and consent to the administration of botulism toxin.    BOTOX PROCEDURE NOTE FOR MIGRAINE HEADACHE    Contraindications and precautions discussed with patient(above). Aseptic procedure was observed and patient tolerated procedure. Procedure performed by Dr. Georgia Dom  The condition has existed for more than 6 months, and pt does not have a diagnosis of ALS, Myasthenia Gravis or Lambert-Eaton Syndrome.  Risks and benefits of injections discussed and pt agrees to proceed with the procedure.  Written consent obtained  These injections are medically necessary. Pt  receives good benefits from these injections. These injections do not cause sedations or hallucinations which the oral therapies may cause.  Description of procedure:  The patient was placed in a sitting position. The standard protocol was used for Botox as follows, with 5 units of Botox injected at each site:   -Procerus muscle, midline injection  -Corrugator muscle, bilateral injection  -Frontalis muscle, bilateral injection, with 2 sites each side, medial injection was performed in the upper one third of the frontalis muscle, in the region vertical from the medial inferior edge of the superior orbital rim. The lateral injection was again in the upper one third of the forehead vertically above the lateral limbus of the cornea, 1.5 cm lateral to the medial injection site.  -Temporalis muscle  injection, 4 sites, bilaterally. The first injection was 3 cm above the tragus of the ear, second injection site was 1.5 cm to 3 cm up from the first injection site in line with the tragus of the ear. The third  injection site was 1.5-3 cm forward between the first 2 injection sites. The fourth injection site was 1.5 cm posterior to the second injection site.    -Occipitalis muscle injection, 3 sites, bilaterally. The first injection was done one half way between the occipital protuberance and the tip of the mastoid process behind the ear. The second injection site was done lateral and superior to the first, 1 fingerbreadth from the first injection. The third injection site was 1 fingerbreadth superiorly and medially from the first injection site.  -Cervical paraspinal muscle injection, 2 sites, bilateral knee first injection site was 1 cm from the midline of the cervical spine, 3 cm inferior to the lower border of the occipital protuberance. The second injection site was 1.5 cm superiorly and laterally to the first injection site.  -Trapezius muscle injection was performed at 3 sites, bilaterally. The first injection site was in the upper trapezius muscle halfway between the inflection point of the neck, and the acromion. The second injection site was one half way between the acromion and the first injection site. The third injection was done between the first injection site and the inflection point of the neck.   Will return for repeat injection in 3 months.   A 155 unit sof Botox was used, 45u Botox not injected was wasted. The patient tolerated the procedure well, there were no complications of the above procedure.

## 2022-08-30 ENCOUNTER — Other Ambulatory Visit: Payer: Self-pay | Admitting: *Deleted

## 2022-08-30 DIAGNOSIS — G43009 Migraine without aura, not intractable, without status migrainosus: Secondary | ICD-10-CM

## 2022-08-30 MED ORDER — NURTEC 75 MG PO TBDP: 75.0000 mg | ORAL_TABLET | Freq: Every day | ORAL | 6 refills | Status: DC | PRN

## 2022-09-22 ENCOUNTER — Other Ambulatory Visit: Payer: Self-pay | Admitting: *Deleted

## 2022-09-22 DIAGNOSIS — G43009 Migraine without aura, not intractable, without status migrainosus: Secondary | ICD-10-CM

## 2022-09-22 MED ORDER — BOTOX 100 UNITS IJ SOLR: INTRAMUSCULAR | 3 refills | Status: DC

## 2022-09-27 ENCOUNTER — Telehealth: Payer: Self-pay | Admitting: Neurology

## 2022-09-27 NOTE — Telephone Encounter (Signed)
Noted, I updated the appt note.

## 2022-09-27 NOTE — Telephone Encounter (Signed)
Jennifer Sims from [facility] called to schedule Botox delivery with Botox being shipped out today the 3rd and getting here tomorrow the 4th Fed [employer] overnight, No signature required of 100 units, Single dose [place], Qt of 2

## 2022-10-19 ENCOUNTER — Ambulatory Visit: Payer: BC Managed Care – PPO | Admitting: Neurology

## 2022-10-20 ENCOUNTER — Ambulatory Visit (INDEPENDENT_AMBULATORY_CARE_PROVIDER_SITE_OTHER): Payer: BC Managed Care – PPO | Admitting: Neurology

## 2022-10-20 DIAGNOSIS — G43009 Migraine without aura, not intractable, without status migrainosus: Secondary | ICD-10-CM | POA: Diagnosis not present

## 2022-10-20 MED ORDER — ONABOTULINUMTOXINA 100 UNITS IJ SOLR
155.0000 [IU] | Freq: Once | INTRAMUSCULAR | Status: AC
  Administered 2022-10-20: 155 [IU] via INTRAMUSCULAR

## 2022-10-20 NOTE — Progress Notes (Signed)
Botox- 100 units x 2 vials Lot: [id number] Expiration: 11/2024 NDC: 1157-2620-35  Bacteriostatic 0.9% Sodium Chloride- 33m total Lot: [id number]: 08/27/2023 NDC: [address]-80 Dx: GH21.224S/P

## 2022-10-20 NOTE — Progress Notes (Signed)
Consent Form Botulism Toxin Injection For Chronic Migraine 10/21/2023 07/26/2022: stable still doing great >>95% improvement 04/27/2022: Stable still doing great 02/01/2022; stable oding great 11/08/2021: >95% improvement in frequency and severity of migraines     Reviewed orally with patient, additionally signature is on file:  Botulism toxin has been approved by the Federal drug administration for treatment of chronic migraine. Botulism toxin does not cure chronic migraine and it may not be effective in some patients.  The administration of botulism toxin is accomplished by injecting a small amount of toxin into the muscles of the neck and head. Dosage must be titrated for each individual. Any benefits resulting from botulism toxin tend to wear off after 3 months with a repeat injection required if benefit is to be maintained. Injections are usually done every 3-4 months with maximum effect peak achieved by about 2 or 3 weeks. Botulism toxin is expensive and you should be sure of what costs you will incur resulting from the injection.  The side effects of botulism toxin use for chronic migraine may include:   -Transient, and usually mild, facial weakness with facial injections  -Transient, and usually mild, head or neck weakness with head/neck injections  -Reduction or loss of forehead facial animation due to forehead muscle weakness  -Eyelid drooping  -Dry eye  -Pain at the site of injection or bruising at the site of injection  -Double vision  -Potential unknown long term risks  Contraindications: You should not have Botox if you are pregnant, nursing, allergic to albumin, have an infection, skin condition, or muscle weakness at the site of the injection, or have myasthenia gravis, Lambert-Eaton syndrome, or ALS.  It is also possible that as with any injection, there may be an allergic reaction or no effect from the medication. Reduced effectiveness after repeated injections is  sometimes seen and rarely infection at the injection site may occur. All care will be taken to prevent these side effects. If therapy is given over a long time, atrophy and wasting in the muscle injected may occur. Occasionally the patient's become refractory to treatment because they develop antibodies to the toxin. In this event, therapy needs to be modified.  I have read the above information and consent to the administration of botulism toxin.    BOTOX PROCEDURE NOTE FOR MIGRAINE HEADACHE    Contraindications and precautions discussed with patient(above). Aseptic procedure was observed and patient tolerated procedure. Procedure performed by Dr. Georgia Dom  The condition has existed for more than 6 months, and pt does not have a diagnosis of ALS, Myasthenia Gravis or Lambert-Eaton Syndrome.  Risks and benefits of injections discussed and pt agrees to proceed with the procedure.  Written consent obtained  These injections are medically necessary. Pt  receives good benefits from these injections. These injections do not cause sedations or hallucinations which the oral therapies may cause.  Description of procedure:  The patient was placed in a sitting position. The standard protocol was used for Botox as follows, with 5 units of Botox injected at each site:   -Procerus muscle, midline injection  -Corrugator muscle, bilateral injection  -Frontalis muscle, bilateral injection, with 2 sites each side, medial injection was performed in the upper one third of the frontalis muscle, in the region vertical from the medial inferior edge of the superior orbital rim. The lateral injection was again in the upper one third of the forehead vertically above the lateral limbus of the cornea, 1.5 cm lateral to  the medial injection site.  -Temporalis muscle injection, 4 sites, bilaterally. The first injection was 3 cm above the tragus of the ear, second injection site was 1.5 cm to 3 cm up from the first  injection site in line with the tragus of the ear. The third injection site was 1.5-3 cm forward between the first 2 injection sites. The fourth injection site was 1.5 cm posterior to the second injection site.    -Occipitalis muscle injection, 3 sites, bilaterally. The first injection was done one half way between the occipital protuberance and the tip of the mastoid process behind the ear. The second injection site was done lateral and superior to the first, 1 fingerbreadth from the first injection. The third injection site was 1 fingerbreadth superiorly and medially from the first injection site.  -Cervical paraspinal muscle injection, 2 sites, bilateral knee first injection site was 1 cm from the midline of the cervical spine, 3 cm inferior to the lower border of the occipital protuberance. The second injection site was 1.5 cm superiorly and laterally to the first injection site.  -Trapezius muscle injection was performed at 3 sites, bilaterally. The first injection site was in the upper trapezius muscle halfway between the inflection point of the neck, and the acromion. The second injection site was one half way between the acromion and the first injection site. The third injection was done between the first injection site and the inflection point of the neck.   Will return for repeat injection in 3 months.   A 155 unit sof Botox was used, 45u Botox not injected was wasted. The patient tolerated the procedure well, there were no complications of the above procedure.

## 2022-10-27 ENCOUNTER — Other Ambulatory Visit (HOSPITAL_BASED_OUTPATIENT_CLINIC_OR_DEPARTMENT_OTHER): Payer: Self-pay | Admitting: Cardiovascular Disease

## 2022-10-27 NOTE — Telephone Encounter (Signed)
Rx(s) sent to pharmacy electronically.  

## 2022-11-25 ENCOUNTER — Other Ambulatory Visit (HOSPITAL_BASED_OUTPATIENT_CLINIC_OR_DEPARTMENT_OTHER): Payer: Self-pay | Admitting: Cardiovascular Disease

## 2022-11-25 NOTE — Telephone Encounter (Signed)
Rx request sent to pharmacy.  

## 2022-12-02 ENCOUNTER — Telehealth (HOSPITAL_BASED_OUTPATIENT_CLINIC_OR_DEPARTMENT_OTHER): Payer: Self-pay | Admitting: Cardiovascular Disease

## 2022-12-02 NOTE — Telephone Encounter (Signed)
Left message regarding new appointment date and time with Dr. Melany Guernsey appt 12/06/22 at 9:00 am---new appointment 12/08/22 at 9:00 am---requested return confirmation call

## 2022-12-06 ENCOUNTER — Ambulatory Visit (HOSPITAL_BASED_OUTPATIENT_CLINIC_OR_DEPARTMENT_OTHER): Payer: BC Managed Care – PPO | Admitting: Cardiovascular Disease

## 2022-12-06 IMAGING — CT CT CARDIAC CORONARY ARTERY CALCIUM SCORE
3 series · 14 of 20 positions shown, 16 images · non-contrast
Comparison: None.
COMPARISON: None.

Addendum:
EXAM:
OVER-READ INTERPRETATION  CT CHEST

The following report is an over-read performed by radiologist Dr.
Nomasibulele Moatshe [REDACTED] on 09/14/2021. This
over-read does not include interpretation of cardiac or coronary
anatomy or pathology. The coronary calcium score interpretation by
the cardiologist is attached.
CLINICAL DATA: Cardiovascular Disease Risk stratification
Coronary Calcium Score
TECHNIQUE: A gated, non-contrast computed tomography scan of the heart was
performed using 3mm slice thickness. Axial images were analyzed on a
dedicated workstation. Calcium scoring of the coronary arteries was
performed using the Agatston method.

[Series 2: cascseq 2.0 sa36 (id) (id) · axial · 0.39mm/px · z∈[-154,-74]mm · 4 of 68 slices shown]
[im 14/68  vessel]
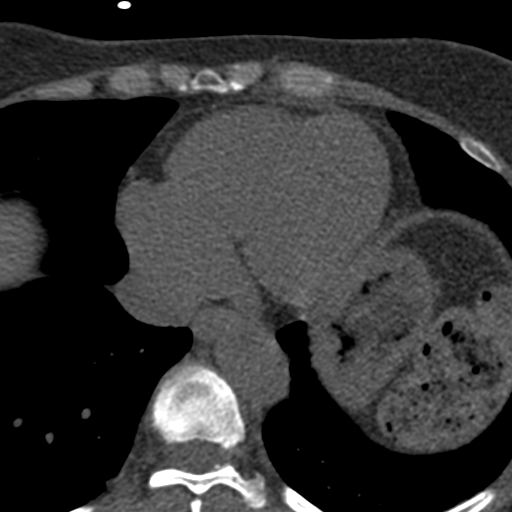
[im 27/68  vessel]
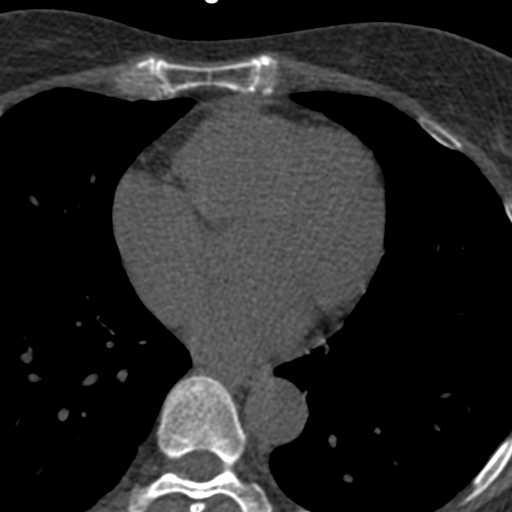
[im 41/68  vessel]
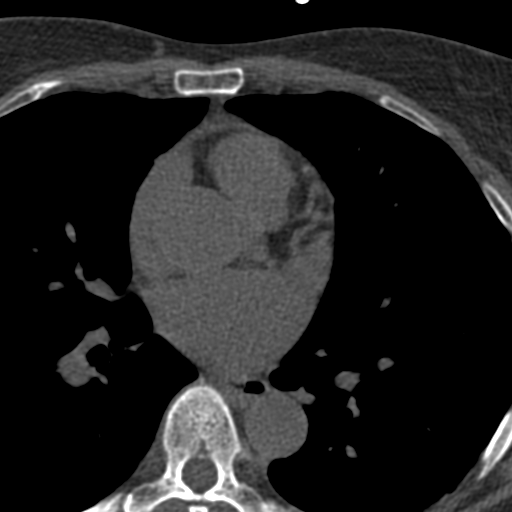
[im 54/68  vessel]
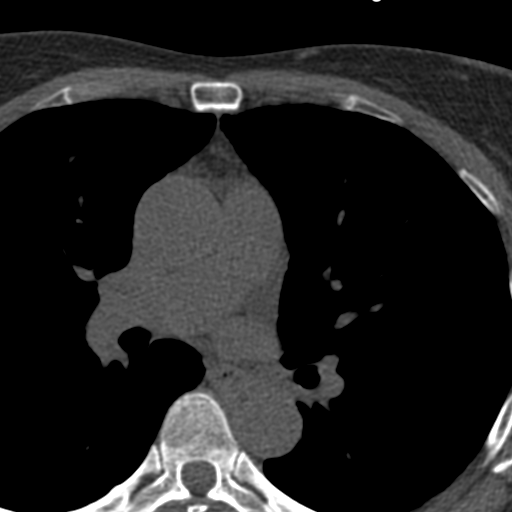

[Series 3: cascseq 2.0 bf37 st · axial · 0.64mm/px · z∈[-158,-70]mm · 5 of 68 slices shown, 7 images]
[im 12/68  vessel]
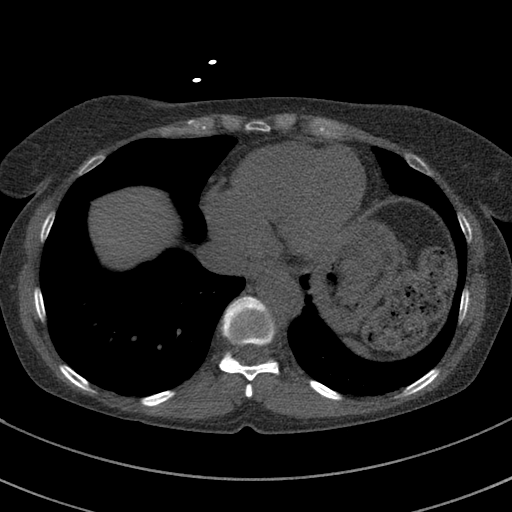
[im 12/68  lung]
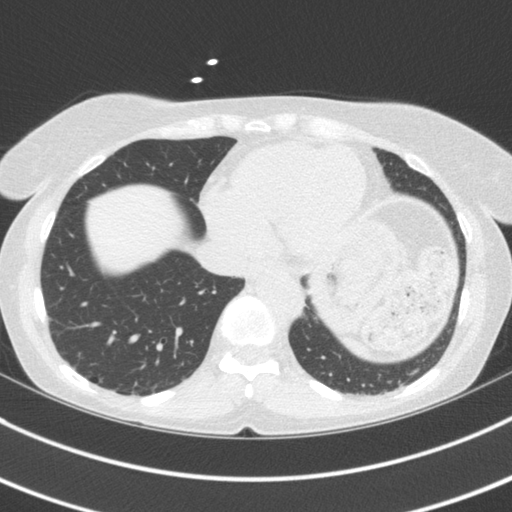
[im 23/68  vessel]
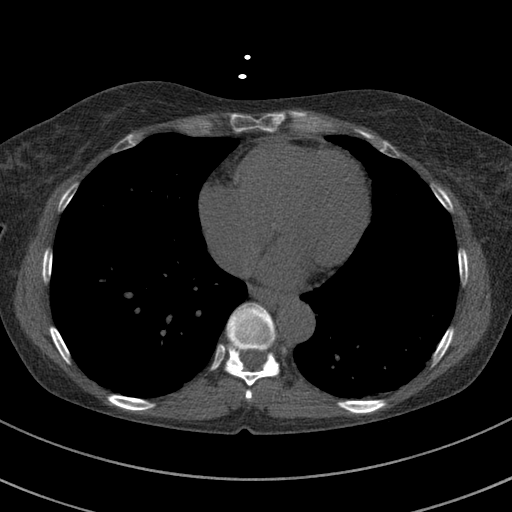
[im 34/68  vessel]
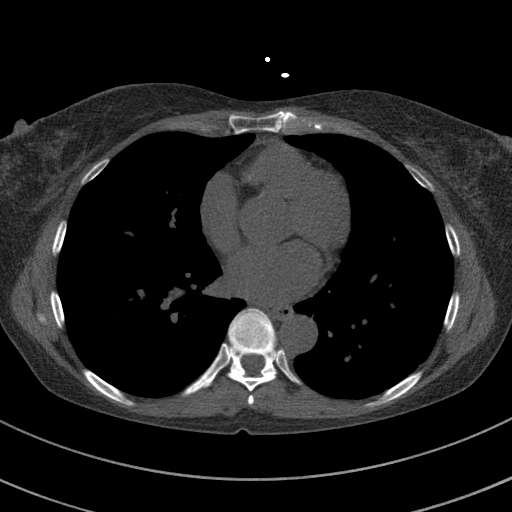
[im 45/68  vessel]
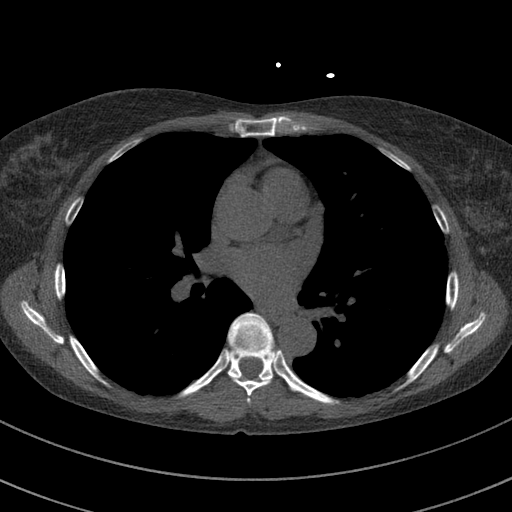
[im 56/68  vessel]
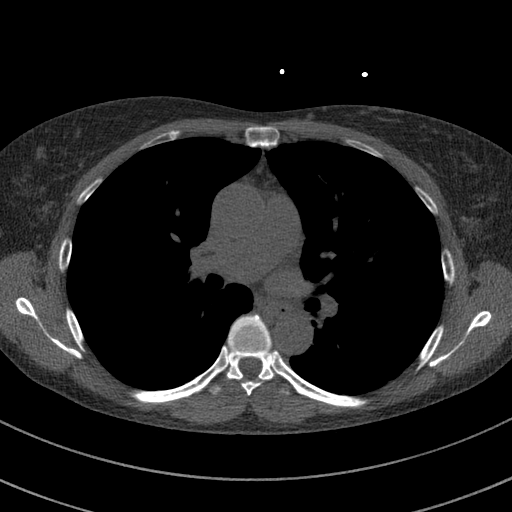
[im 56/68  lung]
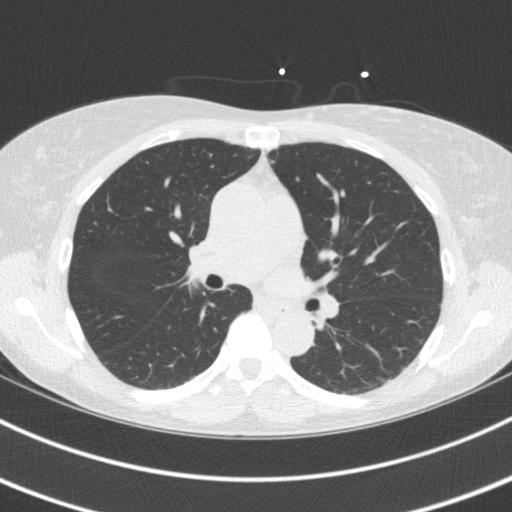

[Series 4: cascseq 2.0 br59 lung · axial · 0.64mm/px · z∈[-158,-70]mm · 5 of 68 slices shown]
[im 12/68  lung]
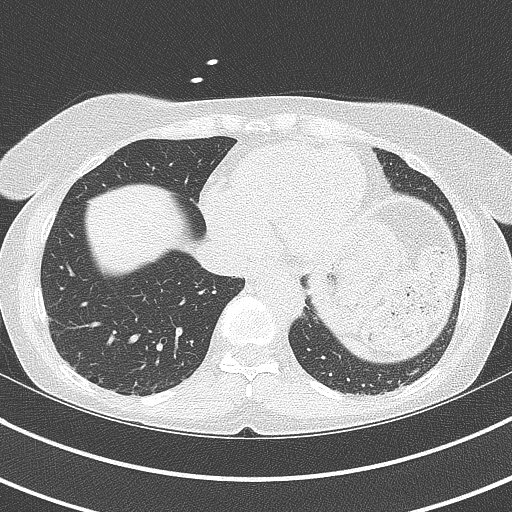
[im 23/68  lung]
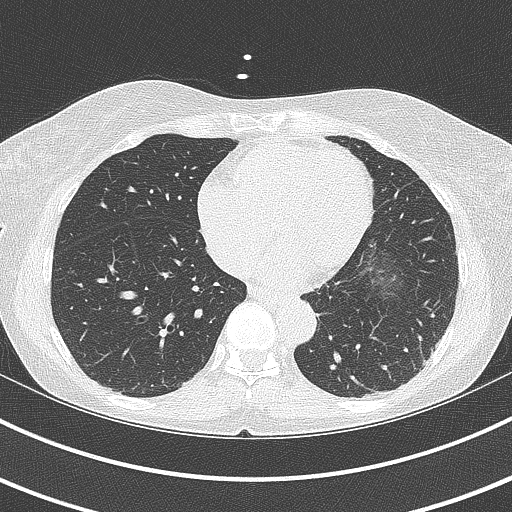
[im 34/68  lung]
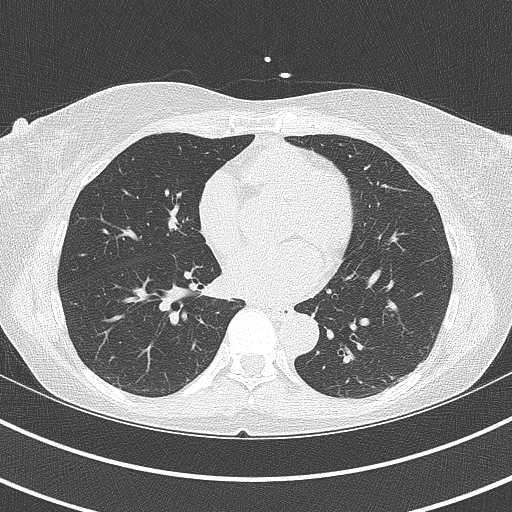
[im 45/68  lung]
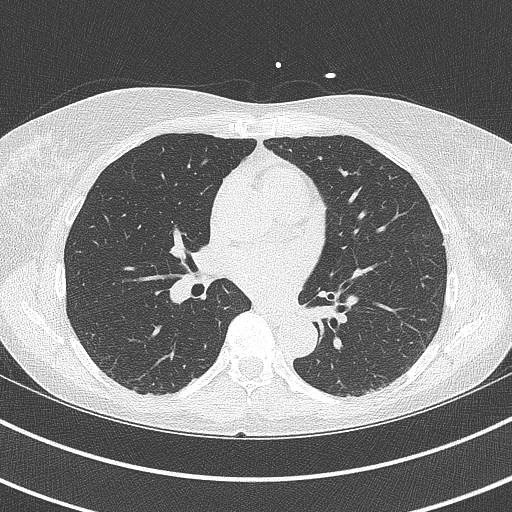
[im 56/68  lung]
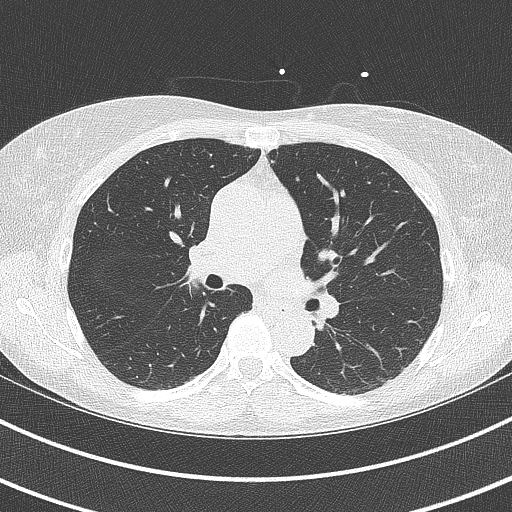

[14 of 20 positions shown; findings below may reference images not displayed]

FINDINGS: Within the visualized portions of the thorax there are no suspicious
appearing pulmonary nodules or masses, there is no acute
consolidative airspace disease, no pleural effusions, no
pneumothorax and no lymphadenopathy. Visualized portions of the
upper abdomen are unremarkable. There are no aggressive appearing
lytic or blastic lesions noted in the visualized portions of the
skeleton.
IMPRESSION: 1. No significant incidental noncardiac findings are noted.
FINDINGS: Coronary arteries: Normal origins.

Coronary Calcium Score:

Left main: 0

Left anterior descending artery: 0

Left circumflex artery: 0

Right coronary artery: 0

Total: 0

Percentile: 0

Pericardium: Normal.

Aorta: Normal caliber of ascending aorta. No aortic atherosclerosis
noted.

Non-cardiac: See separate report from [REDACTED].
IMPRESSION: Coronary calcium score of 0. This was 0 percentile for age-, race-,
and sex-matched controls.



If CAC=0, it is reasonable to withhold statin therapy and reassess
in 5 to 10 years, as long as higher risk conditions are absent
(diabetes mellitus, family history of premature CHD in first degree
relatives (males <55 years; females <65 years), cigarette smoking,
or LDL >=190 mg/dL).

If CAC is 1 to 99, it is reasonable to initiate statin therapy for
patients >=55 years of age.

If CAC is >=100 or >=75th percentile, it is reasonable to initiate
statin therapy at any age.

Cardiology referral should be considered for patients with CAC
scores >=400 or >=75th percentile.

*4423 AHA/ACC/AACVPR/AAPA/ABC/TZENG/MOOLMAN/MIKAL/Basurto/ELSTON/GI/TIGER
Guideline on the Management of Blood Cholesterol: A Report of the
American College of Cardiology/American Heart Association Task Force
on Clinical Practice Guidelines. J Am Coll Cardiol.
6277;73(24):7070-7974.

*** End of Addendum ***
EXAM:
OVER-READ INTERPRETATION  CT CHEST

The following report is an over-read performed by radiologist Dr.
Nomasibulele Moatshe [REDACTED] on 09/14/2021. This
over-read does not include interpretation of cardiac or coronary
anatomy or pathology. The coronary calcium score interpretation by
the cardiologist is attached.
FINDINGS: Within the visualized portions of the thorax there are no suspicious
appearing pulmonary nodules or masses, there is no acute
consolidative airspace disease, no pleural effusions, no
pneumothorax and no lymphadenopathy. Visualized portions of the
upper abdomen are unremarkable. There are no aggressive appearing
lytic or blastic lesions noted in the visualized portions of the
skeleton.
IMPRESSION: 1. No significant incidental noncardiac findings are noted.

## 2022-12-08 ENCOUNTER — Ambulatory Visit (INDEPENDENT_AMBULATORY_CARE_PROVIDER_SITE_OTHER): Payer: BC Managed Care – PPO | Admitting: Cardiovascular Disease

## 2022-12-08 ENCOUNTER — Encounter (HOSPITAL_BASED_OUTPATIENT_CLINIC_OR_DEPARTMENT_OTHER): Payer: Self-pay | Admitting: Cardiovascular Disease

## 2022-12-08 VITALS — BP 128/82 | HR 95 | Ht 62.0 in | Wt 141.6 lb

## 2022-12-08 DIAGNOSIS — E7800 Pure hypercholesterolemia, unspecified: Secondary | ICD-10-CM

## 2022-12-08 DIAGNOSIS — I10 Essential (primary) hypertension: Secondary | ICD-10-CM

## 2022-12-08 HISTORY — DX: Pure hypercholesterolemia, unspecified: E78.00

## 2022-12-08 NOTE — Patient Instructions (Signed)
Medication Instructions:  Your physician recommends that you continue on your current medications as directed. Please refer to the Current Medication list given to you today.   *If you need a refill on your cardiac medications before your next appointment, please call your pharmacy*  Lab Work: NONE  Testing/Procedures: NONE  Follow-Up: At [facility], you and your health needs are our priority.  As part of our continuing mission to provide you with exceptional heart care, we have created designated Provider Care Teams.  These Care Teams include your primary Cardiologist (physician) and Advanced Practice Providers (APPs -  Physician Assistants and Nurse Practitioners) who all work together to provide you with the care you need, when you need it.  We recommend signing up for the patient portal called "MyChart".  Sign up information is provided on this After Visit Summary.  MyChart is used to connect with patients for Virtual Visits (Telemedicine).  Patients are able to view lab/test results, encounter notes, upcoming appointments, etc.  Non-urgent messages can be sent to your provider as well.   To learn more about what you can do with MyChart, go to [email or web address].    Your next appointment:   2 year(s)  The format for your next appointment:   In Person  Provider:   Skeet Latch, MD

## 2022-12-08 NOTE — Assessment & Plan Note (Signed)
Blood pressure has been well controlled on valsartan.  She switched from HCTZ because it was photosensitizing and affected her other medications.  Labs were stable when recently checked with her PCP.  She is doing a great job of exercising and working to maintain a healthy weight.  No changes recommended at this time.

## 2022-12-08 NOTE — Progress Notes (Signed)
Cardiology office note   Date:  12/08/2022   ID:  Jennifer Sims, DOB [birth year], MRN [id number]  PCP:  Idelle Crouch, MD  Cardiologist:  None  Nephrologist:  Referring MD: Idelle Crouch, MD   CC: Hypertension  History of Present Illness:    Jennifer Sims is a 56 y.o. female with a hx of hypertension, Crohn's disease, and ankylosing spondylitis, here for follow-up.  She first established care in the [facility] 07/2021. She was first dx with hypertension 04/2021. She was started on bisoprolol and HCTZ. She was Dr. Garwin Brothers 05/12/2021 and her blood pressure was 109/79. She was referred to advanced hypertension clinic to discuss whether she could stop the medicine and to consider a calcium score.  She noted that her blood pressure was first elevated during her pregnancy in 2006 (no preeclampsia). However, she was not on medication until around 2020.  It was recommended that she switch to bisoprolol/HCTZ to HCTZ alone given her low heart rates.  We also encouraged her to keep working on lifestyle modification.  She was referred for calcium score 08/2021 that was 0.  She started a new medication and HCTZ was discontinued and she was switched to valsartan.  She followed up with our pharmacist 05/2022 and her blood pressure was 112/83.  No changes were made at that time.  [name] reports that she is trying to get her eating under control and is interested in [place], but her A1c is not high enough. She exercises five days a week, including working out with a trainer three times a week and running on her treadmill on the other days. She denies chest pain, breathing difficulties, and swelling in her legs or feet. She mentions a recent surgery on her right foot with some residual swelling.  The patient states that her blood pressure has been good, but she has not been checking it frequently. When she does check it, it is usually around 122/78. She struggles  with sweets and carbs in her diet and is attempting to incorporate healthier options such as protein shakes and yogurt with granola. Her main concern is her weight, with a BMI of 25.9, which is classified as overweight. She aims to reach a weight of 136 pounds or below.  [name] reports that her cholesterol was recently checked, with high HDL and slightly elevated LDL levels. However, her overall risk is low. She inquires about maintaining high HDL levels and is informed that exercise is the best method. She also mentions occasional dizziness when changing positions during yoga, which she practices three times a week for two minutes in the mornings.  Past Medical History:  Diagnosis Date   Ankylosing spondylitis ([place]) 08/24/2021   Crohn disease ([place]) 08/24/2021   Essential hypertension 08/24/2021   GERD (gastroesophageal reflux disease)    Migraine    Osteopenia    Pre-diabetes    Pure hypercholesterolemia 12/08/2022   Wears contact lenses     Past Surgical History:  Procedure Laterality Date   BUNIONECTOMY Right 05/18/2022   Procedure: BUNIONECTOMY - [place] ([place]CHELL);  Surgeon: [name], DPM;  Location: [place];  Service: Podiatry;  Laterality: Right;   CHOLECYSTECTOMY  2009    Current Medications: Current Meds  Medication Sig   Adalimumab (HUMIRA Rodman) Inject into the skin. 1 shot every 1 week.   Boswellia-Glucosamine-Vit D (OSTEO BI-FLEX ONE PER DAY PO) Take by mouth.   BOTOX 100 units SOLR injection PROVIDER TO INJECT 155 UNITS  INTO THE HEAD AND NECK MUSCLES AS DIRECTED EVERY 3 MONTHS. DISCARD REMAINDER.   Cholecalciferol (VITAMIN D3) 1000 units CAPS Take 1 capsule by mouth daily.   docusate sodium (COLACE) 100 MG capsule Take 1 capsule by mouth daily.   finasteride (PROSCAR) 5 MG tablet Take 5 mg by mouth daily.   HYDROQUINONE EX Apply topically. Apply once daily  -12% cream   ketoconazole (NIZORAL) 2 % shampoo Apply 1 application topically 2 (two)  times a week.   levocetirizine (XYZAL) 5 MG tablet Take 5 mg by mouth every evening.   magnesium oxide (MAG-OX) 400 MG tablet Take 1 tablet by mouth daily.   Methylcellulose, Laxative, (CITRUCEL PO) Take by mouth.   Multiple Vitamins-Minerals (MULTIVITAMIN ADULTS PO) Take 1 tablet by mouth daily.   NON FORMULARY Nutrafol - biotin and others for hair loss/stimulate growth   Polypodium Leucotomos (HELIOCARE) 240 MG CAPS Take by mouth.   Probiotic Product (PROBIOTIC DAILY PO) Take by mouth daily.   Rimegepant Sulfate (NURTEC) 75 MG TBDP Take 75 mg by mouth daily as needed. For migraines. Take as close to onset of migraine as possible. One daily maximum.   tranexamic acid (LYSTEDA) 650 MG TABS tablet Take 325 mg by mouth 2 (two) times daily.   Tretinoin-Cleanser-Moisturizer 0.025 % CREAM KIT Apply topically daily.   Upadacitinib ER (RINVOQ) 15 MG TB24 daily.   valsartan (DIOVAN) 80 MG tablet TAKE 1 TABLET (80 MG TOTAL) BY MOUTH DAILY. NEED APPOINTMENT   vitamin B-12 (CYANOCOBALAMIN) 1000 MCG tablet Take 1,000 mcg by mouth daily.     Allergies:   Elemental sulfur   Social History   Socioeconomic History   Marital status: Married    Spouse name: Medical laboratory scientific officer   Number of children: 2   Years of education: PHD JD   Highest education level: Not on file  Occupational History   Occupation: University   Tobacco Use   Smoking status: Never   Smokeless tobacco: Never  Vaping Use   Vaping Use: Never used  Substance and Sexual Activity   Alcohol use: Yes    Comment: 1 drink per month   Drug use: No   Sexual activity: Not on file  Other Topics Concern   Not on file  Social History Narrative   Lives with husband and children   Caffeine use: 1 cup tea per day   Social Determinants of Health   Financial Resource Strain: Low Risk  (08/24/2021)   Overall Financial Resource Strain (CARDIA)    Difficulty of Paying Living Expenses: Not hard at all  Food Insecurity: No Food Insecurity (08/24/2021)    Hunger Vital Sign    Worried About Running Out of Food in the Last Year: Never true    [place] in the Last Year: Never true  Transportation Needs: No Transportation Needs (08/24/2021)   PRAPARE - Hydrologist (Medical): No    Lack of Transportation (Non-Medical): No  Physical Activity: Sufficiently Active (08/24/2021)   Exercise Vital Sign    Days of Exercise per Week: 6 days    Minutes of Exercise per Session: 40 min  Stress: Not on file  Social Connections: Not on file     Family History: The patient's family history includes Asthma in her father; Diabetes Mellitus II in her mother; Heart failure in her father and maternal grandmother; Hypertension in her brother and mother; Osteoporosis in her maternal grandmother.  ROS:   Please see the history of present  illness.    (+) Fatigue (+) Migraines (+) Snores (+) Thinning hair All other systems reviewed and are negative.  EKGs/Labs/Other Studies Reviewed:    No prior CV studies available.  EKG:   08/24/2021: Sinus rhythm. Rate 70 bpm. 12/08/2022: Sinus rhythm.  Rate 95 bpm  Recent Labs: No results found for requested labs within last 365 days.   Recent Lipid Panel No results found for: "CHOL", "TRIG", "HDL", "CHOLHDL", "VLDL", "LDLCALC", "LDLDIRECT"  Physical Exam:   VS:  BP 128/82 (BP Location: Left Arm, Patient Position: Sitting, Cuff Size: Normal)   Pulse 95   Ht _0  (1.575 m)   Wt 141 lb 9.6 oz (64.2 kg)   BMI 25.90 kg/m  , BMI Body mass index is 25.9 kg/m. GENERAL:  Well appearing HEENT: Pupils equal round and reactive, fundi not visualized, oral mucosa unremarkable NECK:  No jugular venous distention, waveform within normal limits, carotid upstroke brisk and symmetric, no bruits LUNGS:  Clear to auscultation bilaterally HEART:  RRR.  PMI not displaced or sustained,S1 and S2 within normal limits, no S3, no S4, no clicks, no rubs, no murmurs ABD:  Flat, positive bowel sounds  normal in frequency in pitch, no bruits, no rebound, no guarding, no midline pulsatile mass, no hepatomegaly, no splenomegaly EXT:  2 plus pulses throughout, no edema, no cyanosis no clubbing SKIN:  No rashes no nodules NEURO:  Cranial nerves II through XII grossly intact, motor grossly intact throughout PSYCH:  Cognitively intact, oriented to person place and time   ASSESSMENT/PLAN:    Essential hypertension Blood pressure has been well controlled on valsartan.  She switched from HCTZ because it was photosensitizing and affected her other medications.  Labs were stable when recently checked with her PCP.  She is doing a great job of exercising and working to maintain a healthy weight.  No changes recommended at this time.  Pure hypercholesterolemia LDL is slightly elevated.  However her HDL is very high and her overall cardiovascular risk is low.  She is exercising regularly and trying to continue working on her diet.  Coronary calcium score was 0 in 2022.  Plan to repeat in 2027.  No plans for medication at this time.  Disposition:    FU with Shaquanda Graves C. Oval Linsey, MD, [facility] in 1 month.     Medication Adjustments/Labs and Tests Ordered: Current medicines are reviewed at length with the patient today.  Concerns regarding medicines are outlined above.  Orders Placed This Encounter  Procedures   EKG 12-Lead    No orders of the defined types were placed in this encounter.    Signed, Skeet Latch, MD  12/08/2022 11:03 AM    [place]

## 2022-12-08 NOTE — Assessment & Plan Note (Signed)
LDL is slightly elevated.  However her HDL is very high and her overall cardiovascular risk is low.  She is exercising regularly and trying to continue working on her diet.  Coronary calcium score was 0 in 2022.  Plan to repeat in 2027.  No plans for medication at this time.

## 2022-12-25 ENCOUNTER — Other Ambulatory Visit (HOSPITAL_BASED_OUTPATIENT_CLINIC_OR_DEPARTMENT_OTHER): Payer: Self-pay | Admitting: Cardiovascular Disease

## 2022-12-27 NOTE — Telephone Encounter (Signed)
Rx request sent to pharmacy.  

## 2023-01-11 ENCOUNTER — Telehealth: Payer: Self-pay | Admitting: Neurology

## 2023-01-11 NOTE — Telephone Encounter (Signed)
Called [facility] to schedule Botox delivery, they have to complete benefits verification and obtain pt's consent before they can ship. They state this can take up to 48 hours and they will give [place] a call to schedule once this has been completed.

## 2023-01-12 ENCOUNTER — Telehealth: Payer: Self-pay | Admitting: Neurology

## 2023-01-12 NOTE — Telephone Encounter (Signed)
BOTOX Delivery  01/12/23 Units 100  Single dose [place] No Signature required

## 2023-01-17 ENCOUNTER — Ambulatory Visit: Payer: BC Managed Care – PPO | Admitting: Neurology

## 2023-01-17 DIAGNOSIS — G43711 Chronic migraine without aura, intractable, with status migrainosus: Secondary | ICD-10-CM

## 2023-01-17 MED ORDER — ONABOTULINUMTOXINA 100 UNITS IJ SOLR
155.0000 [IU] | Freq: Once | INTRAMUSCULAR | Status: AC
  Administered 2023-01-17: 155 [IU] via INTRAMUSCULAR

## 2023-01-17 NOTE — Progress Notes (Unsigned)
Botox- 100 units x 2 vials Lot: [id number] Expiration: 02/2025 NDC: 7867-5449-20  Bacteriostatic 0.9% Sodium Chloride- 1mL total Lot: [id number] Expiration: 11/25 NDC: 97588-325-49  Dx: I26.415 S/P

## 2023-01-17 NOTE — Progress Notes (Unsigned)
Consent Form Botulism Toxin Injection For Chronic Migraine 01/17/2023: stable, doing great 10/21/2023: stable 07/26/2022: stable still doing great >>95% improvement 04/27/2022: Stable still doing great 02/01/2022; stable oding great 11/08/2021: >95% improvement in frequency and severity of migraines     Reviewed orally with patient, additionally signature is on file:  Botulism toxin has been approved by the Federal drug administration for treatment of chronic migraine. Botulism toxin does not cure chronic migraine and it may not be effective in some patients.  The administration of botulism toxin is accomplished by injecting a small amount of toxin into the muscles of the neck and head. Dosage must be titrated for each individual. Any benefits resulting from botulism toxin tend to wear off after 3 months with a repeat injection required if benefit is to be maintained. Injections are usually done every 3-4 months with maximum effect peak achieved by about 2 or 3 weeks. Botulism toxin is expensive and you should be sure of what costs you will incur resulting from the injection.  The side effects of botulism toxin use for chronic migraine may include:   -Transient, and usually mild, facial weakness with facial injections  -Transient, and usually mild, head or neck weakness with head/neck injections  -Reduction or loss of forehead facial animation due to forehead muscle weakness  -Eyelid drooping  -Dry eye  -Pain at the site of injection or bruising at the site of injection  -Double vision  -Potential unknown long term risks  Contraindications: You should not have Botox if you are pregnant, nursing, allergic to albumin, have an infection, skin condition, or muscle weakness at the site of the injection, or have myasthenia gravis, Lambert-Eaton syndrome, or ALS.  It is also possible that as with any injection, there may be an allergic reaction or no effect from the medication. Reduced  effectiveness after repeated injections is sometimes seen and rarely infection at the injection site may occur. All care will be taken to prevent these side effects. If therapy is given over a long time, atrophy and wasting in the muscle injected may occur. Occasionally the patient's become refractory to treatment because they develop antibodies to the toxin. In this event, therapy needs to be modified.  I have read the above information and consent to the administration of botulism toxin.    BOTOX PROCEDURE NOTE FOR MIGRAINE HEADACHE    Contraindications and precautions discussed with patient(above). Aseptic procedure was observed and patient tolerated procedure. Procedure performed by Dr. Georgia Dom  The condition has existed for more than 6 months, and pt does not have a diagnosis of ALS, Myasthenia Gravis or Lambert-Eaton Syndrome.  Risks and benefits of injections discussed and pt agrees to proceed with the procedure.  Written consent obtained  These injections are medically necessary. Pt  receives good benefits from these injections. These injections do not cause sedations or hallucinations which the oral therapies may cause.  Description of procedure:  The patient was placed in a sitting position. The standard protocol was used for Botox as follows, with 5 units of Botox injected at each site:   -Procerus muscle, midline injection  -Corrugator muscle, bilateral injection  -Frontalis muscle, bilateral injection, with 2 sites each side, medial injection was performed in the upper one third of the frontalis muscle, in the region vertical from the medial inferior edge of the superior orbital rim. The lateral injection was again in the upper one third of the forehead vertically above the lateral limbus of the  cornea, 1.5 cm lateral to the medial injection site.  -Temporalis muscle injection, 4 sites, bilaterally. The first injection was 3 cm above the tragus of the ear, second injection  site was 1.5 cm to 3 cm up from the first injection site in line with the tragus of the ear. The third injection site was 1.5-3 cm forward between the first 2 injection sites. The fourth injection site was 1.5 cm posterior to the second injection site.    -Occipitalis muscle injection, 3 sites, bilaterally. The first injection was done one half way between the occipital protuberance and the tip of the mastoid process behind the ear. The second injection site was done lateral and superior to the first, 1 fingerbreadth from the first injection. The third injection site was 1 fingerbreadth superiorly and medially from the first injection site.  -Cervical paraspinal muscle injection, 2 sites, bilateral knee first injection site was 1 cm from the midline of the cervical spine, 3 cm inferior to the lower border of the occipital protuberance. The second injection site was 1.5 cm superiorly and laterally to the first injection site.  -Trapezius muscle injection was performed at 3 sites, bilaterally. The first injection site was in the upper trapezius muscle halfway between the inflection point of the neck, and the acromion. The second injection site was one half way between the acromion and the first injection site. The third injection was done between the first injection site and the inflection point of the neck.   Will return for repeat injection in 3 months.   A 155 unit sof Botox was used, 45u Botox not injected was wasted. The patient tolerated the procedure well, there were no complications of the above procedure.

## 2023-02-21 ENCOUNTER — Other Ambulatory Visit: Payer: Self-pay | Admitting: Neurology

## 2023-02-21 DIAGNOSIS — G43009 Migraine without aura, not intractable, without status migrainosus: Secondary | ICD-10-CM

## 2023-03-22 ENCOUNTER — Telehealth: Payer: Self-pay | Admitting: Neurology

## 2023-03-22 ENCOUNTER — Other Ambulatory Visit (HOSPITAL_COMMUNITY): Payer: Self-pay

## 2023-03-22 NOTE — Telephone Encounter (Signed)
  Benefit Verification [id number] Submitted!

## 2023-03-22 NOTE — Telephone Encounter (Signed)
New Botox auth needed before 04/11/23 appt, current auth expired 04/05/23.

## 2023-03-24 ENCOUNTER — Other Ambulatory Visit (HOSPITAL_COMMUNITY): Payer: Self-pay

## 2023-03-24 NOTE — Telephone Encounter (Signed)
Pharmacy Patient Advocate Encounter   Received notification from [facility] that prior authorization for Botox 100UNIT solution is required/requested.   PA submitted on 03/24/2023 to (ins) [employer] via [employer] or [facility]) confirmation # [id number] Status is pending

## 2023-03-24 NOTE — Telephone Encounter (Signed)
Filled out [facility] PA continuation Form and faxed along with clinicals tto [phone].

## 2023-03-28 ENCOUNTER — Encounter: Payer: Self-pay | Admitting: *Deleted

## 2023-03-28 ENCOUNTER — Telehealth: Payer: Self-pay | Admitting: *Deleted

## 2023-03-28 NOTE — Telephone Encounter (Signed)
Checking to see if they've gotten back to you yet, thank you.

## 2023-03-28 NOTE — Telephone Encounter (Signed)
Received notification that PA is needed for Nurtec.

## 2023-03-29 ENCOUNTER — Other Ambulatory Visit (HOSPITAL_COMMUNITY): Payer: Self-pay

## 2023-03-29 NOTE — Telephone Encounter (Signed)
Pharmacy Patient Advocate Encounter  Prior Authorization for Nurtec 75MG  dispersible tablets has been approved by [employer] (ins).    PA #  PA Case ID: [id number] Effective dates: 03/29/2023 through 03/28/2024

## 2023-03-29 NOTE — Telephone Encounter (Signed)
Pharmacy Patient Advocate Encounter- Botox BIV-Medical Benefit:  J code: [id number] CPT code: [id number] Dx Code: [id number]  PA was submitted to [facility] and has been approved through: 03/07/2024 Authorization# Reference #: [id number]  Please send prescription to Specialty [facility]: [facility]: [phone]  Patient Is eligible for Botox Copay Card.

## 2023-03-29 NOTE — Telephone Encounter (Signed)
Pharmacy Patient Advocate Encounter   Received notification from [facility] that prior authorization for Nurtec 75MG  Dispersible Tablets is required/requested.  PA submitted on 03/29/2023 to (ins) [employer] via [employer] or (Medicaid) confirmation # [id number] Status is pending

## 2023-03-30 ENCOUNTER — Telehealth: Payer: Self-pay | Admitting: Neurology

## 2023-03-30 NOTE — Telephone Encounter (Signed)
Received (2) 100 unit vials of Botox

## 2023-04-11 ENCOUNTER — Ambulatory Visit: Payer: BC Managed Care – PPO | Admitting: Neurology

## 2023-04-11 DIAGNOSIS — G43711 Chronic migraine without aura, intractable, with status migrainosus: Secondary | ICD-10-CM | POA: Diagnosis not present

## 2023-04-11 MED ORDER — ONABOTULINUMTOXINA 100 UNITS IJ SOLR
155.0000 [IU] | Freq: Once | INTRAMUSCULAR | Status: AC
  Administered 2023-04-11: 155 [IU] via INTRAMUSCULAR

## 2023-04-11 NOTE — Progress Notes (Signed)
Patient signed consent   Botox- 100 units x 2 vials Lot: [id number] Expiration: 03/2025 NDC: 1191-4782-95  Bacteriostatic 0.9% Sodium Chloride- 4 mL total Lot: [id number] Expiration: 11/25 NDC: 57846-962-95  Dx: G43.711 S/P  Witnessed by April J RN

## 2023-04-11 NOTE — Progress Notes (Signed)
Consent Form Botulism Toxin Injection For Chronic Migraine    04/11/2023: stable 01/17/2023: stable, doing great 10/21/2023: stable 07/26/2022: stable still doing great >>95% improvement 04/27/2022: Stable still doing great 02/01/2022; stable oding great 11/08/2021: >95% improvement in frequency and severity of migraines     Reviewed orally with patient, additionally signature is on file:  Botulism toxin has been approved by the Federal drug administration for treatment of chronic migraine. Botulism toxin does not cure chronic migraine and it may not be effective in some patients.  The administration of botulism toxin is accomplished by injecting a small amount of toxin into the muscles of the neck and head. Dosage must be titrated for each individual. Any benefits resulting from botulism toxin tend to wear off after 3 months with a repeat injection required if benefit is to be maintained. Injections are usually done every 3-4 months with maximum effect peak achieved by about 2 or 3 weeks. Botulism toxin is expensive and you should be sure of what costs you will incur resulting from the injection.  The side effects of botulism toxin use for chronic migraine may include:   -Transient, and usually mild, facial weakness with facial injections  -Transient, and usually mild, head or neck weakness with head/neck injections  -Reduction or loss of forehead facial animation due to forehead muscle weakness  -Eyelid drooping  -Dry eye  -Pain at the site of injection or bruising at the site of injection  -Double vision  -Potential unknown long term risks  Contraindications: You should not have Botox if you are pregnant, nursing, allergic to albumin, have an infection, skin condition, or muscle weakness at the site of the injection, or have myasthenia gravis, Lambert-Eaton syndrome, or ALS.  It is also possible that as with any injection, there may be an allergic reaction or no effect from  the medication. Reduced effectiveness after repeated injections is sometimes seen and rarely infection at the injection site may occur. All care will be taken to prevent these side effects. If therapy is given over a long time, atrophy and wasting in the muscle injected may occur. Occasionally the patient's become refractory to treatment because they develop antibodies to the toxin. In this event, therapy needs to be modified.  I have read the above information and consent to the administration of botulism toxin.    BOTOX PROCEDURE NOTE FOR MIGRAINE HEADACHE    Contraindications and precautions discussed with patient(above). Aseptic procedure was observed and patient tolerated procedure. Procedure performed by Dr. Artemio Aly  The condition has existed for more than 6 months, and pt does not have a diagnosis of ALS, Myasthenia Gravis or Lambert-Eaton Syndrome.  Risks and benefits of injections discussed and pt agrees to proceed with the procedure.  Written consent obtained  These injections are medically necessary. Pt  receives good benefits from these injections. These injections do not cause sedations or hallucinations which the oral therapies may cause.  Description of procedure:  The patient was placed in a sitting position. The standard protocol was used for Botox as follows, with 5 units of Botox injected at each site:   -Procerus muscle, midline injection  -Corrugator muscle, bilateral injection  -Frontalis muscle, bilateral injection, with 2 sites each side, medial injection was performed in the upper one third of the frontalis muscle, in the region vertical from the medial inferior edge of the superior orbital rim. The lateral injection was again in the upper one third of the forehead vertically above  the lateral limbus of the cornea, 1.5 cm lateral to the medial injection site.  -Temporalis muscle injection, 4 sites, bilaterally. The first injection was 3 cm above the tragus of  the ear, second injection site was 1.5 cm to 3 cm up from the first injection site in line with the tragus of the ear. The third injection site was 1.5-3 cm forward between the first 2 injection sites. The fourth injection site was 1.5 cm posterior to the second injection site.    -Occipitalis muscle injection, 3 sites, bilaterally. The first injection was done one half way between the occipital protuberance and the tip of the mastoid process behind the ear. The second injection site was done lateral and superior to the first, 1 fingerbreadth from the first injection. The third injection site was 1 fingerbreadth superiorly and medially from the first injection site.  -Cervical paraspinal muscle injection, 2 sites, bilateral knee first injection site was 1 cm from the midline of the cervical spine, 3 cm inferior to the lower border of the occipital protuberance. The second injection site was 1.5 cm superiorly and laterally to the first injection site.  -Trapezius muscle injection was performed at 3 sites, bilaterally. The first injection site was in the upper trapezius muscle halfway between the inflection point of the neck, and the acromion. The second injection site was one half way between the acromion and the first injection site. The third injection was done between the first injection site and the inflection point of the neck.   Will return for repeat injection in 3 months.   A 155 unit sof Botox was used, 45u Botox not injected was wasted. The patient tolerated the procedure well, there were no complications of the above procedure.

## 2023-05-07 ENCOUNTER — Other Ambulatory Visit (HOSPITAL_BASED_OUTPATIENT_CLINIC_OR_DEPARTMENT_OTHER): Payer: Self-pay | Admitting: Cardiovascular Disease

## 2023-05-08 NOTE — Telephone Encounter (Signed)
Rx request sent to pharmacy.  

## 2023-07-04 ENCOUNTER — Ambulatory Visit: Payer: BC Managed Care – PPO | Admitting: Neurology

## 2023-07-04 DIAGNOSIS — G43711 Chronic migraine without aura, intractable, with status migrainosus: Secondary | ICD-10-CM

## 2023-07-04 DIAGNOSIS — G43009 Migraine without aura, not intractable, without status migrainosus: Secondary | ICD-10-CM

## 2023-07-04 MED ORDER — ONABOTULINUMTOXINA 100 UNITS IJ SOLR
155.0000 [IU] | Freq: Once | INTRAMUSCULAR | Status: AC
Start: 2023-07-04 — End: 2023-07-04
  Administered 2023-07-04: 155 [IU] via INTRAMUSCULAR

## 2023-07-04 NOTE — Progress Notes (Signed)
Botox- 100 units x 2 vial Lot: [id number] Expiration: 04/2025 NDC: 0865-7846-96   Bacteriostatic 0.9% Sodium Chloride   LOT#: [id number] EXP: 10/26/2024 NDC: 1324-4010-27    Dx G43.711 SP   Witnessed by Leeann Must

## 2023-07-04 NOTE — Progress Notes (Signed)
Consent Form Botulism Toxin Injection For Chronic Migraine   07/04/2023 still doing great, stable 04/11/2023: stable 01/17/2023: stable, doing great 10/21/2023: stable 07/26/2022: stable still doing great >>95% improvement 04/27/2022: Stable still doing great 02/01/2022; stable oding great 11/08/2021: >95% improvement in frequency and severity of migraines     Reviewed orally with patient, additionally signature is on file:  Botulism toxin has been approved by the Federal drug administration for treatment of chronic migraine. Botulism toxin does not cure chronic migraine and it may not be effective in some patients.  The administration of botulism toxin is accomplished by injecting a small amount of toxin into the muscles of the neck and head. Dosage must be titrated for each individual. Any benefits resulting from botulism toxin tend to wear off after 3 months with a repeat injection required if benefit is to be maintained. Injections are usually done every 3-4 months with maximum effect peak achieved by about 2 or 3 weeks. Botulism toxin is expensive and you should be sure of what costs you will incur resulting from the injection.  The side effects of botulism toxin use for chronic migraine may include:   -Transient, and usually mild, facial weakness with facial injections  -Transient, and usually mild, head or neck weakness with head/neck injections  -Reduction or loss of forehead facial animation due to forehead muscle weakness  -Eyelid drooping  -Dry eye  -Pain at the site of injection or bruising at the site of injection  -Double vision  -Potential unknown long term risks  Contraindications: You should not have Botox if you are pregnant, nursing, allergic to albumin, have an infection, skin condition, or muscle weakness at the site of the injection, or have myasthenia gravis, Lambert-Eaton syndrome, or ALS.  It is also possible that as with any injection, there may be an  allergic reaction or no effect from the medication. Reduced effectiveness after repeated injections is sometimes seen and rarely infection at the injection site may occur. All care will be taken to prevent these side effects. If therapy is given over a long time, atrophy and wasting in the muscle injected may occur. Occasionally the patient's become refractory to treatment because they develop antibodies to the toxin. In this event, therapy needs to be modified.  I have read the above information and consent to the administration of botulism toxin.    BOTOX PROCEDURE NOTE FOR MIGRAINE HEADACHE    Contraindications and precautions discussed with patient(above). Aseptic procedure was observed and patient tolerated procedure. Procedure performed by Dr. Artemio Aly  The condition has existed for more than 6 months, and pt does not have a diagnosis of ALS, Myasthenia Gravis or Lambert-Eaton Syndrome.  Risks and benefits of injections discussed and pt agrees to proceed with the procedure.  Written consent obtained  These injections are medically necessary. Pt  receives good benefits from these injections. These injections do not cause sedations or hallucinations which the oral therapies may cause.  Description of procedure:  The patient was placed in a sitting position. The standard protocol was used for Botox as follows, with 5 units of Botox injected at each site:   -Procerus muscle, midline injection  -Corrugator muscle, bilateral injection  -Frontalis muscle, bilateral injection, with 2 sites each side, medial injection was performed in the upper one third of the frontalis muscle, in the region vertical from the medial inferior edge of the superior orbital rim. The lateral injection was again in the upper one third of  the forehead vertically above the lateral limbus of the cornea, 1.5 cm lateral to the medial injection site.  -Temporalis muscle injection, 4 sites, bilaterally. The first  injection was 3 cm above the tragus of the ear, second injection site was 1.5 cm to 3 cm up from the first injection site in line with the tragus of the ear. The third injection site was 1.5-3 cm forward between the first 2 injection sites. The fourth injection site was 1.5 cm posterior to the second injection site.    -Occipitalis muscle injection, 3 sites, bilaterally. The first injection was done one half way between the occipital protuberance and the tip of the mastoid process behind the ear. The second injection site was done lateral and superior to the first, 1 fingerbreadth from the first injection. The third injection site was 1 fingerbreadth superiorly and medially from the first injection site.  -Cervical paraspinal muscle injection, 2 sites, bilateral knee first injection site was 1 cm from the midline of the cervical spine, 3 cm inferior to the lower border of the occipital protuberance. The second injection site was 1.5 cm superiorly and laterally to the first injection site.  -Trapezius muscle injection was performed at 3 sites, bilaterally. The first injection site was in the upper trapezius muscle halfway between the inflection point of the neck, and the acromion. The second injection site was one half way between the acromion and the first injection site. The third injection was done between the first injection site and the inflection point of the neck.   Will return for repeat injection in 3 months.   A 155 unit sof Botox was used, 45u Botox not injected was wasted. The patient tolerated the procedure well, there were no complications of the above procedure.

## 2023-09-05 ENCOUNTER — Other Ambulatory Visit: Payer: Self-pay | Admitting: *Deleted

## 2023-09-05 DIAGNOSIS — G43009 Migraine without aura, not intractable, without status migrainosus: Secondary | ICD-10-CM

## 2023-09-05 MED ORDER — BOTOX 100 UNITS IJ SOLR
INTRAMUSCULAR | 3 refills | Status: DC
Start: 2023-09-05 — End: 2023-09-14

## 2023-09-14 ENCOUNTER — Other Ambulatory Visit: Payer: Self-pay | Admitting: *Deleted

## 2023-09-14 ENCOUNTER — Telehealth: Payer: Self-pay | Admitting: Neurology

## 2023-09-14 DIAGNOSIS — G43009 Migraine without aura, not intractable, without status migrainosus: Secondary | ICD-10-CM

## 2023-09-14 MED ORDER — BOTOX 100 UNITS IJ SOLR
INTRAMUSCULAR | 3 refills | Status: DC
Start: 2023-09-14 — End: 2024-08-13

## 2023-09-14 NOTE — Telephone Encounter (Signed)
[email or web address] called to confirm delivery details for Botox, Delivery Details:100 units, Single Dose Vial quantity 2 90 day supply delivery date 9-24 [facility] office address verified along with office hours.  This is FYI for POD 4

## 2023-09-14 NOTE — Telephone Encounter (Signed)
done

## 2023-09-14 NOTE — Telephone Encounter (Signed)
Can you please send rx to [facility] [place] location? Thank you.

## 2023-09-26 ENCOUNTER — Ambulatory Visit: Payer: BC Managed Care – PPO | Admitting: Neurology

## 2023-09-26 DIAGNOSIS — G43711 Chronic migraine without aura, intractable, with status migrainosus: Secondary | ICD-10-CM

## 2023-09-26 MED ORDER — ONABOTULINUMTOXINA 100 UNITS IJ SOLR
155.0000 [IU] | Freq: Once | INTRAMUSCULAR | Status: AC
Start: 2023-09-26 — End: 2023-09-26
  Administered 2023-09-26: 155 [IU] via INTRAMUSCULAR

## 2023-09-26 NOTE — Progress Notes (Signed)
Botox- 200 units x 1 vial Lot: [id number] Expiration: 09/2025 NDC: 1478-2956-21  Bacteriostatic 0.9% Sodium Chloride- 4 mL  Lot: [id number] Expiration: 03/26/2024 NDC: 8469-6295-28  Dx: U13.244 S/P  Witnessed by Alveria Apley CMA

## 2023-09-26 NOTE — Progress Notes (Signed)
Consent Form Botulism Toxin Injection For Chronic Migraine  09/26/2023: stable still doing great >>95% improvement 07/04/2023 still doing great, stable 04/11/2023: stable 01/17/2023: stable, doing great 10/21/2023: stable 07/26/2022: stable still doing great >>95% improvement 04/27/2022: Stable still doing great 02/01/2022; stable oding great 11/08/2021: >95% improvement in frequency and severity of migraines     Reviewed orally with patient, additionally signature is on file:  Botulism toxin has been approved by the Federal drug administration for treatment of chronic migraine. Botulism toxin does not cure chronic migraine and it may not be effective in some patients.  The administration of botulism toxin is accomplished by injecting a small amount of toxin into the muscles of the neck and head. Dosage must be titrated for each individual. Any benefits resulting from botulism toxin tend to wear off after 3 months with a repeat injection required if benefit is to be maintained. Injections are usually done every 3-4 months with maximum effect peak achieved by about 2 or 3 weeks. Botulism toxin is expensive and you should be sure of what costs you will incur resulting from the injection.  The side effects of botulism toxin use for chronic migraine may include:   -Transient, and usually mild, facial weakness with facial injections  -Transient, and usually mild, head or neck weakness with head/neck injections  -Reduction or loss of forehead facial animation due to forehead muscle weakness  -Eyelid drooping  -Dry eye  -Pain at the site of injection or bruising at the site of injection  -Double vision  -Potential unknown long term risks  Contraindications: You should not have Botox if you are pregnant, nursing, allergic to albumin, have an infection, skin condition, or muscle weakness at the site of the injection, or have myasthenia gravis, Lambert-Eaton syndrome, or ALS.  It is also  possible that as with any injection, there may be an allergic reaction or no effect from the medication. Reduced effectiveness after repeated injections is sometimes seen and rarely infection at the injection site may occur. All care will be taken to prevent these side effects. If therapy is given over a long time, atrophy and wasting in the muscle injected may occur. Occasionally the patient's become refractory to treatment because they develop antibodies to the toxin. In this event, therapy needs to be modified.  I have read the above information and consent to the administration of botulism toxin.    BOTOX PROCEDURE NOTE FOR MIGRAINE HEADACHE    Contraindications and precautions discussed with patient(above). Aseptic procedure was observed and patient tolerated procedure. Procedure performed by Dr. Artemio Aly  The condition has existed for more than 6 months, and pt does not have a diagnosis of ALS, Myasthenia Gravis or Lambert-Eaton Syndrome.  Risks and benefits of injections discussed and pt agrees to proceed with the procedure.  Written consent obtained  These injections are medically necessary. Pt  receives good benefits from these injections. These injections do not cause sedations or hallucinations which the oral therapies may cause.  Description of procedure:  The patient was placed in a sitting position. The standard protocol was used for Botox as follows, with 5 units of Botox injected at each site:   -Procerus muscle, midline injection  -Corrugator muscle, bilateral injection  -Frontalis muscle, bilateral injection, with 2 sites each side, medial injection was performed in the upper one third of the frontalis muscle, in the region vertical from the medial inferior edge of the superior orbital rim. The lateral injection was again  in the upper one third of the forehead vertically above the lateral limbus of the cornea, 1.5 cm lateral to the medial injection site.  -Temporalis  muscle injection, 4 sites, bilaterally. The first injection was 3 cm above the tragus of the ear, second injection site was 1.5 cm to 3 cm up from the first injection site in line with the tragus of the ear. The third injection site was 1.5-3 cm forward between the first 2 injection sites. The fourth injection site was 1.5 cm posterior to the second injection site.    -Occipitalis muscle injection, 3 sites, bilaterally. The first injection was done one half way between the occipital protuberance and the tip of the mastoid process behind the ear. The second injection site was done lateral and superior to the first, 1 fingerbreadth from the first injection. The third injection site was 1 fingerbreadth superiorly and medially from the first injection site.  -Cervical paraspinal muscle injection, 2 sites, bilateral knee first injection site was 1 cm from the midline of the cervical spine, 3 cm inferior to the lower border of the occipital protuberance. The second injection site was 1.5 cm superiorly and laterally to the first injection site.  -Trapezius muscle injection was performed at 3 sites, bilaterally. The first injection site was in the upper trapezius muscle halfway between the inflection point of the neck, and the acromion. The second injection site was one half way between the acromion and the first injection site. The third injection was done between the first injection site and the inflection point of the neck.   Will return for repeat injection in 3 months.   A 155 unit sof Botox was used, 45u Botox not injected was wasted. The patient tolerated the procedure well, there were no complications of the above procedure.

## 2023-10-12 DIAGNOSIS — L4050 Arthropathic psoriasis, unspecified: Secondary | ICD-10-CM | POA: Insufficient documentation

## 2023-10-12 DIAGNOSIS — L409 Psoriasis, unspecified: Secondary | ICD-10-CM | POA: Insufficient documentation

## 2023-10-12 DIAGNOSIS — K648 Other hemorrhoids: Secondary | ICD-10-CM | POA: Insufficient documentation

## 2023-11-07 ENCOUNTER — Other Ambulatory Visit: Payer: Self-pay | Admitting: Neurology

## 2023-11-07 DIAGNOSIS — G43009 Migraine without aura, not intractable, without status migrainosus: Secondary | ICD-10-CM

## 2023-12-04 ENCOUNTER — Ambulatory Visit: Payer: BC Managed Care – PPO

## 2023-12-04 DIAGNOSIS — K6389 Other specified diseases of intestine: Secondary | ICD-10-CM | POA: Diagnosis present

## 2023-12-04 DIAGNOSIS — K640 First degree hemorrhoids: Secondary | ICD-10-CM | POA: Diagnosis not present

## 2023-12-19 ENCOUNTER — Ambulatory Visit: Payer: BC Managed Care – PPO | Admitting: Neurology

## 2023-12-19 DIAGNOSIS — G43711 Chronic migraine without aura, intractable, with status migrainosus: Secondary | ICD-10-CM

## 2023-12-19 MED ORDER — ONABOTULINUMTOXINA 100 UNITS IJ SOLR
155.0000 [IU] | Freq: Once | INTRAMUSCULAR | Status: AC
  Administered 2023-12-19: 155 [IU] via INTRAMUSCULAR

## 2023-12-19 NOTE — Progress Notes (Signed)
Botox- 100 units x 2 vial Lot: [id number] Expiration: 03/2026 NDC: 8469-6295-28  Bacteriostatic 0.9% Sodium Chloride- * mL  Lot: [id number] Expiration: 03/26/2024 NDC: 0102-7253-66  Dx: Y40.347 S/P Witnessed by Truitt Leep, RN

## 2023-12-19 NOTE — Progress Notes (Addendum)
Consent Form Botulism Toxin Injection For Chronic Migraine 12/22/2023: stable. Patient gets > 50% improvement in migraine freq and severity but still having 8 migraine days a month and 15 headache days will try and layer ajovy over botox (aimovig contraindicated due to constipatio)    09/26/2023: stable still doing great >>95% improvement 07/04/2023 still doing great, stable 04/11/2023: stable 01/17/2023: stable, doing great 10/21/2023: stable 07/26/2022: stable still doing great >>95% improvement 04/27/2022: Stable still doing great 02/01/2022; stable oding great 11/08/2021: >95% improvement in frequency and severity of migraines     Reviewed orally with patient, additionally signature is on file:  Botulism toxin has been approved by the Federal drug administration for treatment of chronic migraine. Botulism toxin does not cure chronic migraine and it may not be effective in some patients.  The administration of botulism toxin is accomplished by injecting a small amount of toxin into the muscles of the neck and head. Dosage must be titrated for each individual. Any benefits resulting from botulism toxin tend to wear off after 3 months with a repeat injection required if benefit is to be maintained. Injections are usually done every 3-4 months with maximum effect peak achieved by about 2 or 3 weeks. Botulism toxin is expensive and you should be sure of what costs you will incur resulting from the injection.  The side effects of botulism toxin use for chronic migraine may include:   -Transient, and usually mild, facial weakness with facial injections  -Transient, and usually mild, head or neck weakness with head/neck injections  -Reduction or loss of forehead facial animation due to forehead muscle weakness  -Eyelid drooping  -Dry eye  -Pain at the site of injection or bruising at the site of injection  -Double vision  -Potential unknown long term risks  Contraindications: You  should not have Botox if you are pregnant, nursing, allergic to albumin, have an infection, skin condition, or muscle weakness at the site of the injection, or have myasthenia gravis, Lambert-Eaton syndrome, or ALS.  It is also possible that as with any injection, there may be an allergic reaction or no effect from the medication. Reduced effectiveness after repeated injections is sometimes seen and rarely infection at the injection site may occur. All care will be taken to prevent these side effects. If therapy is given over a long time, atrophy and wasting in the muscle injected may occur. Occasionally the patient's become refractory to treatment because they develop antibodies to the toxin. In this event, therapy needs to be modified.  I have read the above information and consent to the administration of botulism toxin.    BOTOX PROCEDURE NOTE FOR MIGRAINE HEADACHE    Contraindications and precautions discussed with patient(above). Aseptic procedure was observed and patient tolerated procedure. Procedure performed by Dr. Artemio Aly  The condition has existed for more than 6 months, and pt does not have a diagnosis of ALS, Myasthenia Gravis or Lambert-Eaton Syndrome.  Risks and benefits of injections discussed and pt agrees to proceed with the procedure.  Written consent obtained  These injections are medically necessary. Pt  receives good benefits from these injections. These injections do not cause sedations or hallucinations which the oral therapies may cause.  Description of procedure:  The patient was placed in a sitting position. The standard protocol was used for Botox as follows, with 5 units of Botox injected at each site:   -Procerus muscle, midline injection  -Corrugator muscle, bilateral injection  -Frontalis muscle, bilateral  injection, with 2 sites each side, medial injection was performed in the upper one third of the frontalis muscle, in the region vertical from the  medial inferior edge of the superior orbital rim. The lateral injection was again in the upper one third of the forehead vertically above the lateral limbus of the cornea, 1.5 cm lateral to the medial injection site.  -Temporalis muscle injection, 4 sites, bilaterally. The first injection was 3 cm above the tragus of the ear, second injection site was 1.5 cm to 3 cm up from the first injection site in line with the tragus of the ear. The third injection site was 1.5-3 cm forward between the first 2 injection sites. The fourth injection site was 1.5 cm posterior to the second injection site.    -Occipitalis muscle injection, 3 sites, bilaterally. The first injection was done one half way between the occipital protuberance and the tip of the mastoid process behind the ear. The second injection site was done lateral and superior to the first, 1 fingerbreadth from the first injection. The third injection site was 1 fingerbreadth superiorly and medially from the first injection site.  -Cervical paraspinal muscle injection, 2 sites, bilateral knee first injection site was 1 cm from the midline of the cervical spine, 3 cm inferior to the lower border of the occipital protuberance. The second injection site was 1.5 cm superiorly and laterally to the first injection site.  -Trapezius muscle injection was performed at 3 sites, bilaterally. The first injection site was in the upper trapezius muscle halfway between the inflection point of the neck, and the acromion. The second injection site was one half way between the acromion and the first injection site. The third injection was done between the first injection site and the inflection point of the neck.   Will return for repeat injection in 3 months.   A 155 unit sof Botox was used, 45u Botox not injected was wasted. The patient tolerated the procedure well, there were no complications of the above procedure.

## 2024-01-29 MED ORDER — AJOVY 225 MG/1.5ML ~~LOC~~ SOAJ: 225.0000 mg | SUBCUTANEOUS | 11 refills | Status: AC

## 2024-01-29 NOTE — Addendum Note (Signed)
Addended by: Naomie Dean B on: 01/29/2024 04:00 PM   Modules accepted: Orders

## 2024-02-02 ENCOUNTER — Other Ambulatory Visit (HOSPITAL_COMMUNITY): Payer: Self-pay

## 2024-02-02 ENCOUNTER — Telehealth: Payer: Self-pay

## 2024-02-02 NOTE — Telephone Encounter (Signed)
*  [facility]  Pharmacy Patient Advocate Encounter   Received notification from Fax that prior authorization for AJOVY  (fremanezumab -vfrm) injection 225MG /1.5ML auto-injectors  is required/requested.   Insurance verification completed.   The patient is insured through [facility] .   Per test claim: PA required; PA submitted to above mentioned insurance via CoverMyMeds Key/confirmation #/EOC [id number] Status is pending

## 2024-02-02 NOTE — Telephone Encounter (Signed)
 Denial received due to patient exceeding plan limitations- this may be due to patient having recently filled-closing request as it seems PA is not needed. Per test claim- copay of $24.98

## 2024-02-14 NOTE — Telephone Encounter (Signed)
Previous auth carried over to current plan, pt will still fill through [facility]

## 2024-02-14 NOTE — Telephone Encounter (Signed)
Submitted auth via CMM, status is pending. Key: [id number]

## 2024-02-22 NOTE — Telephone Encounter (Signed)
 [employer] auth#: [id number] (02/21/24-02/19/25)

## 2024-02-29 NOTE — Telephone Encounter (Signed)
 Called [facility] to set up delivery, they did not have pt's new [employer] yet. I relayed info and rep states it will take 24-48 hours to verify new insurance.

## 2024-03-06 ENCOUNTER — Encounter: Payer: Self-pay | Admitting: Neurology

## 2024-03-14 ENCOUNTER — Ambulatory Visit: Payer: BC Managed Care – PPO | Admitting: Neurology

## 2024-03-19 ENCOUNTER — Ambulatory Visit: Admitting: Neurology

## 2024-03-19 ENCOUNTER — Encounter: Payer: Self-pay | Admitting: Neurology

## 2024-03-19 DIAGNOSIS — G43711 Chronic migraine without aura, intractable, with status migrainosus: Secondary | ICD-10-CM | POA: Diagnosis not present

## 2024-03-19 DIAGNOSIS — E66811 Obesity, class 1: Secondary | ICD-10-CM

## 2024-03-19 MED ORDER — ONABOTULINUMTOXINA 100 UNITS IJ SOLR
155.0000 [IU] | Freq: Once | INTRAMUSCULAR | Status: AC
  Administered 2024-03-19: 155 [IU] via INTRAMUSCULAR

## 2024-03-19 MED ORDER — TIRZEPATIDE-WEIGHT MANAGEMENT 2.5 MG/0.5ML ~~LOC~~ SOLN: 2.5000 mg | SUBCUTANEOUS | 0 refills | Status: AC

## 2024-03-19 NOTE — Progress Notes (Signed)
 Consent Form Botulism Toxin Injection For Chronic Migraine 03/19/2024: stable doing well 12/22/2023: stable. Patient gets > 50% improvement in migraine freq and severity but still having 8 migraine days a month and 15 headache days will try and layer ajovy over botox (aimovig contraindicated due to constipatio)    09/26/2023: stable still doing great >>95% improvement 07/04/2023 still doing great, stable 04/11/2023: stable 01/17/2023: stable, doing great 10/21/2023: stable 07/26/2022: stable still doing great >>95% improvement 04/27/2022: Stable still doing great 02/01/2022; stable oding great 11/08/2021: >95% improvement in frequency and severity of migraines     Reviewed orally with patient, additionally signature is on file:  Botulism toxin has been approved by the Federal drug administration for treatment of chronic migraine. Botulism toxin does not cure chronic migraine and it may not be effective in some patients.  The administration of botulism toxin is accomplished by injecting a small amount of toxin into the muscles of the neck and head. Dosage must be titrated for each individual. Any benefits resulting from botulism toxin tend to wear off after 3 months with a repeat injection required if benefit is to be maintained. Injections are usually done every 3-4 months with maximum effect peak achieved by about 2 or 3 weeks. Botulism toxin is expensive and you should be sure of what costs you will incur resulting from the injection.  The side effects of botulism toxin use for chronic migraine may include:   -Transient, and usually mild, facial weakness with facial injections  -Transient, and usually mild, head or neck weakness with head/neck injections  -Reduction or loss of forehead facial animation due to forehead muscle weakness  -Eyelid drooping  -Dry eye  -Pain at the site of injection or bruising at the site of injection  -Double vision  -Potential unknown long term  risks  Contraindications: You should not have Botox if you are pregnant, nursing, allergic to albumin, have an infection, skin condition, or muscle weakness at the site of the injection, or have myasthenia gravis, Lambert-Eaton syndrome, or ALS.  It is also possible that as with any injection, there may be an allergic reaction or no effect from the medication. Reduced effectiveness after repeated injections is sometimes seen and rarely infection at the injection site may occur. All care will be taken to prevent these side effects. If therapy is given over a long time, atrophy and wasting in the muscle injected may occur. Occasionally the patient's become refractory to treatment because they develop antibodies to the toxin. In this event, therapy needs to be modified.  I have read the above information and consent to the administration of botulism toxin.    BOTOX PROCEDURE NOTE FOR MIGRAINE HEADACHE    Contraindications and precautions discussed with patient(above). Aseptic procedure was observed and patient tolerated procedure. Procedure performed by Dr. Artemio Aly  The condition has existed for more than 6 months, and pt does not have a diagnosis of ALS, Myasthenia Gravis or Lambert-Eaton Syndrome.  Risks and benefits of injections discussed and pt agrees to proceed with the procedure.  Written consent obtained  These injections are medically necessary. Pt  receives good benefits from these injections. These injections do not cause sedations or hallucinations which the oral therapies may cause.  Description of procedure:  The patient was placed in a sitting position. The standard protocol was used for Botox as follows, with 5 units of Botox injected at each site:   -Procerus muscle, midline injection  -Corrugator muscle, bilateral injection  -  Frontalis muscle, bilateral injection, with 2 sites each side, medial injection was performed in the upper one third of the frontalis muscle, in  the region vertical from the medial inferior edge of the superior orbital rim. The lateral injection was again in the upper one third of the forehead vertically above the lateral limbus of the cornea, 1.5 cm lateral to the medial injection site.  -Temporalis muscle injection, 4 sites, bilaterally. The first injection was 3 cm above the tragus of the ear, second injection site was 1.5 cm to 3 cm up from the first injection site in line with the tragus of the ear. The third injection site was 1.5-3 cm forward between the first 2 injection sites. The fourth injection site was 1.5 cm posterior to the second injection site.    -Occipitalis muscle injection, 3 sites, bilaterally. The first injection was done one half way between the occipital protuberance and the tip of the mastoid process behind the ear. The second injection site was done lateral and superior to the first, 1 fingerbreadth from the first injection. The third injection site was 1 fingerbreadth superiorly and medially from the first injection site.  -Cervical paraspinal muscle injection, 2 sites, bilateral knee first injection site was 1 cm from the midline of the cervical spine, 3 cm inferior to the lower border of the occipital protuberance. The second injection site was 1.5 cm superiorly and laterally to the first injection site.  -Trapezius muscle injection was performed at 3 sites, bilaterally. The first injection site was in the upper trapezius muscle halfway between the inflection point of the neck, and the acromion. The second injection site was one half way between the acromion and the first injection site. The third injection was done between the first injection site and the inflection point of the neck.   Will return for repeat injection in 3 months.   A 155 unit sof Botox was used, 45u Botox not injected was wasted. The patient tolerated the procedure well, there were no complications of the above procedure.

## 2024-03-19 NOTE — Progress Notes (Signed)
 Botox- 100 units x 2 vial Lot: [id number] Expiration: 04/2026 NDC: 4540-9811-91   Bacteriostatic 0.9% Sodium Chloride- 4 mL  Lot: [id number] Expiration: 10/26/2024 NDC: 6213-0865-78   Dx: I69.629 S/P Witnessed by Janalyn Harder. CMA

## 2024-03-19 NOTE — Patient Instructions (Signed)
 Tirzepatide Injection (Weight Management) What is this medication? TIRZEPATIDE (tir ZEP a tide) promotes weight loss. It may also be used to maintain weight loss.  It works by decreasing appetite. Changes to diet and exercise are often combined with this medication. This medicine may be used for other purposes; ask your health care provider or pharmacist if you have questions. COMMON BRAND NAME(S): Zepbound What should I tell my care team before I take this medication? They need to know if you have any of these conditions: Diabetes Eye disease caused by diabetes Gallbladder disease Have or have had depression Have or have had pancreatitis Having surgery Kidney disease Personal or family history of MEN 2, a condition that causes endocrine gland tumors Personal or family history of thyroid cancer Stomach or intestine problems, such as problems digesting food Suicidal thoughts, plans, or attempt An unusual or allergic reaction to tirzepatide, other medications, foods, dyes, or preservatives Pregnant or trying to get pregnant Breastfeeding How should I use this medication? This medication is injected under the skin. You will be taught how to prepare and give it. Take it as directed on the prescription label. Keep taking it unless your care team tells you to stop. It is important that you put your used needles and syringes in a special sharps container. Do not put them in a trash can. If you do not have a sharps container, call your pharmacist or care team to get one. A special MedGuide will be given to you by the pharmacist with each prescription and refill. Be sure to read this information carefully each time. This medication comes with INSTRUCTIONS FOR USE. Ask your pharmacist for directions on how to use this medication. Read the information carefully. Talk to your pharmacist or care team if you have questions. Talk to your care team about the use of this medication in children. Special care  may be needed. Overdosage: If you think you have taken too much of this medicine contact a poison control center or emergency room at once. NOTE: This medicine is only for you. Do not share this medicine with others. What if I miss a dose? If you miss a dose, take it as soon as you can unless it is more than 4 days (96 hours) late. If it is more than 4 days late, skip the missed dose. Take the next dose at the normal time. Do not take 2 doses within 3 days (72 hours) of each other. What may interact with this medication? Certain medications for diabetes, such as insulin, glyburide, glipizide This medication may affect how other medications work. Talk with your care team about all of the medications you take. They may suggest changes to your treatment plan to lower the risk of side effects and to make sure your medications work as intended. This list may not describe all possible interactions. Give your health care provider a list of all the medicines, herbs, non-prescription drugs, or dietary supplements you use. Also tell them if you smoke, drink alcohol, or use illegal drugs. Some items may interact with your medicine. What should I watch for while using this medication? Visit your care team for regular checks on your progress. Tell your care team if your condition does not start to get better or if it gets worse. Tell your care team if you are taking medication to treat diabetes, such as insulin or glipizide. This may increase your risk of low blood sugar. Know the symptoms of low blood sugar and how to  treat it. Talk to your care team about your risk of cancer. You may be more at risk for certain types of cancer if you take this medication. Talk to your care team right away if you have a lump or swelling in your neck, hoarseness that does not go away, trouble swallowing, shortness of breath, or trouble breathing. Make sure you stay hydrated while taking this medication. Drink water often. Eat fruits  and veggies that have a high water content. Drink more water when it is hot or you are active. Talk to your care team right away if you have fever, infection, vomiting, diarrhea, or if you sweat a lot while taking this medication. The loss of too much body fluid may make it dangerous for you to take this medication. If you are going to need surgery or a procedure, tell your care team that you are taking this medication. Estrogen and progestin hormones that you take by mouth may not work as well while you are taking this medication. Switch to a non-oral contraceptive or add a barrier contraceptive for 4 weeks after starting this medication and after each dose increase. Talk to your care team about contraceptive options. They can help you find the option that works for you. What side effects may I notice from receiving this medication? Side effects that you should report to your care team as soon as possible: Allergic reactions or angioedema--skin rash, itching or hives, swelling of the face, eyes, lips, tongue, arms, or legs, trouble swallowing or breathing Bowel blockage--stomach cramping, unable to have a bowel movement or pass gas, loss of appetite, vomiting Change in vision Dehydration--increased thirst, dry mouth, feeling faint or lightheaded, headache, dark yellow or brown urine Gallbladder problems--severe stomach pain, nausea, vomiting, fever Kidney injury--decrease in the amount of urine, swelling of the ankles, hands, or feet Pancreatitis--severe stomach pain that spreads to your back or gets worse after eating or when touched, fever, nausea, vomiting Thoughts of suicide or self-harm, worsening mood, feelings of depression Thyroid cancer--new mass or lump in the neck, pain or trouble swallowing, trouble breathing, hoarseness Side effects that usually do not require medical attention (report these to your care team if they continue or are bothersome): Diarrhea Loss of appetite Nausea Upset  stomach This list may not describe all possible side effects. Call your doctor for medical advice about side effects. You may report side effects to FDA at 1-800-FDA-1088. Where should I keep my medication? Keep out of the reach of children and pets. Store in a refrigerator or at room temperature up to 30 degrees C (86 degrees F). Keep it in the original container. Protect from light. Refrigeration (preferred): Store in the refrigerator. Do not freeze. Get rid of any unused medication after the expiration date. Room temperature: This medication may be stored at room temperature for up to 21 days. If it is stored at room temperature, get rid of any unused medication after 21 days or after it expires, whichever is first. To get rid of medications that are no longer needed or have expired: Take the medication to a medication take-back program. Check with your pharmacy or law enforcement to find a location. If you cannot return the medication, ask your pharmacist or care team how to get rid of this medication safely. NOTE: This sheet is a summary. It may not cover all possible information. If you have questions about this medicine, talk to your doctor, pharmacist, or health care provider.  2024 Elsevier/Gold Standard (2023-11-24 00:00:00)

## 2024-03-29 ENCOUNTER — Telehealth: Payer: Self-pay

## 2024-03-29 ENCOUNTER — Other Ambulatory Visit (HOSPITAL_COMMUNITY): Payer: Self-pay

## 2024-03-29 NOTE — Telephone Encounter (Signed)
 Pharmacy Patient Advocate Encounter   Received notification from CoverMyMeds that prior authorization for Nurtec 75MG  dispersible tablets is required/requested.   Insurance verification completed.   The patient is insured through [facility] .   Per test claim: PA required; PA submitted to above mentioned insurance via CoverMyMeds Key/confirmation #/EOC [id number] Status is pending

## 2024-03-29 NOTE — Telephone Encounter (Signed)
 Pharmacy Patient Advocate Encounter  Received notification from [facility] that Prior Authorization for Nurtec 75MG  dispersible tablets has been APPROVED from 03/29/2024 to 03/29/2025. Ran test claim, Copay is $0. This test claim was processed through [facility]- copay amounts may vary at other pharmacies due to pharmacy/plan contracts, or as the patient moves through the different stages of their insurance plan.   PA #/Case ID/Reference #: PA Case ID #: [id number]

## 2024-04-18 ENCOUNTER — Ambulatory Visit: Admitting: Neurology

## 2024-06-15 ENCOUNTER — Other Ambulatory Visit (HOSPITAL_BASED_OUTPATIENT_CLINIC_OR_DEPARTMENT_OTHER): Payer: Self-pay | Admitting: Cardiovascular Disease

## 2024-06-18 ENCOUNTER — Ambulatory Visit: Admitting: Neurology

## 2024-06-18 VITALS — BP 119/77 | HR 82

## 2024-06-18 DIAGNOSIS — G43711 Chronic migraine without aura, intractable, with status migrainosus: Secondary | ICD-10-CM | POA: Diagnosis not present

## 2024-06-18 MED ORDER — ONABOTULINUMTOXINA 200 UNITS IJ SOLR
155.0000 [IU] | Freq: Once | INTRAMUSCULAR | Status: AC
  Administered 2024-06-18: 155 [IU] via INTRAMUSCULAR

## 2024-06-18 NOTE — Progress Notes (Unsigned)
 Consent Form Botulism Toxin Injection For Chronic Migraine 06/18/2024: doing great, [facility],  03/19/2024: stable doing well 12/22/2023: stable. Patient gets > 50% improvement in migraine freq and severity but still having 8 migraine days a month and 15 headache days will try and layer ajovy  over botox  (aimovig  contraindicated due to constipatio)    09/26/2023: stable still doing great >>95% improvement 07/04/2023 still doing great, stable 04/11/2023: stable 01/17/2023: stable, doing great 10/21/2023: stable 07/26/2022: stable still doing great >>95% improvement 04/27/2022: Stable still doing great 02/01/2022; stable oding great 11/08/2021: >95% improvement in frequency and severity of migraines     Reviewed orally with patient, additionally signature is on file:  Botulism toxin has been approved by the Federal drug administration for treatment of chronic migraine. Botulism toxin does not cure chronic migraine and it may not be effective in some patients.  The administration of botulism toxin is accomplished by injecting a small amount of toxin into the muscles of the neck and head. Dosage must be titrated for each individual. Any benefits resulting from botulism toxin tend to wear off after 3 months with a repeat injection required if benefit is to be maintained. Injections are usually done every 3-4 months with maximum effect peak achieved by about 2 or 3 weeks. Botulism toxin is expensive and you should be sure of what costs you will incur resulting from the injection.  The side effects of botulism toxin use for chronic migraine may include:   -Transient, and usually mild, facial weakness with facial injections  -Transient, and usually mild, head or neck weakness with head/neck injections  -Reduction or loss of forehead facial animation due to forehead muscle weakness  -Eyelid drooping  -Dry eye  -Pain at the site of injection or bruising at the site of  injection  -Double vision  -Potential unknown long term risks  Contraindications: You should not have Botox  if you are pregnant, nursing, allergic to albumin, have an infection, skin condition, or muscle weakness at the site of the injection, or have myasthenia gravis, Lambert-Eaton syndrome, or ALS.  It is also possible that as with any injection, there may be an allergic reaction or no effect from the medication. Reduced effectiveness after repeated injections is sometimes seen and rarely infection at the injection site may occur. All care will be taken to prevent these side effects. If therapy is given over a long time, atrophy and wasting in the muscle injected may occur. Occasionally the patient's become refractory to treatment because they develop antibodies to the toxin. In this event, therapy needs to be modified.  I have read the above information and consent to the administration of botulism toxin.    BOTOX  PROCEDURE NOTE FOR MIGRAINE HEADACHE    Contraindications and precautions discussed with patient(above). Aseptic procedure was observed and patient tolerated procedure. Procedure performed by Dr. Andree Epp  The condition has existed for more than 6 months, and pt does not have a diagnosis of ALS, Myasthenia Gravis or Lambert-Eaton Syndrome.  Risks and benefits of injections discussed and pt agrees to proceed with the procedure.  Written consent obtained  These injections are medically necessary. Pt  receives good benefits from these injections. These injections do not cause sedations or hallucinations which the oral therapies may cause.  Description of procedure:  The patient was placed in a sitting position. The standard protocol was used for Botox  as follows, with 5 units of Botox  injected at each site:   -Procerus  muscle, midline injection  -Corrugator muscle, bilateral injection  -Frontalis muscle, bilateral injection, with 2 sites each side, medial injection was  performed in the upper one third of the frontalis muscle, in the region vertical from the medial inferior edge of the superior orbital rim. The lateral injection was again in the upper one third of the forehead vertically above the lateral limbus of the cornea, 1.5 cm lateral to the medial injection site.  -Temporalis muscle injection, 4 sites, bilaterally. The first injection was 3 cm above the tragus of the ear, second injection site was 1.5 cm to 3 cm up from the first injection site in line with the tragus of the ear. The third injection site was 1.5-3 cm forward between the first 2 injection sites. The fourth injection site was 1.5 cm posterior to the second injection site.    -Occipitalis muscle injection, 3 sites, bilaterally. The first injection was done one half way between the occipital protuberance and the tip of the mastoid process behind the ear. The second injection site was done lateral and superior to the first, 1 fingerbreadth from the first injection. The third injection site was 1 fingerbreadth superiorly and medially from the first injection site.  -Cervical paraspinal muscle injection, 2 sites, bilateral knee first injection site was 1 cm from the midline of the cervical spine, 3 cm inferior to the lower border of the occipital protuberance. The second injection site was 1.5 cm superiorly and laterally to the first injection site.  -Trapezius muscle injection was performed at 3 sites, bilaterally. The first injection site was in the upper trapezius muscle halfway between the inflection point of the neck, and the acromion. The second injection site was one half way between the acromion and the first injection site. The third injection was done between the first injection site and the inflection point of the neck.   Will return for repeat injection in 3 months.   A 155 unit sof Botox  was used, 45u Botox  not injected was wasted. The patient tolerated the procedure well, there were no  complications of the above procedure.

## 2024-06-18 NOTE — Progress Notes (Unsigned)
 Lot: [id number] Expiration: 09/2026 NDC: 9976-8854-98  Bacteriostatic 0.9% Sodium Chloride - 4 mL  Lot: [id number] Expiration: 09/2025 NDC: 9590-8033-97  Dx: G43.711 S/P  Witnessed by Heather PEAK

## 2024-08-13 ENCOUNTER — Other Ambulatory Visit: Payer: Self-pay | Admitting: *Deleted

## 2024-08-13 DIAGNOSIS — G43009 Migraine without aura, not intractable, without status migrainosus: Secondary | ICD-10-CM

## 2024-08-13 MED ORDER — BOTOX 100 UNITS IJ SOLR: INTRAMUSCULAR | 3 refills | Status: AC

## 2024-09-02 ENCOUNTER — Other Ambulatory Visit: Payer: Self-pay | Admitting: Neurology

## 2024-09-02 DIAGNOSIS — G43009 Migraine without aura, not intractable, without status migrainosus: Secondary | ICD-10-CM

## 2024-09-12 ENCOUNTER — Ambulatory Visit: Admitting: Neurology

## 2024-09-25 ENCOUNTER — Telehealth: Payer: Self-pay | Admitting: Family Medicine

## 2024-09-25 ENCOUNTER — Telehealth: Payer: Self-pay | Admitting: Neurology

## 2024-09-25 NOTE — Telephone Encounter (Signed)
 Pt returned my call about r/s her Botox  with another provider. She accepted an appt with Amy for 10/16 @ 8:00 am. She normally gets her masseters injected. She is aware that Amy will not inject any extra Botox  spots, only the head/neck per migraine protocol and the masseters.

## 2024-09-25 NOTE — Telephone Encounter (Signed)
 LVM and sent mychart msg informing pt of need to reschedule 10/02/24 appt - MD departure

## 2024-10-02 ENCOUNTER — Ambulatory Visit: Admitting: Neurology

## 2024-10-08 NOTE — Progress Notes (Unsigned)
 10/10/24 ALL: Jennifer Sims returns for Botox . Last procedure with Dr Ines 05/2024. She continues Ajovy  and Nurtec. She may have 1-2 milder headaches a month.   06/18/2024: doing great, [facility],   03/19/2024: stable doing well  12/22/2023: stable. Patient gets > 50% improvement in migraine freq and severity but still having 8 migraine days a month and 15 headache days will try and layer ajovy  over botox  (aimovig  contraindicated due to constipatio)    Consent Form Botulism Toxin Injection For Chronic Migraine    Reviewed orally with patient, additionally signature is on file:  Botulism toxin has been approved by the Federal drug administration for treatment of chronic migraine. Botulism toxin does not cure chronic migraine and it may not be effective in some patients.  The administration of botulism toxin is accomplished by injecting a small amount of toxin into the muscles of the neck and head. Dosage must be titrated for each individual. Any benefits resulting from botulism toxin tend to wear off after 3 months with a repeat injection required if benefit is to be maintained. Injections are usually done every 3-4 months with maximum effect peak achieved by about 2 or 3 weeks. Botulism toxin is expensive and you should be sure of what costs you will incur resulting from the injection.  The side effects of botulism toxin use for chronic migraine may include:   -Transient, and usually mild, facial weakness with facial injections  -Transient, and usually mild, head or neck weakness with head/neck injections  -Reduction or loss of forehead facial animation due to forehead muscle weakness  -Eyelid drooping  -Dry eye  -Pain at the site of injection or bruising at the site of injection  -Double vision  -Potential unknown long term risks   Contraindications: You should not have Botox  if you are pregnant, nursing, allergic to albumin, have an infection, skin condition, or muscle  weakness at the site of the injection, or have myasthenia gravis, Lambert-Eaton syndrome, or ALS.  It is also possible that as with any injection, there may be an allergic reaction or no effect from the medication. Reduced effectiveness after repeated injections is sometimes seen and rarely infection at the injection site may occur. All care will be taken to prevent these side effects. If therapy is given over a long time, atrophy and wasting in the muscle injected may occur. Occasionally the patient's become refractory to treatment because they develop antibodies to the toxin. In this event, therapy needs to be modified.  I have read the above information and consent to the administration of botulism toxin.    BOTOX  PROCEDURE NOTE FOR MIGRAINE HEADACHE  Contraindications and precautions discussed with patient(above). Aseptic procedure was observed and patient tolerated procedure. Procedure performed by Greig Forbes, FNP-C.   The condition has existed for more than 6 months, and pt does not have a diagnosis of ALS, Myasthenia Gravis or Lambert-Eaton Syndrome.  Risks and benefits of injections discussed and pt agrees to proceed with the procedure.  Written consent obtained  These injections are medically necessary. Pt  receives good benefits from these injections. These injections do not cause sedations or hallucinations which the oral therapies may cause.   Description of procedure:  The patient was placed in a sitting position. The standard protocol was used for Botox  as follows, with 5 units of Botox  injected at each site:  -Procerus muscle, midline injection  -Corrugator muscle, bilateral injection  -Frontalis muscle, bilateral injection, with 2 sites each side, medial  injection was performed in the upper one third of the frontalis muscle, in the region vertical from the medial inferior edge of the superior orbital rim. The lateral injection was again in the upper one third of the forehead  vertically above the lateral limbus of the cornea, 1.5 cm lateral to the medial injection site.  -Temporalis muscle injection, 4 sites, bilaterally. The first injection was 3 cm above the tragus of the ear, second injection site was 1.5 cm to 3 cm up from the first injection site in line with the tragus of the ear. The third injection site was 1.5-3 cm forward between the first 2 injection sites. The fourth injection site was 1.5 cm posterior to the second injection site. 5th site laterally in the temporalis  muscleat the level of the outer canthus.  -Occipitalis muscle injection, 3 sites, bilaterally. The first injection was done one half way between the occipital protuberance and the tip of the mastoid process behind the ear. The second injection site was done lateral and superior to the first, 1 fingerbreadth from the first injection. The third injection site was 1 fingerbreadth superiorly and medially from the first injection site.  -Cervical paraspinal muscle injection, 2 sites, bilaterally. The first injection site was 1 cm from the midline of the cervical spine, 3 cm inferior to the lower border of the occipital protuberance. The second injection site was 1.5 cm superiorly and laterally to the first injection site.  -Trapezius muscle injection was performed at 3 sites, bilaterally. The first injection site was in the upper trapezius muscle halfway between the inflection point of the neck, and the acromion. The second injection site was one half way between the acromion and the first injection site. The third injection was done between the first injection site and the inflection point of the neck.   Will return for repeat injection in 3 months.   A total of 200 units of Botox  was prepared, 155 units of Botox  was injected as documented above, any Botox  not injected was wasted. The patient tolerated the procedure well, there were no complications of the above procedure.

## 2024-10-10 ENCOUNTER — Ambulatory Visit: Admitting: Family Medicine

## 2024-10-10 VITALS — BP 124/88

## 2024-10-10 DIAGNOSIS — G43709 Chronic migraine without aura, not intractable, without status migrainosus: Secondary | ICD-10-CM

## 2024-10-10 DIAGNOSIS — G43009 Migraine without aura, not intractable, without status migrainosus: Secondary | ICD-10-CM

## 2024-10-10 MED ORDER — ONABOTULINUMTOXINA 100 UNITS IJ SOLR
155.0000 [IU] | Freq: Once | INTRAMUSCULAR | Status: AC
  Administered 2024-10-10: 155 [IU] via INTRAMUSCULAR

## 2024-10-10 NOTE — Progress Notes (Signed)
 Botox - 100 units x 2 vials Lot: [id number] Expiration: 2027/11 NDC: 9976-8854-98  Bacteriostatic 0.9% Sodium Chloride - 4 mL  Lot: [id number] Expiration: 10/31-26 NDC: [id number]  Dx: H56.288  S/P Witnessed by [facility]

## 2024-12-05 ENCOUNTER — Other Ambulatory Visit (HOSPITAL_BASED_OUTPATIENT_CLINIC_OR_DEPARTMENT_OTHER): Payer: Self-pay | Admitting: Cardiovascular Disease

## 2024-12-31 ENCOUNTER — Other Ambulatory Visit: Payer: Self-pay | Admitting: Cardiovascular Disease

## 2025-01-01 MED ORDER — VALSARTAN 80 MG PO TABS: 80.0000 mg | ORAL_TABLET | Freq: Every day | ORAL | 0 refills | Status: AC

## 2025-01-02 ENCOUNTER — Telehealth: Payer: Self-pay | Admitting: Family Medicine

## 2025-01-02 NOTE — Telephone Encounter (Signed)
 Kierra @ [facility] has called re: delivery of Botox  for pt delivery date 01-07-25 address and hours provided, delivery: Botox  100 units SDV

## 2025-01-02 NOTE — Telephone Encounter (Signed)
Updated appt note

## 2025-01-04 ENCOUNTER — Other Ambulatory Visit (HOSPITAL_BASED_OUTPATIENT_CLINIC_OR_DEPARTMENT_OTHER): Payer: Self-pay | Admitting: Cardiovascular Disease

## 2025-01-06 ENCOUNTER — Ambulatory Visit: Admitting: Family Medicine

## 2025-01-14 ENCOUNTER — Ambulatory Visit: Admitting: Family Medicine

## 2025-01-14 ENCOUNTER — Encounter: Payer: Self-pay | Admitting: Family Medicine

## 2025-01-14 DIAGNOSIS — G43009 Migraine without aura, not intractable, without status migrainosus: Secondary | ICD-10-CM

## 2025-01-14 DIAGNOSIS — G43709 Chronic migraine without aura, not intractable, without status migrainosus: Secondary | ICD-10-CM | POA: Diagnosis not present

## 2025-01-14 MED ORDER — ONABOTULINUMTOXINA 100 UNITS IJ SOLR
155.0000 [IU] | Freq: Once | INTRAMUSCULAR | Status: AC
  Administered 2025-01-14: 155 [IU] via INTRAMUSCULAR

## 2025-01-14 NOTE — Progress Notes (Signed)
 "   01/14/25 ALL: Jennifer Sims returns for Botox . She reports doing well. She may have had a few more headaches since last procedure but feels she is well managed, overall. Nurtec helps with abortive therapy.   10/10/2024 ALL: Jennifer Sims returns for Botox . Last procedure with Dr Ines 05/2024. She continues Ajovy  and Nurtec. She may have 1-2 milder headaches a month.   06/18/2024 AA: doing great, gets Ajovy  [facility],   03/19/2024 AA: stable doing well  12/22/2023 AA: stable. Patient gets > 50% improvement in migraine freq and severity but still having 8 migraine days a month and 15 headache days will try and layer ajovy  over botox  (aimovig  contraindicated due to constipatio)    Consent Form Botulism Toxin Injection For Chronic Migraine    Reviewed orally with patient, additionally signature is on file:  Botulism toxin has been approved by the Federal drug administration for treatment of chronic migraine. Botulism toxin does not cure chronic migraine and it may not be effective in some patients.  The administration of botulism toxin is accomplished by injecting a small amount of toxin into the muscles of the neck and head. Dosage must be titrated for each individual. Any benefits resulting from botulism toxin tend to wear off after 3 months with a repeat injection required if benefit is to be maintained. Injections are usually done every 3-4 months with maximum effect peak achieved by about 2 or 3 weeks. Botulism toxin is expensive and you should be sure of what costs you will incur resulting from the injection.  The side effects of botulism toxin use for chronic migraine may include:   -Transient, and usually mild, facial weakness with facial injections  -Transient, and usually mild, head or neck weakness with head/neck injections  -Reduction or loss of forehead facial animation due to forehead muscle weakness  -Eyelid drooping  -Dry eye  -Pain at the site of injection or bruising at the site  of injection  -Double vision  -Potential unknown long term risks   Contraindications: You should not have Botox  if you are pregnant, nursing, allergic to albumin, have an infection, skin condition, or muscle weakness at the site of the injection, or have myasthenia gravis, Lambert-Eaton syndrome, or ALS.  It is also possible that as with any injection, there may be an allergic reaction or no effect from the medication. Reduced effectiveness after repeated injections is sometimes seen and rarely infection at the injection site may occur. All care will be taken to prevent these side effects. If therapy is given over a long time, atrophy and wasting in the muscle injected may occur. Occasionally the patient's become refractory to treatment because they develop antibodies to the toxin. In this event, therapy needs to be modified.  I have read the above information and consent to the administration of botulism toxin.    BOTOX  PROCEDURE NOTE FOR MIGRAINE HEADACHE  Contraindications and precautions discussed with patient(above). Aseptic procedure was observed and patient tolerated procedure. Procedure performed by Greig Forbes, FNP-C.   The condition has existed for more than 6 months, and pt does not have a diagnosis of ALS, Myasthenia Gravis or Lambert-Eaton Syndrome.  Risks and benefits of injections discussed and pt agrees to proceed with the procedure.  Written consent obtained  These injections are medically necessary. Pt  receives good benefits from these injections. These injections do not cause sedations or hallucinations which the oral therapies may cause.   Description of procedure:  The patient was placed in a sitting position.  The standard protocol was used for Botox  as follows, with 5 units of Botox  injected at each site:  -Procerus muscle, midline injection  -Corrugator muscle, bilateral injection  -Frontalis muscle, bilateral injection, with 2 sites each side, medial injection was  performed in the upper one third of the frontalis muscle, in the region vertical from the medial inferior edge of the superior orbital rim. The lateral injection was again in the upper one third of the forehead vertically above the lateral limbus of the cornea, 1.5 cm lateral to the medial injection site.  -Temporalis muscle injection, 4 sites, bilaterally. The first injection was 3 cm above the tragus of the ear, second injection site was 1.5 cm to 3 cm up from the first injection site in line with the tragus of the ear. The third injection site was 1.5-3 cm forward between the first 2 injection sites. The fourth injection site was 1.5 cm posterior to the second injection site. 5th site laterally in the temporalis  muscleat the level of the outer canthus.  -Occipitalis muscle injection, 3 sites, bilaterally. The first injection was done one half way between the occipital protuberance and the tip of the mastoid process behind the ear. The second injection site was done lateral and superior to the first, 1 fingerbreadth from the first injection. The third injection site was 1 fingerbreadth superiorly and medially from the first injection site.  -Cervical paraspinal muscle injection, 2 sites, bilaterally. The first injection site was 1 cm from the midline of the cervical spine, 3 cm inferior to the lower border of the occipital protuberance. The second injection site was 1.5 cm superiorly and laterally to the first injection site.  -Trapezius muscle injection was performed at 3 sites, bilaterally. The first injection site was in the upper trapezius muscle halfway between the inflection point of the neck, and the acromion. The second injection site was one half way between the acromion and the first injection site. The third injection was done between the first injection site and the inflection point of the neck.   Will return for repeat injection in 3 months.   A total of 200 units of Botox  was prepared,  155 units of Botox  was injected as documented above, any Botox  not injected was wasted. The patient tolerated the procedure well, there were no complications of the above procedure.  "

## 2025-01-14 NOTE — Progress Notes (Signed)
 Botox - 100 units x 2 vial Lot: [id number] Expiration: 2027/06 NDC: 9976-8854-98   Bacteriostatic 0.9% Sodium Chloride - 4mL total Lot: [id number] Expiration: 2027-Mar-31 NDC: 9590-8033-97   Dx:  G43.009   SP   Witnessed by: Delon ORN

## 2025-01-21 ENCOUNTER — Encounter: Payer: Self-pay | Admitting: Neurology

## 2025-01-28 ENCOUNTER — Ambulatory Visit: Admitting: Neurology

## 2025-04-01 ENCOUNTER — Ambulatory Visit: Admitting: Neurology

## 2025-04-03 ENCOUNTER — Ambulatory Visit: Admitting: Neurology

## 2025-05-06 ENCOUNTER — Ambulatory Visit: Admitting: Family Medicine
